# Patient Record
Sex: Male | Born: 1950
Health system: Southern US, Community
[De-identification: ages and names within clinical notes are randomized; demographics above are authoritative.]

## PROBLEM LIST (undated history)

## (undated) DIAGNOSIS — F1910 Other psychoactive substance abuse, uncomplicated: Secondary | ICD-10-CM

## (undated) DIAGNOSIS — I10 Essential (primary) hypertension: Secondary | ICD-10-CM

## (undated) DIAGNOSIS — G4733 Obstructive sleep apnea (adult) (pediatric): Secondary | ICD-10-CM

## (undated) DIAGNOSIS — I4891 Unspecified atrial fibrillation: Secondary | ICD-10-CM

## (undated) DIAGNOSIS — E0500 Thyrotoxicosis with diffuse goiter without thyrotoxic crisis or storm: Secondary | ICD-10-CM

## (undated) DIAGNOSIS — I509 Heart failure, unspecified: Secondary | ICD-10-CM

## (undated) DIAGNOSIS — E785 Hyperlipidemia, unspecified: Secondary | ICD-10-CM

## (undated) HISTORY — DX: Thyrotoxicosis with diffuse goiter without thyrotoxic crisis or storm: E05.00

## (undated) HISTORY — PX: APPENDECTOMY: SHX54

## (undated) HISTORY — DX: Heart failure, unspecified: I50.9

## (undated) HISTORY — PX: MENISCUS REPAIR: SHX5179

## (undated) HISTORY — PX: CARDIAC ELECTROPHYSIOLOGY STUDY AND ABLATION: SHX1294

## (undated) HISTORY — DX: Obstructive sleep apnea (adult) (pediatric): G47.33

## (undated) HISTORY — PX: VASECTOMY: SHX75

## (undated) HISTORY — PX: HERNIA REPAIR: SHX51

## (undated) HISTORY — DX: Essential (primary) hypertension: I10

## (undated) HISTORY — DX: Unspecified atrial fibrillation: I48.91

## (undated) HISTORY — DX: Hyperlipidemia, unspecified: E78.5

## (undated) HISTORY — DX: Other psychoactive substance abuse, uncomplicated: F19.10

---

## 2010-12-13 HISTORY — PX: BIV ICD GENERTAOR CHANGE OUT: SHX5745

## 2014-07-15 ENCOUNTER — Encounter: Payer: Self-pay | Admitting: Cardiovascular Disease

## 2014-07-15 ENCOUNTER — Ambulatory Visit (INDEPENDENT_AMBULATORY_CARE_PROVIDER_SITE_OTHER): Admitting: Cardiovascular Disease

## 2014-07-15 ENCOUNTER — Ambulatory Visit (INDEPENDENT_AMBULATORY_CARE_PROVIDER_SITE_OTHER): Admitting: *Deleted

## 2014-07-15 VITALS — BP 102/72 | HR 86 | Ht 73.0 in | Wt 303.2 lb

## 2014-07-15 DIAGNOSIS — Z720 Tobacco use: Secondary | ICD-10-CM

## 2014-07-15 DIAGNOSIS — I4891 Unspecified atrial fibrillation: Secondary | ICD-10-CM

## 2014-07-15 DIAGNOSIS — I4821 Permanent atrial fibrillation: Secondary | ICD-10-CM

## 2014-07-15 DIAGNOSIS — Z95810 Presence of automatic (implantable) cardiac defibrillator: Secondary | ICD-10-CM

## 2014-07-15 DIAGNOSIS — F1021 Alcohol dependence, in remission: Secondary | ICD-10-CM

## 2014-07-15 DIAGNOSIS — I5042 Chronic combined systolic (congestive) and diastolic (congestive) heart failure: Secondary | ICD-10-CM | POA: Insufficient documentation

## 2014-07-15 DIAGNOSIS — I420 Dilated cardiomyopathy: Secondary | ICD-10-CM

## 2014-07-15 DIAGNOSIS — I442 Atrioventricular block, complete: Secondary | ICD-10-CM

## 2014-07-15 DIAGNOSIS — Z79899 Other long term (current) drug therapy: Secondary | ICD-10-CM

## 2014-07-15 DIAGNOSIS — I428 Other cardiomyopathies: Secondary | ICD-10-CM

## 2014-07-15 DIAGNOSIS — I509 Heart failure, unspecified: Secondary | ICD-10-CM

## 2014-07-15 DIAGNOSIS — E890 Postprocedural hypothyroidism: Secondary | ICD-10-CM | POA: Insufficient documentation

## 2014-07-15 DIAGNOSIS — E782 Mixed hyperlipidemia: Secondary | ICD-10-CM

## 2014-07-15 DIAGNOSIS — E785 Hyperlipidemia, unspecified: Secondary | ICD-10-CM

## 2014-07-15 DIAGNOSIS — F17200 Nicotine dependence, unspecified, uncomplicated: Secondary | ICD-10-CM

## 2014-07-15 DIAGNOSIS — I480 Paroxysmal atrial fibrillation: Secondary | ICD-10-CM

## 2014-07-15 DIAGNOSIS — F1020 Alcohol dependence, uncomplicated: Secondary | ICD-10-CM

## 2014-07-15 DIAGNOSIS — Z9989 Dependence on other enabling machines and devices: Secondary | ICD-10-CM

## 2014-07-15 DIAGNOSIS — G4733 Obstructive sleep apnea (adult) (pediatric): Secondary | ICD-10-CM

## 2014-07-15 NOTE — Assessment & Plan Note (Signed)
He reports compliance with therapy.

## 2014-07-15 NOTE — Assessment & Plan Note (Signed)
He tells me that he has a scheduled appointment with an endocrinologist here in town and would like him to check his thyroid function tests. We will check his other baseline laboratory tests including an updated lipid profile, liver function tests and renal function

## 2014-07-15 NOTE — Assessment & Plan Note (Signed)
He is pacemaker dependent status post AV node ablation.

## 2014-07-15 NOTE — Assessment & Plan Note (Signed)
Appears to be very well compensated. No clinical evidence of hypervolemia, NYHA functional class IB. If his left ventricular ejection fraction has really improved so dramatically, I see little purpose for the combination of digoxin, especially since it has no value for ventricular rate control following AV node ablation. We'll try to obtain more complete records from [place] and will probably recommend discontinuation of this drug at his next appointment. He requires a low dose of loop diuretic for compensation and has not had acute heart failure exacerbation requiring dose adjustment her hospitalization.

## 2014-07-15 NOTE — Assessment & Plan Note (Signed)
Fairly noncommittal, he stated that he does not have a plan to quit smoking. When asked why the answer was my lungs are great". We'll have to slowly try to convince him of the importance of smoking cessation over his next visits.

## 2014-07-15 NOTE — Assessment & Plan Note (Signed)
Excellent LDL on current statin therapy. We will not see improvement in HDL without substantial weight loss.

## 2014-07-15 NOTE — Patient Instructions (Signed)
Your physician has requested that you have an echocardiogram. Echocardiography is a painless test that uses sound waves to create images of your heart. It provides your doctor with information about the size and shape of your heart and how well your heart's chambers and valves are working. This procedure takes approximately one hour. There are no restrictions for this procedure.  Your physician recommends that you return for lab work in: [place] at your convenience.  Dr. Sallyanne Kuster recommends that you schedule a follow-up appointment in: 6 MONTHS.

## 2014-07-15 NOTE — Assessment & Plan Note (Signed)
>>  ASSESSMENT AND PLAN FOR HYPOTHYROIDISM, POSTRADIOIODINE THERAPY WRITTEN ON 07/15/2014 10:57 PM BY Thurmon FairROITORU, MIHAI, MD  He tells me that he has a scheduled appointment with an endocrinologist here in town and would like him to check his thyroid function tests. We will check his other baseline laboratory tests including an updated lipid profile, liver function tests and renal function

## 2014-07-15 NOTE — Assessment & Plan Note (Signed)
>>  ASSESSMENT AND PLAN FOR DYSLIPIDEMIA (HIGH LDL; LOW HDL) WRITTEN ON 07/15/2014 10:56 PM BY CROITORU, MIHAI, MD  Excellent LDL on current statin therapy. We will not see improvement in HDL without substantial weight loss.

## 2014-07-15 NOTE — Progress Notes (Signed)
Patient ID: Brian Craig, male   DOB: Aug 08, 1951, 63 y.o.   MRN: [id number]      Reason for office visit Dilated cardiomyopathy, permanent atrial fibrillation, status post AV node ablation, CRT-D  [name] has recently relocated to [place] from [place]. He previously received cardiology care at [facility] where he was seen by Dr. Randol Kern (EP) and Dr. Lanelle Bal. 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  He describes his initial problem being Graves' disease from which he developed atrial fibrillation. His atrial fibrillation was difficult to control. He developed cardiomyopathy and he states that at one point his left ventricular ejection fraction was only 17% he had an unsuccessful cardioversion. Then, he underwent radioactive iodine thyroid ablation and then had another attempt at cardioversion which was also unsuccessful.   He received a CRT-D device in December 4315 (Medtronic Nixon, atrial lead Medtronic 5076, ICD lead Medtronic [phone] Sprint Quattro secure, LV lead Medtronic 4194 Attain bipolar). He underwent a generator change out in March 2012 for a Medtronic protecta [id number] device. The patient's notes describe him as having "excellent RV pacing threshold and progressive exit block in the left ventricular lead"  In 2009 he underwent AV node ablation. The notes say that his ejection fraction subsequently improved to 50-55%. [name] was that his most recent EF was 49% and that his last echocardiogram was performed roughly one year ago. His last notes from 2014 describing as having NYHA functional class IB-IIa. His cardiac medications including maximum dose of Coreg CR, moderate dose of ACE inhibitor (lisinopril 20), digoxin and he is on anticoagulation with Pradaxa.  He has obstructive sleep apnea compliant with treatment with VPAP. He takes medication for  depression, hyperlipidemia and erectile dysfunction as well as a thyroid supplement following his thyroid ablation. He does not have a history of stroke/TIA or other embolic events and does not have a history of bleeding complications.  He is a former alcoholic but still goes to [employer] and has been completely sober since 1984. He smokes roughly half a pack of cigarettes a day and has no intention of quitting  Interrogation of his CRT-D device today shows normal function. Battery voltage is 2.84 V (RRT 2.63 V) the last charge time of 11.2 seconds on March 21. The original leads are still in place. Atrial lead impedance 437 ohm, with the patient in permanent atrial fibrillation, sensed to the store he weighs 1.9 mV. Ventricular lead impedance 456 (high voltage 42/55) right ventricular threshold 0.75 V at 0.4 ms. Left ventricular lead impedance 646 ohms, threshold 1.25 V at 0.8 ms.  Patient activity appears to be constant over last year at approximately 2.5-3 hours a day. There is 98.5% biventricular pacing. There is 1.5 ventricular sensing. He is in permanent atrial fibrillation and there are no episodes of nonsustained or sustained ventricular tachycardia. I suspect the remainder of the 1.5% ventricular sensed events has to represent PVCs. There is no ventricular escape rhythm and pacing was taken down to 30 beats per minute.   No Known Allergies  Current Outpatient Prescriptions  Medication Sig Dispense Refill  . ALPRAZolam (XANAX) 0.5 MG tablet Take 0.5 mg by mouth 3 (three) times daily as needed for anxiety.      . carvedilol (COREG CR) 80 MG 24 hr capsule Take 80 mg by mouth daily.      . dabigatran (PRADAXA) 150 MG CAPS  capsule Take 150 mg by mouth 2 (two) times daily.      . digoxin (LANOXIN) 0.25 MG tablet Take 0.25 mg by mouth daily.      . furosemide (LASIX) 40 MG tablet Take 40 mg by mouth daily.      Marland Kitchen levothyroxine (SYNTHROID, LEVOTHROID) 200 MCG tablet Take 200 mcg by mouth daily  before breakfast.      . lisinopril (PRINIVIL,ZESTRIL) 10 MG tablet Take 10 mg by mouth 2 (two) times daily.      . Multiple Vitamin (MULTIVITAMIN) tablet Take 1 tablet by mouth daily.      . potassium chloride (K-DUR,KLOR-CON) 10 MEQ tablet Take 10 mEq by mouth daily.      . sertraline (ZOLOFT) 50 MG tablet Take 50 mg by mouth daily.      . simvastatin (ZOCOR) 40 MG tablet Take 40 mg by mouth daily.      . vardenafil (LEVITRA) 10 MG tablet Take 10 mg by mouth daily as needed for erectile dysfunction.       No current facility-administered medications for this visit.    Past Medical History  Diagnosis Date  . CHF (congestive heart failure)   . OSA treated with BiPAP   . Graves disease     Past Surgical History  Procedure Laterality Date  . Biv icd genertaor change out Left 2012    Medtronic Protecta    Family History  Problem Relation Age of Onset  . Heart disease Mother   . Heart disease Father   . Heart disease Paternal Aunt   . Heart disease Paternal Uncle     History   Social History  . Marital Status: Married    Spouse Name: N/A    Number of Children: N/A  . Years of Education: N/A   Occupational History  . Not on file.   Social History Main Topics  . Smoking status: Current Every Day Smoker -- 0.50 packs/day    Types: Cigarettes  . Smokeless tobacco: Not on file  . Alcohol Use: No  . Drug Use: No  . Sexual Activity: Not on file   Other Topics Concern  . Not on file   Social History Narrative  . No narrative on file    Review of systems: The patient specifically denies any chest pain at rest or with exertion, dyspnea at rest or with exertion, orthopnea, paroxysmal nocturnal dyspnea, syncope, palpitations, focal neurological deficits, intermittent claudication, lower extremity edema, unexplained weight gain, cough, hemoptysis or wheezing.  The patient also denies abdominal pain, nausea, vomiting, dysphagia, diarrhea, constipation, polyuria, polydipsia,  dysuria, hematuria, frequency, urgency, abnormal bleeding or bruising, fever, chills, unexpected weight changes, mood swings, change in skin or hair texture, change in voice quality, auditory or visual problems, allergic reactions or rashes, new musculoskeletal complaints other than usual "aches and pains".   PHYSICAL EXAM BP 102/72  Pulse 86  Ht 6\' 1"  (1.854 m)  Wt 303 lb 3.2 oz (137.531 kg)  BMI 40.01 kg/m2  General: Alert, oriented x3, no distress Head: no evidence of trauma, PERRL, EOMI, no exophtalmos or lid lag, no myxedema, no xanthelasma; normal ears, nose and oropharynx Neck: normal jugular venous pulsations and no hepatojugular reflux; brisk carotid pulses without delay and no carotid bruits Chest: clear to auscultation, no signs of consolidation by percussion or palpation, normal fremitus, symmetrical and full respiratory excursions, healthy left subclavian defibrillator site Cardiovascular: normal position and quality of the apical impulse, regular rhythm, normal first and fairly narrow  split second heart sounds, no murmurs, rubs or gallops Abdomen: no tenderness or distention, no masses by palpation, no abnormal pulsatility or arterial bruits, normal bowel sounds, no hepatosplenomegaly Extremities: no clubbing, cyanosis or edema; 2+ radial, ulnar and brachial pulses bilaterally; 2+ right femoral, posterior tibial and dorsalis pedis pulses; 2+ left femoral, posterior tibial and dorsalis pedis pulses; no subclavian or femoral bruits Neurological: grossly nonfocal   EKG: Atrial fibrillation, complete heart block, 100% ventricular pacing  Lipid Panel April 2015 Cholesterol 103, triglycerides 138, HDL 31, LDL 44  CMET April 2015 Sodium 140, potassium 4.4, glucose 86, creatinine 1.1, normal liver function tests Hemoglobin 14.8, platelets 204, white blood cell count 8.5   ASSESSMENT AND PLAN Chronic combined systolic and diastolic CHF, NYHA class 1 Appears to be very well  compensated. No clinical evidence of hypervolemia, NYHA functional class IB. If his left ventricular ejection fraction has really improved so dramatically, I see little purpose for the combination of digoxin, especially since it has no value for ventricular rate control following AV node ablation. We'll try to obtain more complete records from [place] and will probably recommend discontinuation of this drug at his next appointment. He requires a low dose of loop diuretic for compensation and has not had acute heart failure exacerbation requiring dose adjustment her hospitalization.  CHB (complete heart block) He is pacemaker dependent status post AV node ablation.  Congestive dilated cardiomyopathy I think it is important to reassess left ventricular ejection fraction and have a baseline echocardiogram here in [place]  Biventricular ICD  implanted 2007, generator change in 2012 Normal device function. No history of delivered appropriate or inappropriate defibrillator therapy. Fairly good percentage of biventricular pacing. No pertinent reprogramming her to be necessary today. Enrolled in [facility].  OSA on CPAP He reports compliance with therapy.  Tobacco abuse Fairly noncommittal, he stated that he does not have a plan to quit smoking. When asked why the answer was my lungs are great". We'll have to slowly try to convince him of the importance of smoking cessation over his next visits.  Dyslipidemia (high LDL; low HDL) Excellent LDL on current statin therapy. We will not see improvement in HDL without substantial weight loss.  Morbid obesity He is a tall and very large framed gentleman with strong bones and muscles but is also clearly obese. Weight loss would be beneficial.  Hypothyroidism, postradioiodine therapy He tells me that he has a scheduled appointment with an endocrinologist here in town and would like him to check his thyroid function tests. We will check his other baseline  laboratory tests including an updated lipid profile, liver function tests and renal function  Alcoholism in recovery He is congratulated for more than 30 years of abstinence.   Orders Placed This Encounter  Procedures  . Comprehensive metabolic panel  . Lipid panel  . EKG 12-Lead  . 2D Echocardiogram without contrast   Meds ordered this encounter  Medications  . potassium chloride (K-DUR,KLOR-CON) 10 MEQ tablet    Sig: Take 10 mEq by mouth daily.  . furosemide (LASIX) 40 MG tablet    Sig: Take 40 mg by mouth daily.  . sertraline (ZOLOFT) 50 MG tablet    Sig: Take 50 mg by mouth daily.  . simvastatin (ZOCOR) 40 MG tablet    Sig: Take 40 mg by mouth daily.  . carvedilol (COREG CR) 80 MG 24 hr capsule    Sig: Take 80 mg by mouth daily.  Marland Kitchen lisinopril (PRINIVIL,ZESTRIL) 10 MG tablet  Sig: Take 10 mg by mouth 2 (two) times daily.  . digoxin (LANOXIN) 0.25 MG tablet    Sig: Take 0.25 mg by mouth daily.  . dabigatran (PRADAXA) 150 MG CAPS capsule    Sig: Take 150 mg by mouth 2 (two) times daily.  Marland Kitchen ALPRAZolam (XANAX) 0.5 MG tablet    Sig: Take 0.5 mg by mouth 3 (three) times daily as needed for anxiety.  . vardenafil (LEVITRA) 10 MG tablet    Sig: Take 10 mg by mouth daily as needed for erectile dysfunction.  Marland Kitchen levothyroxine (SYNTHROID, LEVOTHROID) 200 MCG tablet    Sig: Take 200 mcg by mouth daily before breakfast.  . Multiple Vitamin (MULTIVITAMIN) tablet    Sig: Take 1 tablet by mouth daily.    Holli Humbles, MD, [place] [phone] office [phone] pager

## 2014-07-15 NOTE — Assessment & Plan Note (Signed)
He is congratulated for more than 30 years of abstinence.

## 2014-07-15 NOTE — Assessment & Plan Note (Signed)
He is a tall and very large framed gentleman with strong bones and muscles but is also clearly obese. Weight loss would be beneficial.

## 2014-07-15 NOTE — Assessment & Plan Note (Signed)
Normal device function. No history of delivered appropriate or inappropriate defibrillator therapy. Fairly good percentage of biventricular pacing. No pertinent reprogramming her to be necessary today. Enrolled in [facility].

## 2014-07-15 NOTE — Assessment & Plan Note (Signed)
I think it is important to reassess left ventricular ejection fraction and have a baseline echocardiogram here in [place]

## 2014-07-19 LAB — MDC_IDC_ENUM_SESS_TYPE_INCLINIC
Battery Voltage: 2.84 V
Brady Statistic AP VP Percent: 0 %
Brady Statistic AP VS Percent: 0 %
Brady Statistic AS VP Percent: 98.49 %
Brady Statistic AS VS Percent: 1.51 %
Brady Statistic RA Percent Paced: 0 %
Brady Statistic RV Percent Paced: 98.49 %
HIGH POWER IMPEDANCE MEASURED VALUE: 437 Ohm
HighPow Impedance: 171 Ohm
HighPow Impedance: 42 Ohm
HighPow Impedance: 55 Ohm
Lead Channel Impedance Value: 342 Ohm
Lead Channel Impedance Value: 437 Ohm
Lead Channel Impedance Value: 456 Ohm
Lead Channel Impedance Value: 817 Ohm
Lead Channel Pacing Threshold Amplitude: 0.75 V
Lead Channel Pacing Threshold Pulse Width: 0.8 ms
Lead Channel Sensing Intrinsic Amplitude: 1.625 mV
Lead Channel Sensing Intrinsic Amplitude: 5.875 mV
Lead Channel Setting Pacing Amplitude: 2.25 V
Lead Channel Setting Pacing Amplitude: 2.5 V
MDC IDC MSMT LEADCHNL LV IMPEDANCE VALUE: 646 Ohm
MDC IDC MSMT LEADCHNL LV PACING THRESHOLD AMPLITUDE: 1.25 V
MDC IDC MSMT LEADCHNL RA SENSING INTR AMPL: 1.875 mV
MDC IDC MSMT LEADCHNL RV PACING THRESHOLD PULSEWIDTH: 0.4 ms
MDC IDC SESS DTM: 20150803190121
MDC IDC SET LEADCHNL LV PACING PULSEWIDTH: 0.8 ms
MDC IDC SET LEADCHNL RV PACING PULSEWIDTH: 0.4 ms
MDC IDC SET LEADCHNL RV SENSING SENSITIVITY: 0.3 mV
MDC IDC SET ZONE DETECTION INTERVAL: 450 ms
Zone Setting Detection Interval: 300 ms
Zone Setting Detection Interval: 350 ms
Zone Setting Detection Interval: 400 ms

## 2014-07-19 NOTE — Progress Notes (Addendum)
CRT-D device check in office. Thresholds consistent with previous device measurements. Lead impedance trends stable over time. Permanent AF + Pradaxa. No ventricular arrhythmia episodes recorded. Patient bi-ventricularly pacing 98.5% of the time. Device programmed with appropriate safety margins. Audible alerts demonstrated for patient. No changes made this session. Batt voltage 2.84V (ERI 2.63V).  Patient will follow up via [facility] on 11-4 and with [facility] in 12 months.

## 2014-07-22 ENCOUNTER — Ambulatory Visit (HOSPITAL_COMMUNITY)
Admission: RE | Admit: 2014-07-22 | Discharge: 2014-07-22 | Disposition: A | Discharge status: Home / Self Care | Source: Ambulatory Visit | Attending: Cardiovascular Disease | Admitting: Cardiovascular Disease

## 2014-07-22 DIAGNOSIS — I369 Nonrheumatic tricuspid valve disorder, unspecified: Secondary | ICD-10-CM

## 2014-07-22 DIAGNOSIS — I4891 Unspecified atrial fibrillation: Secondary | ICD-10-CM | POA: Insufficient documentation

## 2014-07-22 DIAGNOSIS — I480 Paroxysmal atrial fibrillation: Secondary | ICD-10-CM

## 2014-07-22 LAB — COMPREHENSIVE METABOLIC PANEL
ALT: 15 U/L (ref 0–53)
AST: 15 U/L (ref 0–37)
Albumin: 4.3 g/dL (ref 3.5–5.2)
Alkaline Phosphatase: 72 U/L (ref 39–117)
BILIRUBIN TOTAL: 0.7 mg/dL (ref 0.2–1.2)
BUN: 8 mg/dL (ref 6–23)
CALCIUM: 9.4 mg/dL (ref 8.4–10.5)
CHLORIDE: 102 meq/L (ref 96–112)
CO2: 30 mEq/L (ref 19–32)
Creat: 0.93 mg/dL (ref 0.50–1.35)
GLUCOSE: 87 mg/dL (ref 70–99)
Potassium: 4 mEq/L (ref 3.5–5.3)
Sodium: 139 mEq/L (ref 135–145)
Total Protein: 6.7 g/dL (ref 6.0–8.3)

## 2014-07-22 LAB — LIPID PANEL
CHOL/HDL RATIO: 3.7 ratio
CHOLESTEROL: 110 mg/dL (ref 0–200)
HDL: 30 mg/dL — AB (ref >39)
LDL Cholesterol: 53 mg/dL (ref 0–99)
TRIGLYCERIDES: 134 mg/dL (ref <150)
VLDL: 27 mg/dL (ref 0–40)

## 2014-07-22 NOTE — Progress Notes (Signed)
2D Echocardiogram Complete.  07/22/2014   Cassondra Stachowski Buckhead, RDCS

## 2014-08-26 ENCOUNTER — Encounter: Payer: Self-pay | Admitting: Cardiovascular Disease

## 2014-10-21 ENCOUNTER — Ambulatory Visit (INDEPENDENT_AMBULATORY_CARE_PROVIDER_SITE_OTHER): Admitting: *Deleted

## 2014-10-21 DIAGNOSIS — I420 Dilated cardiomyopathy: Secondary | ICD-10-CM

## 2014-10-21 DIAGNOSIS — I5042 Chronic combined systolic (congestive) and diastolic (congestive) heart failure: Secondary | ICD-10-CM

## 2014-10-21 NOTE — Progress Notes (Signed)
Remote ICD transmission.   

## 2014-10-23 LAB — MDC_IDC_ENUM_SESS_TYPE_REMOTE
Battery Voltage: 2.71 V
Brady Statistic AP VP Percent: 0 %
Brady Statistic AP VS Percent: 0 %
Brady Statistic AS VP Percent: 99.23 %
Brady Statistic AS VS Percent: 0.77 %
HIGH POWER IMPEDANCE MEASURED VALUE: 342 Ohm
HIGH POWER IMPEDANCE MEASURED VALUE: 38 Ohm
HighPow Impedance: 47 Ohm
Lead Channel Impedance Value: 323 Ohm
Lead Channel Impedance Value: 646 Ohm
Lead Channel Setting Pacing Amplitude: 2.25 V
Lead Channel Setting Pacing Amplitude: 2.5 V
Lead Channel Setting Pacing Pulse Width: 0.4 ms
Lead Channel Setting Pacing Pulse Width: 0.8 ms
Lead Channel Setting Sensing Sensitivity: 0.3 mV
MDC IDC MSMT LEADCHNL LV IMPEDANCE VALUE: 456 Ohm
MDC IDC MSMT LEADCHNL RA IMPEDANCE VALUE: 399 Ohm
MDC IDC MSMT LEADCHNL RA SENSING INTR AMPL: 0.875 mV
MDC IDC MSMT LEADCHNL RV IMPEDANCE VALUE: 342 Ohm
MDC IDC SESS DTM: 20151109152207
MDC IDC SET ZONE DETECTION INTERVAL: 300 ms
MDC IDC STAT BRADY RA PERCENT PACED: 0 %
MDC IDC STAT BRADY RV PERCENT PACED: 99.23 %
Zone Setting Detection Interval: 350 ms
Zone Setting Detection Interval: 400 ms
Zone Setting Detection Interval: 450 ms

## 2014-11-01 ENCOUNTER — Encounter: Payer: Self-pay | Admitting: Cardiology

## 2014-11-06 ENCOUNTER — Encounter: Payer: Self-pay | Admitting: Cardiovascular Disease

## 2015-01-30 ENCOUNTER — Telehealth: Payer: Self-pay | Admitting: Cardiovascular Disease

## 2015-01-30 MED ORDER — LISINOPRIL 10 MG PO TABS: 10.0000 mg | ORAL_TABLET | Freq: Two times a day (BID) | ORAL | Status: DC

## 2015-01-30 NOTE — Telephone Encounter (Signed)
Rx refilled.

## 2015-01-30 NOTE — Telephone Encounter (Signed)
°  1. Which medications need to be refilled?Lisinopril 10mg   2. Which pharmacy is medication to be sent to?[facility]   3. Do they need a 30 day or 90 day supply? 90  4. Would they like a call back once the medication has been sent to the pharmacy? no

## 2015-03-10 ENCOUNTER — Encounter: Payer: Self-pay | Admitting: Cardiovascular Disease

## 2015-03-10 ENCOUNTER — Ambulatory Visit (INDEPENDENT_AMBULATORY_CARE_PROVIDER_SITE_OTHER): Admitting: Cardiovascular Disease

## 2015-03-10 VITALS — BP 118/77 | HR 83 | Resp 16 | Ht 73.0 in | Wt 319.4 lb

## 2015-03-10 DIAGNOSIS — Z95810 Presence of automatic (implantable) cardiac defibrillator: Secondary | ICD-10-CM | POA: Diagnosis not present

## 2015-03-10 DIAGNOSIS — I442 Atrioventricular block, complete: Secondary | ICD-10-CM | POA: Diagnosis not present

## 2015-03-10 DIAGNOSIS — I482 Chronic atrial fibrillation: Secondary | ICD-10-CM

## 2015-03-10 DIAGNOSIS — I4821 Permanent atrial fibrillation: Secondary | ICD-10-CM

## 2015-03-10 DIAGNOSIS — I5042 Chronic combined systolic (congestive) and diastolic (congestive) heart failure: Secondary | ICD-10-CM | POA: Diagnosis not present

## 2015-03-10 NOTE — Progress Notes (Signed)
Patient ID: Brian Craig, male   DOB: 12-03-1951, 64 y.o.   MRN: [id number]     Cardiology Office Note   Date:  03/10/2015   ID:  Brian Craig, DOB December 08, 1951, MRN [id number]  PCP:  Gerilyn Pilgrim, FNP  Cardiologist:   Sanda Klein, MD   Chief Complaint  Patient presents with  . Follow-up    6 months:  No complaints of chest pain, SOB or edema.  Occas.  positional dizziness.      History of Present Illness: Brian Craig is a 64 y.o. male who presents for Dilated cardiomyopathy, permanent atrial fibrillation, status post AV node ablation, CRT-D  Brian Craig has a history of severe dilated cardiomyopathy related to uncontrolled atrial fibrillation with rapid ventricular response that started in the setting of thyrotoxicosis. His left ventricular ejection fraction was as low as 17%. Cardioversion failed. Eventually underwent AV node ablation and received a biventricular pacemaker-defibrillator in 2007. He underwent a generator change out in 2012 (atrial lead Medtronic 5076, ICD lead Medtronic [phone] Sprint Quattro secure, LV lead Medtronic 4194 Attain bipolar, Medtronic protecta D334TRG device). His left ventricular ejection fraction has improved substantially since AV node ablation. His most recent echocardiogram performed in August 2015 shows normal left ventricular systolic function. He remains in permanent atrial fibrillation.  He has no complaints whatsoever today. He exercises with some regularity. He rarely develops worsening edema and takes an extra furosemide no more than once or twice every 6 months.  Interrogation of his device shows that it has almost reached elective replacement indicator. Battery voltage is 2.63 V, right at the RRT threshold. He has 99% biventricular pacing. He performs remote  He has obstructive sleep apnea compliant with treatment with CPAP. He takes medication for depression, hyperlipidemia and erectile dysfunction as well as a thyroid supplement following his  thyroid ablation. He does not have a history of stroke/TIA or other embolic events and does not have a history of bleeding complications.  Brian Craig has recently relocated to [place] from [place]. He previously received cardiology care at [facility] cardiology where he was seen by Dr. Randol Kern (EP) and Dr. Lanelle Bal. 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 He is a former alcoholic but still goes to [employer] and has been completely sober since 1984. He smokes roughly half a pack of cigarettes a day and has no intention of quitting    Past Medical History  Diagnosis Date  . CHF (congestive heart failure)   . OSA treated with BiPAP   . Graves disease     Past Surgical History  Procedure Laterality Date  . Biv icd genertaor change out Left 2012    Medtronic Protecta     Current Outpatient Prescriptions  Medication Sig Dispense Refill  . ALPRAZolam (XANAX) 0.5 MG tablet Take 0.5 mg by mouth 3 (three) times daily as needed for anxiety.    . carvedilol (COREG CR) 80 MG 24 hr capsule Take 80 mg by mouth daily.    . dabigatran (PRADAXA) 150 MG CAPS capsule Take 150 mg by mouth 2 (two) times daily.    . digoxin (LANOXIN) 0.25 MG tablet Take 0.25 mg by mouth daily.    . furosemide (LASIX) 40 MG tablet Take 40 mg by mouth daily.    Marland Kitchen levothyroxine (SYNTHROID, LEVOTHROID) 200 MCG tablet Take 200 mcg by mouth daily before breakfast.    . lisinopril (PRINIVIL,ZESTRIL) 10  MG tablet Take 1 tablet (10 mg total) by mouth 2 (two) times daily. 180 tablet 1  . Multiple Vitamin (MULTIVITAMIN) tablet Take 1 tablet by mouth daily.    . potassium chloride (K-DUR,KLOR-CON) 10 MEQ tablet Take 10 mEq by mouth daily.    . sertraline (ZOLOFT) 50 MG tablet Take 50 mg by mouth daily.    . simvastatin (ZOCOR) 40 MG tablet Take 40 mg by mouth daily.    . vardenafil (LEVITRA) 10 MG tablet Take 10  mg by mouth daily as needed for erectile dysfunction.     No current facility-administered medications for this visit.    Allergies:   Review of patient's allergies indicates no known allergies.    Social History:  The patient  reports that he has been smoking Cigarettes.  He has been smoking about 0.50 packs per day. He does not have any smokeless tobacco history on file. He reports that he does not drink alcohol or use illicit drugs.   Family History:  The patient's family history includes Heart disease in his father, mother, paternal aunt, and paternal uncle.    ROS:  Please see the history of present illness.    The patient specifically denies any chest pain at rest or with exertion, dyspnea at rest or with exertion, orthopnea, paroxysmal nocturnal dyspnea, syncope, palpitations, focal neurological deficits, intermittent claudication, lower extremity edema, unexplained weight gain, cough, hemoptysis or wheezing.  The patient also denies abdominal pain, nausea, vomiting, dysphagia, diarrhea, constipation, polyuria, polydipsia, dysuria, hematuria, frequency, urgency, abnormal bleeding or bruising, fever, chills, unexpected weight changes, mood swings, change in skin or hair texture, change in voice quality, auditory or visual problems, allergic reactions or rashes, new musculoskeletal complaints other than usual "aches and pains".  Otherwise, review of systems positive for none.   All other systems are reviewed and negative.    PHYSICAL EXAM: VS:  BP 118/77 mmHg  Pulse 83  Resp 16  Ht 6\' 1"  (1.854 m)  Wt 319 lb 6.4 oz (144.879 kg)  BMI 42.15 kg/m2 , BMI Body mass index is 42.15 kg/(m^2).  General: Alert, oriented x3, no distress Head: no evidence of trauma, PERRL, EOMI, no exophtalmos or lid lag, no myxedema, no xanthelasma; normal ears, nose and oropharynx Neck: normal jugular venous pulsations and no hepatojugular reflux; brisk carotid pulses without delay and no carotid  bruits Chest: clear to auscultation, no signs of consolidation by percussion or palpation, normal fremitus, symmetrical and full respiratory excursions, healthy left subclavian defibrillator site Cardiovascular: normal position and quality of the apical impulse, regular rhythm, normal first and fairly narrow split second heart sounds, no murmurs, rubs or gallops Abdomen: no tenderness or distention, no masses by palpation, no abnormal pulsatility or arterial bruits, normal bowel sounds, no hepatosplenomegaly Extremities: no clubbing, cyanosis or edema; 2+ radial, ulnar and brachial pulses bilaterally; 2+ right femoral, posterior tibial and dorsalis pedis pulses; 2+ left femoral, posterior tibial and dorsalis pedis pulses; no subclavian or femoral bruits Neurological: grossly nonfocal Psych: euthymic mood, full affect   EKG:  EKG is ordered today. The ekg ordered today demonstrates atrial fibrillation, biventricular pacing with dominant R wave in lead V1   Recent Labs: 07/22/2014: ALT 15; BUN 8; Creatinine 0.93; Potassium 4.0; Sodium 139    Lipid Panel    Component Value Date/Time   CHOL 110 07/22/2014 0939   TRIG 134 07/22/2014 0939   HDL 30* 07/22/2014 0939   CHOLHDL 3.7 07/22/2014 0939   VLDL 27 07/22/2014 0939  LDLCALC 53 07/22/2014 0939      Wt Readings from Last 3 Encounters:  03/10/15 319 lb 6.4 oz (144.879 kg)  07/15/14 303 lb 3.2 oz (137.531 kg)     ASSESSMENT AND PLAN:  Chronic combined systolic and diastolic CHF, NYHA class 1 Appears to be very well compensated. No clinical evidence of hypervolemia, NYHA functional class IB. He requires a low dose of loop diuretic for compensation and has not had acute heart failure exacerbation requiring dose adjustment her hospitalization. I would recommend permanent discontinuation of his digoxin. He seems to be reluctant to stop the medication that he has taken for a while. We'll try to convince him that it is not really necessary  and may actually be deleterious.  CHB (complete heart block) He is pacemaker dependent status post AV node ablation.  Congestive dilated cardiomyopathy Complete recovery of left ventricular systolic function confirms that he primarily had tachycardia related cardiomyopathy.  Biventricular ICD implanted 2007, generator change in 2012 Normal device function, but very close to elective replacement indicator. He is device dependent. Will perform monthly checks. Anticipate he will reach ERI in the next couple of months. We'll check labs at the time of his device change in. No history of delivered appropriate or inappropriate defibrillator therapy. Excellent percentage of biventricular pacing. Offered option for "downgraded" to CRT-P, but he prefers the security of a defibrillator and cases cardiomyopathy recurs.  OSA on CPAP He reports compliance with therapy.  Tobacco abuse He stated that he does not have a plan to quit smoking. We'll have to slowly try to convince him of the importance of smoking cessation over his next visits.  Dyslipidemia (high LDL; low HDL) Excellent LDL on current statin therapy. We will not see improvement in HDL without substantial weight loss.  Morbid obesity He is a tall and very large framed gentleman with strong bones and muscles but is also clearly obese. Weight loss would be beneficial.  Hypothyroidism, postradioiodine therapy On hormone replacement therapy  Alcoholism in recovery He is congratulated for more than 30 years of abstinence.   Current medicines are reviewed at length with the patient today.  The patient does not have concerns regarding medicines.  The following changes have been made:  no change  Labs/ tests ordered today include:  Orders Placed This Encounter  Procedures  . EKG 12-Lead   Patient Instructions  Remote monitoring is used to monitor your ICD from home. This monitoring reduces the number of office visits required to check  your device to one time per year. It allows [place] to keep an eye on the functioning of your device to ensure it is working properly. You are scheduled for a device check from home on 04/10/2015. You may send your transmission at any time that day. If you have a wireless device, the transmission will be sent automatically. After your physician reviews your transmission, you will receive a postcard with your next transmission date.  Your physician recommends that you schedule a follow-up appointment in: 6 months with Dr.Garyn Waguespack    Signed, Sanda Klein, MD  03/10/2015 2:37 PM    Sanda Klein, MD, [facility] [phone] office [phone] pager

## 2015-03-10 NOTE — Patient Instructions (Signed)
Remote monitoring is used to monitor your ICD from home. This monitoring reduces the number of office visits required to check your device to one time per year. It allows [place] to keep an eye on the functioning of your device to ensure it is working properly. You are scheduled for a device check from home on 04/10/2015. You may send your transmission at any time that day. If you have a wireless device, the transmission will be sent automatically. After your physician reviews your transmission, you will receive a postcard with your next transmission date.  Your physician recommends that you schedule a follow-up appointment in: 6 months with Dr.Croitoru

## 2015-03-13 LAB — MDC_IDC_ENUM_SESS_TYPE_INCLINIC
Battery Voltage: 2.63 V
Brady Statistic RA Percent Paced: 0 %
Brady Statistic RV Percent Paced: 98.86 %
Date Time Interrogation Session: 20160328125832
HIGH POWER IMPEDANCE MEASURED VALUE: 304 Ohm
HIGH POWER IMPEDANCE MEASURED VALUE: 38 Ohm
HighPow Impedance: 45 Ohm
Lead Channel Impedance Value: 342 Ohm
Lead Channel Impedance Value: 342 Ohm
Lead Channel Impedance Value: 380 Ohm
Lead Channel Setting Pacing Amplitude: 2.25 V
Lead Channel Setting Pacing Amplitude: 2.5 V
Lead Channel Setting Pacing Pulse Width: 0.4 ms
Lead Channel Setting Pacing Pulse Width: 0.8 ms
Lead Channel Setting Sensing Sensitivity: 0.3 mV
MDC IDC MSMT LEADCHNL LV IMPEDANCE VALUE: 513 Ohm
MDC IDC MSMT LEADCHNL LV IMPEDANCE VALUE: 722 Ohm
MDC IDC MSMT LEADCHNL RA SENSING INTR AMPL: 0.875 mV
MDC IDC MSMT LEADCHNL RA SENSING INTR AMPL: 0.875 mV
MDC IDC MSMT LEADCHNL RV SENSING INTR AMPL: 5.875 mV
MDC IDC SET ZONE DETECTION INTERVAL: 300 ms
MDC IDC SET ZONE DETECTION INTERVAL: 400 ms
MDC IDC STAT BRADY AP VP PERCENT: 0 %
MDC IDC STAT BRADY AP VS PERCENT: 0 %
MDC IDC STAT BRADY AS VP PERCENT: 98.86 %
MDC IDC STAT BRADY AS VS PERCENT: 1.14 %
Zone Setting Detection Interval: 350 ms
Zone Setting Detection Interval: 450 ms

## 2015-03-14 ENCOUNTER — Encounter: Payer: Self-pay | Admitting: Cardiovascular Disease

## 2015-04-10 ENCOUNTER — Ambulatory Visit (INDEPENDENT_AMBULATORY_CARE_PROVIDER_SITE_OTHER): Admitting: *Deleted

## 2015-04-10 DIAGNOSIS — Z95810 Presence of automatic (implantable) cardiac defibrillator: Secondary | ICD-10-CM

## 2015-04-10 NOTE — Progress Notes (Signed)
Remote ICD transmission.   

## 2015-04-11 LAB — MDC_IDC_ENUM_SESS_TYPE_REMOTE
Brady Statistic AP VP Percent: 0 %
Brady Statistic AS VS Percent: 1.25 %
Brady Statistic RV Percent Paced: 98.75 %
HighPow Impedance: 342 Ohm
HighPow Impedance: 43 Ohm
HighPow Impedance: 52 Ohm
Lead Channel Impedance Value: 380 Ohm
Lead Channel Impedance Value: 380 Ohm
Lead Channel Impedance Value: 380 Ohm
Lead Channel Impedance Value: 589 Ohm
Lead Channel Impedance Value: 836 Ohm
Lead Channel Sensing Intrinsic Amplitude: 1.25 mV
Lead Channel Sensing Intrinsic Amplitude: 1.25 mV
Lead Channel Setting Pacing Amplitude: 2.25 V
Lead Channel Setting Pacing Amplitude: 2.5 V
Lead Channel Setting Pacing Pulse Width: 0.4 ms
MDC IDC MSMT BATTERY VOLTAGE: 2.62 V
MDC IDC MSMT LEADCHNL RV SENSING INTR AMPL: 5.875 mV
MDC IDC SESS DTM: 20160428160338
MDC IDC SET LEADCHNL LV PACING PULSEWIDTH: 0.8 ms
MDC IDC SET LEADCHNL RV SENSING SENSITIVITY: 0.3 mV
MDC IDC SET ZONE DETECTION INTERVAL: 350 ms
MDC IDC STAT BRADY AP VS PERCENT: 0 %
MDC IDC STAT BRADY AS VP PERCENT: 98.75 %
MDC IDC STAT BRADY RA PERCENT PACED: 0 %
Zone Setting Detection Interval: 300 ms
Zone Setting Detection Interval: 400 ms
Zone Setting Detection Interval: 450 ms

## 2015-04-14 ENCOUNTER — Telehealth: Payer: Self-pay | Admitting: Cardiovascular Disease

## 2015-04-14 NOTE — Telephone Encounter (Signed)
Informed pt that device has reached RRT. Informed pt that once his device alarm's this alert he has 3 months until EOL. Informed pt that it will alert every day at the same time for 16 days. If he wants he can come to office and have alert turned off. Pt wants appt ASAP b/c he his dependent. Pt aware that a scheduler will call him to schedule him w/ MD.

## 2015-04-14 NOTE — Telephone Encounter (Signed)
New Message     Patient needs a phone call in regards to his pace maker going off every am and him having to download things all over again. Please give patient a call.

## 2015-04-16 ENCOUNTER — Ambulatory Visit (INDEPENDENT_AMBULATORY_CARE_PROVIDER_SITE_OTHER): Admitting: Cardiovascular Disease

## 2015-04-16 ENCOUNTER — Encounter: Payer: Self-pay | Admitting: Cardiovascular Disease

## 2015-04-16 VITALS — BP 100/60 | HR 72 | Ht 73.0 in | Wt 299.3 lb

## 2015-04-16 DIAGNOSIS — Z01812 Encounter for preprocedural laboratory examination: Secondary | ICD-10-CM | POA: Diagnosis not present

## 2015-04-16 DIAGNOSIS — Z4502 Encounter for adjustment and management of automatic implantable cardiac defibrillator: Secondary | ICD-10-CM | POA: Diagnosis not present

## 2015-04-16 DIAGNOSIS — I482 Chronic atrial fibrillation: Secondary | ICD-10-CM | POA: Diagnosis not present

## 2015-04-16 DIAGNOSIS — I4821 Permanent atrial fibrillation: Secondary | ICD-10-CM

## 2015-04-16 DIAGNOSIS — Z79899 Other long term (current) drug therapy: Secondary | ICD-10-CM | POA: Diagnosis not present

## 2015-04-16 DIAGNOSIS — G4733 Obstructive sleep apnea (adult) (pediatric): Secondary | ICD-10-CM

## 2015-04-16 DIAGNOSIS — R5383 Other fatigue: Secondary | ICD-10-CM

## 2015-04-16 DIAGNOSIS — E785 Hyperlipidemia, unspecified: Secondary | ICD-10-CM

## 2015-04-16 DIAGNOSIS — I442 Atrioventricular block, complete: Secondary | ICD-10-CM

## 2015-04-16 DIAGNOSIS — Z9989 Dependence on other enabling machines and devices: Secondary | ICD-10-CM

## 2015-04-16 NOTE — Patient Instructions (Signed)
Your physician has recommended that you have your defibrillator battery changed out.   Your physician recommends that you return for lab work in: 05/01/15 at [facility] lab.

## 2015-04-17 DIAGNOSIS — Z4502 Encounter for adjustment and management of automatic implantable cardiac defibrillator: Secondary | ICD-10-CM | POA: Insufficient documentation

## 2015-04-17 NOTE — Progress Notes (Signed)
Patient ID: Legion Discher, male   DOB: 1951-08-19, 64 y.o.   MRN: [id number]      Cardiology Office Note   Date:  04/17/2015   ID:  [name], DOB 1951-08-21, MRN [id number]  PCP:  Gerilyn Pilgrim, FNP  Cardiologist:   Sanda Klein, MD   Chief Complaint  Patient presents with  . CRT-D device at RRT    patient reports shortness of breath on exertion, occasional ankle edema, and lightheadedness when he stands up.      History of Present Illness: Helmut Hennon is a 64 y.o. male who presents after his generator alarmed and remote download showed that the device has reached ERI.   He feels well, has mild exertional dyspnea and edema if he does not take furosemide. No embolic or bleeding complications  He would like to schedule device changeout later in May and we agreed on May 24. As before, he prefers the "added security" of an ICD to a CRT-P downgrade.  Mr. Zellars has a history of severe dilated cardiomyopathy related to uncontrolled atrial fibrillation with rapid ventricular response that started in the setting of thyrotoxicosis. His left ventricular ejection fraction was as low as 17%. Cardioversion failed. Eventually underwent AV node ablation and received a biventricular pacemaker-defibrillator in 2007. He underwent a generator change out in 2012 (atrial lead Medtronic 5076, ICD lead Medtronic [phone] Sprint Quattro secure, LV lead Medtronic 4194 Attain bipolar, Medtronic protecta [id number] device). His left ventricular ejection fraction has improved substantially since AV node ablation. His most recent echocardiogram performed in August 2015 shows normal left ventricular systolic function. He remains in permanent atrial fibrillation.  He has obstructive sleep apnea compliant with treatment with CPAP. He takes medication for depression, hyperlipidemia and erectile dysfunction as well as a thyroid supplement following his thyroid ablation. He does not have a history of stroke/TIA or other  embolic events and does not have a history of bleeding complications.  Mr. Bendickson has recently relocated to [place] from [place]. He previously received cardiology care at [facility] cardiology where he was seen by Dr. Randol Kern (EP) and Dr. Lanelle Bal. 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 He is a former alcoholic but still goes to [employer] and has been completely sober since 1984. He smokes roughly half a pack of cigarettes a day and has no intention of quitting  Past Medical History  Diagnosis Date  . CHF (congestive heart failure)   . OSA treated with BiPAP   . Graves disease     Past Surgical History  Procedure Laterality Date  . Biv icd genertaor change out Left 2012    Medtronic Protecta     Current Outpatient Prescriptions  Medication Sig Dispense Refill  . ALPRAZolam (XANAX) 0.5 MG tablet Take 0.5 mg by mouth 3 (three) times daily as needed for anxiety.    . carvedilol (COREG CR) 80 MG 24 hr capsule Take 80 mg by mouth daily.    . dabigatran (PRADAXA) 150 MG CAPS capsule Take 150 mg by mouth 2 (two) times daily.    . digoxin (LANOXIN) 0.25 MG tablet Take 0.25 mg by mouth daily.    . furosemide (LASIX) 40 MG tablet Take 40 mg by mouth daily.    Marland Kitchen levothyroxine (SYNTHROID, LEVOTHROID) 200 MCG tablet Take 200 mcg by mouth daily before breakfast.    . lisinopril (PRINIVIL,ZESTRIL) 10 MG tablet Take 1 tablet (10  mg total) by mouth 2 (two) times daily. 180 tablet 1  . Multiple Vitamin (MULTIVITAMIN) tablet Take 1 tablet by mouth daily.    . potassium chloride (K-DUR,KLOR-CON) 10 MEQ tablet Take 10 mEq by mouth daily.    . sertraline (ZOLOFT) 50 MG tablet Take 50 mg by mouth daily.    . simvastatin (ZOCOR) 40 MG tablet Take 40 mg by mouth daily.    . vardenafil (LEVITRA) 10 MG tablet Take 10 mg by mouth daily as needed for erectile dysfunction.     No current  facility-administered medications for this visit.    Allergies:   Review of patient's allergies indicates no known allergies.    Social History:  The patient  reports that he has been smoking Cigarettes.  He has been smoking about 0.50 packs per day. He does not have any smokeless tobacco history on file. He reports that he does not drink alcohol or use illicit drugs.   Family History:  The patient's family history includes Heart disease in his father, mother, paternal aunt, and paternal uncle.    ROS:  Please see the history of present illness.    Otherwise, review of systems positive for none.   All other systems are reviewed and negative.    PHYSICAL EXAM: VS:  BP 100/60 mmHg  Pulse 72  Ht 6\' 1"  (1.854 m)  Wt 299 lb 4.8 oz (135.762 kg)  BMI 39.50 kg/m2 , BMI Body mass index is 39.5 kg/(m^2).  General: Alert, oriented x3, no distress Head: no evidence of trauma, PERRL, EOMI, no exophtalmos or lid lag, no myxedema, no xanthelasma; normal ears, nose and oropharynx Neck: normal jugular venous pulsations and no hepatojugular reflux; brisk carotid pulses without delay and no carotid bruits Chest: clear to auscultation, no signs of consolidation by percussion or palpation, normal fremitus, symmetrical and full respiratory excursions Cardiovascular: normal position and quality of the apical impulse, regular rhythm, normal first and second heart sounds, no murmurs, rubs or gallops Abdomen: no tenderness or distention, no masses by palpation, no abnormal pulsatility or arterial bruits, normal bowel sounds, no hepatosplenomegaly Extremities: no clubbing, cyanosis or edema; 2+ radial, ulnar and brachial pulses bilaterally; 2+ right femoral, posterior tibial and dorsalis pedis pulses; 2+ left femoral, posterior tibial and dorsalis pedis pulses; no subclavian or femoral bruits Neurological: grossly nonfocal Psych: euthymic mood, full affect   EKG:  EKG is ordered today. The ekg ordered today  demonstrates atrial fibrillation, V paced   Recent Labs: 07/22/2014: ALT 15; BUN 8; Creatinine 0.93; Potassium 4.0; Sodium 139    Lipid Panel    Component Value Date/Time   CHOL 110 07/22/2014 0939   TRIG 134 07/22/2014 0939   HDL 30* 07/22/2014 0939   CHOLHDL 3.7 07/22/2014 0939   VLDL 27 07/22/2014 0939   LDLCALC 53 07/22/2014 0939      Wt Readings from Last 3 Encounters:  04/16/15 299 lb 4.8 oz (135.762 kg)  03/10/15 319 lb 6.4 oz (144.879 kg)  07/15/14 303 lb 3.2 oz (137.531 kg)      Other studies Reviewed:   ASSESSMENT AND PLAN:  Previous device check showed good lead parameters. He is pacemaker dependent after AV node ablation. Discussed generator change at length. This procedure has been fully reviewed with the patient and informed consent has been obtained.  Chronic combined systolic and diastolic CHF, NYHA class 1 Appears to be very well compensated. No clinical evidence of hypervolemia, NYHA functional class IB. He requires a low dose of  loop diuretic for compensation and has not had acute heart failure exacerbation requiring dose adjustment her hospitalization. Still need to convince him that it is not really necessary to take digoxin and that it may actually be deleterious.  CHB (complete heart block) He is pacemaker dependent status post AV node ablation.  Congestive dilated cardiomyopathy Complete recovery of left ventricular systolic function confirms that he primarily had tachycardia related cardiomyopathy.  Biventricular ICD implanted 2007, generator change in 2012 Normal device function, at last check. No history of delivered appropriate or inappropriate defibrillator therapy. Excellent percentage of biventricular pacing. Offered option for "downgraded" to CRT-P, but he prefers the security of a defibrillator in case cardiomyopathy recurs.  OSA on CPAP He reports compliance with therapy.  Tobacco abuse He stated that he does not have a plan to  quit smoking. We'll have to slowly try to convince him of the importance of smoking cessation over his next visits.  Dyslipidemia (high LDL; low HDL) Excellent LDL on current statin therapy. We will not see improvement in HDL without substantial weight loss.  Morbid obesity He is a tall and very large framed gentleman with strong bones and muscles but is also clearly obese. Weight loss would be beneficial.  Hypothyroidism, postradioiodine therapy On hormone replacement therapy  Alcoholism in recovery He is congratulated for more than 30 years of abstinence. Current medicines are reviewed at length with the patient today.  The patient does not have concerns regarding medicines.  The following changes have been made:  no change  Labs/ tests ordered today include:  Orders Placed This Encounter  Procedures  . APTT  . Protime-INR  . CBC  . Comprehensive metabolic panel  . Lipid panel  . EKG 12-Lead  . IMPLANTABLE CARDIOVERTER DEFIBRILLATOR (ICD) GENERATOR CHANGE   Patient Instructions  Your physician has recommended that you have your defibrillator battery changed out.   Your physician recommends that you return for lab work in: 05/01/15 at [facility] lab.       Mikael Spray, MD  04/17/2015 10:10 PM    Sanda Klein, MD, [facility] [phone] office [phone] pager

## 2015-04-22 ENCOUNTER — Encounter: Payer: Self-pay | Admitting: Cardiology

## 2015-04-23 ENCOUNTER — Other Ambulatory Visit: Payer: Self-pay | Admitting: *Deleted

## 2015-04-23 DIAGNOSIS — Z4502 Encounter for adjustment and management of automatic implantable cardiac defibrillator: Secondary | ICD-10-CM

## 2015-04-24 ENCOUNTER — Encounter: Payer: Self-pay | Admitting: Internal Medicine

## 2015-04-30 LAB — CBC
HCT: 43.3 % (ref 39.0–52.0)
HEMOGLOBIN: 15.1 g/dL (ref 13.0–17.0)
MCH: 32.5 pg (ref 26.0–34.0)
MCHC: 34.9 g/dL (ref 30.0–36.0)
MCV: 93.1 fL (ref 78.0–100.0)
MPV: 10.5 fL (ref 8.6–12.4)
PLATELETS: 230 10*3/uL (ref 150–400)
RBC: 4.65 MIL/uL (ref 4.22–5.81)
RDW: 13 % (ref 11.5–15.5)
WBC: 8.2 10*3/uL (ref 4.0–10.5)

## 2015-05-01 LAB — LIPID PANEL
CHOL/HDL RATIO: 3.4 ratio
Cholesterol: 98 mg/dL (ref 0–200)
HDL: 29 mg/dL — AB (ref >=40)
LDL CALC: 39 mg/dL (ref 0–99)
Triglycerides: 148 mg/dL (ref <150)
VLDL: 30 mg/dL (ref 0–40)

## 2015-05-01 LAB — COMPREHENSIVE METABOLIC PANEL
ALBUMIN: 4 g/dL (ref 3.5–5.2)
ALT: 15 U/L (ref 0–53)
AST: 15 U/L (ref 0–37)
Alkaline Phosphatase: 63 U/L (ref 39–117)
BILIRUBIN TOTAL: 0.7 mg/dL (ref 0.2–1.2)
BUN: 8 mg/dL (ref 6–23)
CO2: 27 mEq/L (ref 19–32)
CREATININE: 0.69 mg/dL (ref 0.50–1.35)
Calcium: 9.3 mg/dL (ref 8.4–10.5)
Chloride: 103 mEq/L (ref 96–112)
GLUCOSE: 102 mg/dL — AB (ref 70–99)
POTASSIUM: 4.3 meq/L (ref 3.5–5.3)
Sodium: 139 mEq/L (ref 135–145)
Total Protein: 6.7 g/dL (ref 6.0–8.3)

## 2015-05-01 LAB — PROTIME-INR
INR: 1.15 (ref <1.50)
PROTHROMBIN TIME: 14.7 s (ref 11.6–15.2)

## 2015-05-01 LAB — APTT: APTT: 44 s — AB (ref 24–37)

## 2015-05-05 ENCOUNTER — Telehealth: Payer: Self-pay | Admitting: Cardiology

## 2015-05-05 ENCOUNTER — Encounter: Admitting: *Deleted

## 2015-05-05 MED ORDER — DEXTROSE 5 % IV SOLN
3.0000 g | INTRAVENOUS | Status: AC
  Administered 2015-05-06: 3 g via INTRAVENOUS
  Filled 2015-05-05: qty 3000

## 2015-05-05 MED ORDER — SODIUM CHLORIDE 0.9 % IR SOLN
80.0000 mg | Status: DC
  Filled 2015-05-05: qty 2

## 2015-05-05 NOTE — Telephone Encounter (Signed)
LMOVM reminding pt to send remote transmission.   

## 2015-05-06 ENCOUNTER — Ambulatory Visit (HOSPITAL_COMMUNITY)
Admission: RE | Admit: 2015-05-06 | Discharge: 2015-05-06 | Disposition: A | Discharge status: Home / Self Care | Source: Ambulatory Visit | Attending: Cardiovascular Disease | Admitting: Cardiovascular Disease

## 2015-05-06 ENCOUNTER — Encounter (HOSPITAL_COMMUNITY)
Admission: RE | Disposition: A | Discharge status: Home / Self Care | Source: Ambulatory Visit | Attending: Cardiovascular Disease

## 2015-05-06 DIAGNOSIS — I4891 Unspecified atrial fibrillation: Secondary | ICD-10-CM | POA: Diagnosis not present

## 2015-05-06 DIAGNOSIS — I5042 Chronic combined systolic (congestive) and diastolic (congestive) heart failure: Secondary | ICD-10-CM | POA: Insufficient documentation

## 2015-05-06 DIAGNOSIS — G4733 Obstructive sleep apnea (adult) (pediatric): Secondary | ICD-10-CM | POA: Diagnosis not present

## 2015-05-06 DIAGNOSIS — E785 Hyperlipidemia, unspecified: Secondary | ICD-10-CM | POA: Diagnosis not present

## 2015-05-06 DIAGNOSIS — I442 Atrioventricular block, complete: Secondary | ICD-10-CM | POA: Insufficient documentation

## 2015-05-06 DIAGNOSIS — F419 Anxiety disorder, unspecified: Secondary | ICD-10-CM | POA: Insufficient documentation

## 2015-05-06 DIAGNOSIS — E890 Postprocedural hypothyroidism: Secondary | ICD-10-CM | POA: Diagnosis not present

## 2015-05-06 DIAGNOSIS — F17210 Nicotine dependence, cigarettes, uncomplicated: Secondary | ICD-10-CM | POA: Insufficient documentation

## 2015-05-06 DIAGNOSIS — Z6839 Body mass index (BMI) 39.0-39.9, adult: Secondary | ICD-10-CM | POA: Diagnosis not present

## 2015-05-06 DIAGNOSIS — Z4502 Encounter for adjustment and management of automatic implantable cardiac defibrillator: Secondary | ICD-10-CM | POA: Diagnosis present

## 2015-05-06 DIAGNOSIS — T82111A Breakdown (mechanical) of cardiac pulse generator (battery), initial encounter: Secondary | ICD-10-CM | POA: Insufficient documentation

## 2015-05-06 DIAGNOSIS — F329 Major depressive disorder, single episode, unspecified: Secondary | ICD-10-CM | POA: Diagnosis not present

## 2015-05-06 DIAGNOSIS — I420 Dilated cardiomyopathy: Secondary | ICD-10-CM | POA: Diagnosis not present

## 2015-05-06 DIAGNOSIS — Z95810 Presence of automatic (implantable) cardiac defibrillator: Secondary | ICD-10-CM | POA: Diagnosis present

## 2015-05-06 HISTORY — PX: EP IMPLANTABLE DEVICE: SHX172B

## 2015-05-06 LAB — SURGICAL PCR SCREEN
MRSA, PCR: NEGATIVE
Staphylococcus aureus: NEGATIVE

## 2015-05-06 SURGERY — ICD/BIV ICD GENERATOR CHANGEOUT
Anesthesia: LOCAL

## 2015-05-06 MED ORDER — LIDOCAINE HCL (PF) 1 % IJ SOLN
INTRAMUSCULAR | Status: AC
  Filled 2015-05-06: qty 30

## 2015-05-06 MED ORDER — MIDAZOLAM HCL 5 MG/5ML IJ SOLN
INTRAMUSCULAR | Status: AC
  Filled 2015-05-06: qty 5

## 2015-05-06 MED ORDER — HEPARIN (PORCINE) IN NACL 2-0.9 UNIT/ML-% IJ SOLN
INTRAMUSCULAR | Status: AC
  Filled 2015-05-06: qty 1000

## 2015-05-06 MED ORDER — ONDANSETRON HCL 4 MG/2ML IJ SOLN: 4.0000 mg | Freq: Four times a day (QID) | INTRAMUSCULAR | Status: DC | PRN

## 2015-05-06 MED ORDER — SODIUM CHLORIDE 0.9 % IV SOLN: INTRAVENOUS | Status: DC

## 2015-05-06 MED ORDER — MIDAZOLAM HCL 5 MG/5ML IJ SOLN
INTRAMUSCULAR | Status: DC | PRN
  Administered 2015-05-06: 2 mg via INTRAVENOUS
  Administered 2015-05-06: 1 mg via INTRAVENOUS

## 2015-05-06 MED ORDER — ACETAMINOPHEN 325 MG PO TABS
325.0000 mg | ORAL_TABLET | ORAL | Status: DC | PRN
  Filled 2015-05-06: qty 2

## 2015-05-06 MED ORDER — MUPIROCIN 2 % EX OINT: TOPICAL_OINTMENT | Freq: Two times a day (BID) | CUTANEOUS | Status: DC

## 2015-05-06 MED ORDER — FENTANYL CITRATE (PF) 100 MCG/2ML IJ SOLN
INTRAMUSCULAR | Status: AC
  Filled 2015-05-06: qty 2

## 2015-05-06 MED ORDER — FENTANYL CITRATE (PF) 100 MCG/2ML IJ SOLN
INTRAMUSCULAR | Status: DC | PRN
  Administered 2015-05-06: 25 ug via INTRAVENOUS
  Administered 2015-05-06: 50 ug via INTRAVENOUS

## 2015-05-06 MED ORDER — MUPIROCIN 2 % EX OINT
TOPICAL_OINTMENT | CUTANEOUS | Status: AC
  Filled 2015-05-06: qty 22

## 2015-05-06 MED ORDER — SODIUM CHLORIDE 0.9 % IJ SOLN: 3.0000 mL | INTRAMUSCULAR | Status: DC | PRN

## 2015-05-06 SURGICAL SUPPLY — 4 items
CABLE SURGICAL S-101-97-12 (CABLE) ×2 IMPLANT
ELECT DEFIB PAD ADLT CADENCE (PAD) ×2 IMPLANT
ICD VIVA XT CRT-D DTBA1D1 (ICD Generator) ×2 IMPLANT
TRAY PACEMAKER INSERTION (CUSTOM PROCEDURE TRAY) ×2 IMPLANT

## 2015-05-06 NOTE — H&P (View-Only) (Signed)
Patient ID: Brian Craig, male   DOB: 1951/01/17, 64 y.o.   MRN: [id number]      Cardiology Office Note   Date:  04/17/2015   ID:  Brian Craig, DOB [birth year], MRN [id number]  PCP:  Brian Pilgrim, FNP  Cardiologist:   Brian Klein, MD   Chief Complaint  Patient presents with  . CRT-D device at RRT    patient reports shortness of breath on exertion, occasional ankle edema, and lightheadedness when he stands up.      History of Present Illness: Brian Craig is a 64 y.o. male who presents after his generator alarmed and remote download showed that the device has reached ERI.   He feels well, has mild exertional dyspnea and edema if he does not take furosemide. No embolic or bleeding complications  He would like to schedule device changeout later in May and we agreed on May 24. As before, he prefers the "added security" of an ICD to a CRT-P downgrade.  Brian Craig has a history of severe dilated cardiomyopathy related to uncontrolled atrial fibrillation with rapid ventricular response that started in the setting of thyrotoxicosis. His left ventricular ejection fraction was as low as 17%. Cardioversion failed. Eventually underwent AV node ablation and received a biventricular pacemaker-defibrillator in 2007. He underwent a generator change out in 2012 (atrial lead Medtronic 5076, ICD lead Medtronic [phone] Sprint Quattro secure, LV lead Medtronic 4194 Attain bipolar, Medtronic protecta [id number] device). His left ventricular ejection fraction has improved substantially since AV node ablation. His most recent echocardiogram performed in August 2015 shows normal left ventricular systolic function. He remains in permanent atrial fibrillation.  He has obstructive sleep apnea compliant with treatment with CPAP. He takes medication for depression, hyperlipidemia and erectile dysfunction as well as a thyroid supplement following his thyroid ablation. He does not have a history of stroke/TIA or other  embolic events and does not have a history of bleeding complications.  Brian Craig has recently relocated to [place] from [place]. He previously received cardiology care at [facility] cardiology where he was seen by Dr. Randol Craig (EP) and Dr. Lanelle Craig. 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 He is a former alcoholic but still goes to [employer] and has been completely sober since 1984. He smokes roughly half a pack of cigarettes a day and has no intention of quitting  Past Medical History  Diagnosis Date  . CHF (congestive heart failure)   . OSA treated with BiPAP   . Graves disease     Past Surgical History  Procedure Laterality Date  . Biv icd genertaor change out Left 2012    Medtronic Protecta     Current Outpatient Prescriptions  Medication Sig Dispense Refill  . ALPRAZolam (XANAX) 0.5 MG tablet Take 0.5 mg by mouth 3 (three) times daily as needed for anxiety.    . carvedilol (COREG CR) 80 MG 24 hr capsule Take 80 mg by mouth daily.    . dabigatran (PRADAXA) 150 MG CAPS capsule Take 150 mg by mouth 2 (two) times daily.    . digoxin (LANOXIN) 0.25 MG tablet Take 0.25 mg by mouth daily.    . furosemide (LASIX) 40 MG tablet Take 40 mg by mouth daily.    Marland Kitchen levothyroxine (SYNTHROID, LEVOTHROID) 200 MCG tablet Take 200 mcg by mouth daily before breakfast.    . lisinopril (PRINIVIL,ZESTRIL) 10 MG tablet Take 1 tablet (10  mg total) by mouth 2 (two) times daily. 180 tablet 1  . Multiple Vitamin (MULTIVITAMIN) tablet Take 1 tablet by mouth daily.    . potassium chloride (K-DUR,KLOR-CON) 10 MEQ tablet Take 10 mEq by mouth daily.    . sertraline (ZOLOFT) 50 MG tablet Take 50 mg by mouth daily.    . simvastatin (ZOCOR) 40 MG tablet Take 40 mg by mouth daily.    . vardenafil (LEVITRA) 10 MG tablet Take 10 mg by mouth daily as needed for erectile dysfunction.     No current  facility-administered medications for this visit.    Allergies:   Review of patient's allergies indicates no known allergies.    Social History:  The patient  reports that he has been smoking Cigarettes.  He has been smoking about 0.50 packs per day. He does not have any smokeless tobacco history on file. He reports that he does not drink alcohol or use illicit drugs.   Family History:  The patient's family history includes Heart disease in his father, mother, paternal aunt, and paternal uncle.    ROS:  Please see the history of present illness.    Otherwise, review of systems positive for none.   All other systems are reviewed and negative.    PHYSICAL EXAM: VS:  BP 100/60 mmHg  Pulse 72  Ht 6\' 1"  (1.854 m)  Wt 299 lb 4.8 oz (135.762 kg)  BMI 39.50 kg/m2 , BMI Body mass index is 39.5 kg/(m^2).  General: Alert, oriented x3, no distress Head: no evidence of trauma, PERRL, EOMI, no exophtalmos or lid lag, no myxedema, no xanthelasma; normal ears, nose and oropharynx Neck: normal jugular venous pulsations and no hepatojugular reflux; brisk carotid pulses without delay and no carotid bruits Chest: clear to auscultation, no signs of consolidation by percussion or palpation, normal fremitus, symmetrical and full respiratory excursions Cardiovascular: normal position and quality of the apical impulse, regular rhythm, normal first and second heart sounds, no murmurs, rubs or gallops Abdomen: no tenderness or distention, no masses by palpation, no abnormal pulsatility or arterial bruits, normal bowel sounds, no hepatosplenomegaly Extremities: no clubbing, cyanosis or edema; 2+ radial, ulnar and brachial pulses bilaterally; 2+ right femoral, posterior tibial and dorsalis pedis pulses; 2+ left femoral, posterior tibial and dorsalis pedis pulses; no subclavian or femoral bruits Neurological: grossly nonfocal Psych: euthymic mood, full affect   EKG:  EKG is ordered today. The ekg ordered today  demonstrates atrial fibrillation, V paced   Recent Labs: 07/22/2014: ALT 15; BUN 8; Creatinine 0.93; Potassium 4.0; Sodium 139    Lipid Panel    Component Value Date/Time   CHOL 110 07/22/2014 0939   TRIG 134 07/22/2014 0939   HDL 30* 07/22/2014 0939   CHOLHDL 3.7 07/22/2014 0939   VLDL 27 07/22/2014 0939   LDLCALC 53 07/22/2014 0939      Wt Readings from Last 3 Encounters:  04/16/15 299 lb 4.8 oz (135.762 kg)  03/10/15 319 lb 6.4 oz (144.879 kg)  07/15/14 303 lb 3.2 oz (137.531 kg)      Other studies Reviewed:   ASSESSMENT AND PLAN:  Previous device check showed good lead parameters. He is pacemaker dependent after AV node ablation. Discussed generator change at length. This procedure has been fully reviewed with the patient and informed consent has been obtained.  Chronic combined systolic and diastolic CHF, NYHA class 1 Appears to be very well compensated. No clinical evidence of hypervolemia, NYHA functional class IB. He requires a low dose of  loop diuretic for compensation and has not had acute heart failure exacerbation requiring dose adjustment her hospitalization. Still need to convince him that it is not really necessary to take digoxin and that it may actually be deleterious.  CHB (complete heart block) He is pacemaker dependent status post AV node ablation.  Congestive dilated cardiomyopathy Complete recovery of left ventricular systolic function confirms that he primarily had tachycardia related cardiomyopathy.  Biventricular ICD implanted 2007, generator change in 2012 Normal device function, at last check. No history of delivered appropriate or inappropriate defibrillator therapy. Excellent percentage of biventricular pacing. Offered option for "downgraded" to CRT-P, but he prefers the security of a defibrillator in case cardiomyopathy recurs.  OSA on CPAP He reports compliance with therapy.  Tobacco abuse He stated that he does not have a plan to  quit smoking. We'll have to slowly try to convince him of the importance of smoking cessation over his next visits.  Dyslipidemia (high LDL; low HDL) Excellent LDL on current statin therapy. We will not see improvement in HDL without substantial weight loss.  Morbid obesity He is a tall and very large framed gentleman with strong bones and muscles but is also clearly obese. Weight loss would be beneficial.  Hypothyroidism, postradioiodine therapy On hormone replacement therapy  Alcoholism in recovery He is congratulated for more than 30 years of abstinence. Current medicines are reviewed at length with the patient today.  The patient does not have concerns regarding medicines.  The following changes have been made:  no change  Labs/ tests ordered today include:  Orders Placed This Encounter  Procedures  . APTT  . Protime-INR  . CBC  . Comprehensive metabolic panel  . Lipid panel  . EKG 12-Lead  . IMPLANTABLE CARDIOVERTER DEFIBRILLATOR (ICD) GENERATOR CHANGE   Patient Instructions  Your physician has recommended that you have your defibrillator battery changed out.   Your physician recommends that you return for lab work in: 05/01/15 at [facility] lab.       Mikael Spray, MD  04/17/2015 10:10 PM    Brian Klein, MD, [facility] [phone] office [phone] pager

## 2015-05-06 NOTE — Interval H&P Note (Signed)
History and Physical Interval Note:  05/06/2015 1:31 PM  Brian Craig  has presented today for surgery, with the diagnosis of ERI  The various methods of treatment have been discussed with the patient and family. After consideration of risks, benefits and other options for treatment, the patient has consented to  Procedure(s): ICD [employer] (N/A) as a surgical intervention .  The patient's history has been reviewed, patient examined, no change in status, stable for surgery.  I have reviewed the patient's chart and labs.  Questions were answered to the patient's satisfaction.     [name]

## 2015-05-06 NOTE — Op Note (Signed)
  Error. See results review

## 2015-05-06 NOTE — Discharge Instructions (Signed)
Pacemaker Battery Change, Care After °Refer to this sheet in the next few weeks. These instructions provide you with information on caring for yourself after your procedure. Your health care provider may also give you more specific instructions. Your treatment has been planned according to current medical practices, but problems sometimes occur. Call your health care provider if you have any problems or questions after your procedure. °WHAT TO EXPECT AFTER THE PROCEDURE °After your procedure, it is typical to have the following sensations: °· Soreness at the pacemaker site. °HOME CARE INSTRUCTIONS  °· Keep the incision clean and dry. °· Unless advised otherwise, you may shower beginning 48 hours after your procedure. °· For the first week after the replacement, avoid stretching motions that pull at the incision site, and avoid heavy exercise with the arm that is on the same side as the incision. °· Take medicines only as directed by your health care provider. °· Keep all follow-up visits as directed by your health care provider. °SEEK MEDICAL CARE IF:  °· You have pain at the incision site that is not relieved by over-the-counter or prescription medicine. °· There is drainage or pus from the incision site. °· There is swelling larger than a lime at the incision site. °· You develop red streaking that extends above or below the incision site. °· You feel brief, intermittent palpitations, light-headedness, or any symptoms that you feel might be related to your heart. °SEEK IMMEDIATE MEDICAL CARE IF:  °· You experience chest pain that is different than the pain at the pacemaker site. °· You experience shortness of breath. °· You have palpitations or irregular heartbeat. °· You have light-headedness that does not go away quickly. °· You faint. °· You have pain that gets worse and is not relieved by medicine. °Document Released: 09/19/2013 Document Revised: 04/15/2014 Document Reviewed: 09/19/2013 °ExitCare® Patient  Information ©2015 [employer]. This information is not intended to replace advice given to you by your health care provider. Make sure you discuss any questions you have with your health care provider. ° °

## 2015-05-07 ENCOUNTER — Encounter (HOSPITAL_COMMUNITY): Payer: Self-pay | Admitting: Cardiovascular Disease

## 2015-05-07 ENCOUNTER — Telehealth: Payer: Self-pay | Admitting: Cardiovascular Disease

## 2015-05-07 MED ORDER — TRAMADOL HCL 50 MG PO TABS: 50.0000 mg | ORAL_TABLET | Freq: Four times a day (QID) | ORAL | Status: DC | PRN

## 2015-05-07 MED FILL — Heparin Sodium (Porcine) 2 Unit/ML in Sodium Chloride 0.9%: INTRAMUSCULAR | Qty: 1000 | Status: AC

## 2015-05-07 MED FILL — Lidocaine HCl Local Preservative Free (PF) Inj 1%: INTRAMUSCULAR | Qty: 60 | Status: AC

## 2015-05-07 NOTE — Telephone Encounter (Signed)
PATIENT STATES HE HAD CHANGEOUT GENERATOR  THE SITE IS A HURTING A LITTLE. NO DRAINAGE NOTED PER PATIENT OR SWELLING  HE HAS BEEN USING TYLENOL 500 MG 2 TABLETS ABOUT EVERY 6 HOURS   WOULD LIKE SOMETHING FOR THE DISCOMFORT.

## 2015-05-07 NOTE — Telephone Encounter (Signed)
Pt called in stating that he had a Catheterazation done on 5/24 and now he is in a little bit of pain. He would like to know if Dr. Loletha Grayer could prescribed him some pain medication. Please call  Thanks

## 2015-05-07 NOTE — Progress Notes (Signed)
ICD Criteria  Current LVEF:49% ;Obtained > 6 months ago.   NYHA Functional Classification: Class II  Heart Failure History:  Yes, Duration of heart failure since onset is > 9 months  Non-Ischemic Dilated Cardiomyopathy History:  Yes, timeframe is > 9 months  Atrial Fibrillation/Atrial Flutter:  Yes, A-Fib/A-Flutter type: Permanent (>1 year).  Ventricular Tachycardia History:  No.  Cardiac Arrest History:  No  History of Syndromes with Risk of Sudden Death:  No.  Previous ICD:  Yes, ICD Type:  CRT-D, Reason for ICD:  Primary prevention.  17%  Electrophysiology Study: No.  Prior MI: No.  PPM: No.  OSA:  Yes  Patient Life Expectancy of >=1 year: Yes.  Anticoagulation Therapy:  Patient is on anticoagulation therapy, anticoagulation was held prior to procedure.   Beta Blocker Therapy:  Yes.   Ace Inhibitor/ARB Therapy:  Yes.

## 2015-05-07 NOTE — Telephone Encounter (Signed)
CALLED PRESCRIPTION INTO [facility] SPOKE TO PHARMACIST - JACK PATIENT AWARE TO PICK UP MEDICATIONS PATIENT AWARE TO CUT BACK ON NSAIDS

## 2015-05-07 NOTE — Telephone Encounter (Signed)
Unfortunately, cannot call in narcotics. Needs paper script. Would advise against NSAIDs due to anticoagulation therapy. If we can call in ultram 50 mg Q6h, #20, no RF, that would be OK (not sure if that needs a paper script)

## 2015-05-08 ENCOUNTER — Telehealth: Payer: Self-pay | Admitting: Cardiovascular Disease

## 2015-05-08 NOTE — Telephone Encounter (Signed)
Closed encounter °

## 2015-05-15 ENCOUNTER — Ambulatory Visit (INDEPENDENT_AMBULATORY_CARE_PROVIDER_SITE_OTHER): Admitting: *Deleted

## 2015-05-15 DIAGNOSIS — I482 Chronic atrial fibrillation: Secondary | ICD-10-CM | POA: Diagnosis not present

## 2015-05-15 DIAGNOSIS — I442 Atrioventricular block, complete: Secondary | ICD-10-CM

## 2015-05-15 DIAGNOSIS — I5042 Chronic combined systolic (congestive) and diastolic (congestive) heart failure: Secondary | ICD-10-CM

## 2015-05-15 DIAGNOSIS — I4821 Permanent atrial fibrillation: Secondary | ICD-10-CM

## 2015-05-16 ENCOUNTER — Encounter: Payer: Self-pay | Admitting: Cardiovascular Disease

## 2015-05-16 LAB — CUP PACEART INCLINIC DEVICE CHECK
Battery Remaining Longevity: 91 mo
Brady Statistic AP VP Percent: 0 %
Brady Statistic AP VS Percent: 0 %
Brady Statistic AS VS Percent: 0.7 %
Brady Statistic RV Percent Paced: 98.62 %
HighPow Impedance: 114 Ohm
HighPow Impedance: 41 Ohm
HighPow Impedance: 53 Ohm
Lead Channel Impedance Value: 361 Ohm
Lead Channel Impedance Value: 399 Ohm
Lead Channel Impedance Value: 456 Ohm
Lead Channel Impedance Value: 836 Ohm
Lead Channel Pacing Threshold Amplitude: 0.625 V
Lead Channel Pacing Threshold Pulse Width: 0.4 ms
Lead Channel Pacing Threshold Pulse Width: 0.8 ms
Lead Channel Sensing Intrinsic Amplitude: 4.25 mV
Lead Channel Setting Pacing Amplitude: 2.5 V
Lead Channel Setting Pacing Pulse Width: 0.8 ms
Lead Channel Setting Sensing Sensitivity: 0.3 mV
MDC IDC MSMT BATTERY VOLTAGE: 3.11 V
MDC IDC MSMT LEADCHNL LV IMPEDANCE VALUE: 608 Ohm
MDC IDC MSMT LEADCHNL LV PACING THRESHOLD AMPLITUDE: 1.375 V
MDC IDC MSMT LEADCHNL RA SENSING INTR AMPL: 4.25 mV
MDC IDC SESS DTM: 20160602142954
MDC IDC SET LEADCHNL LV PACING AMPLITUDE: 2.25 V
MDC IDC SET LEADCHNL RV PACING PULSEWIDTH: 0.4 ms
MDC IDC SET ZONE DETECTION INTERVAL: 300 ms
MDC IDC SET ZONE DETECTION INTERVAL: 360 ms
MDC IDC STAT BRADY AS VP PERCENT: 99.3 %
MDC IDC STAT BRADY RA PERCENT PACED: 0 %
Zone Setting Detection Interval: 350 ms
Zone Setting Detection Interval: 400 ms

## 2015-05-16 NOTE — Progress Notes (Signed)
Wound check appointment. Staples removed. Wound without redness or edema. Incision edges approximated, wound well healed. Normal device function. Thresholds and impedances consistent with implant measurements. Device programmed at appropriate safety margins. Histogram distribution appropriate for patient and level of activity. Permanent AF + Pradaxa. No ventricular arrhythmias noted. Patient educated about wound care, arm mobility, lifting restrictions, shock plan. ROV in 3 months with [facility].

## 2015-05-27 ENCOUNTER — Encounter: Payer: Self-pay | Admitting: Cardiovascular Disease

## 2015-05-30 ENCOUNTER — Encounter: Admitting: Cardiovascular Disease

## 2015-07-24 ENCOUNTER — Telehealth: Payer: Self-pay | Admitting: Cardiovascular Disease

## 2015-07-24 MED ORDER — SIMVASTATIN 40 MG PO TABS: 40.0000 mg | ORAL_TABLET | Freq: Every day | ORAL | Status: DC

## 2015-07-24 MED ORDER — FUROSEMIDE 40 MG PO TABS: 40.0000 mg | ORAL_TABLET | Freq: Every day | ORAL | Status: DC

## 2015-07-24 MED ORDER — DABIGATRAN ETEXILATE MESYLATE 150 MG PO CAPS: 150.0000 mg | ORAL_CAPSULE | Freq: Two times a day (BID) | ORAL | Status: DC

## 2015-07-24 MED ORDER — CARVEDILOL PHOSPHATE ER 80 MG PO CP24: 80.0000 mg | ORAL_CAPSULE | Freq: Every day | ORAL | Status: DC

## 2015-07-24 MED ORDER — DIGOXIN 250 MCG PO TABS: 0.2500 mg | ORAL_TABLET | Freq: Every day | ORAL | Status: DC

## 2015-07-24 MED ORDER — LISINOPRIL 10 MG PO TABS: 10.0000 mg | ORAL_TABLET | Freq: Two times a day (BID) | ORAL | Status: DC

## 2015-07-24 MED ORDER — POTASSIUM CHLORIDE CRYS ER 10 MEQ PO TBCR: 10.0000 meq | EXTENDED_RELEASE_TABLET | Freq: Every day | ORAL | Status: DC

## 2015-07-24 NOTE — Telephone Encounter (Signed)
°  1. Which medications need to be refilled? Pradaxa, Digoxin, Coreg, Furosemide, Simvastatin, Lisinopril , Klor Con  2. Which pharmacy is medication to be sent to?[facility]   3. Do they need a 30 day or 90 day supply? 90  4. Would they like a call back once the medication has been sent to the pharmacy? Yes

## 2015-07-24 NOTE — Telephone Encounter (Signed)
Pt aware refill sent to the pharmacy electronically   

## 2015-08-12 ENCOUNTER — Ambulatory Visit (INDEPENDENT_AMBULATORY_CARE_PROVIDER_SITE_OTHER): Admitting: Cardiovascular Disease

## 2015-08-12 ENCOUNTER — Encounter: Payer: Self-pay | Admitting: Cardiovascular Disease

## 2015-08-12 VITALS — BP 107/75 | HR 84 | Resp 16 | Ht 73.0 in | Wt 309.0 lb

## 2015-08-12 DIAGNOSIS — I482 Chronic atrial fibrillation: Secondary | ICD-10-CM | POA: Diagnosis not present

## 2015-08-12 DIAGNOSIS — I4821 Permanent atrial fibrillation: Secondary | ICD-10-CM

## 2015-08-12 DIAGNOSIS — I5042 Chronic combined systolic (congestive) and diastolic (congestive) heart failure: Secondary | ICD-10-CM

## 2015-08-12 DIAGNOSIS — I442 Atrioventricular block, complete: Secondary | ICD-10-CM

## 2015-08-12 DIAGNOSIS — I420 Dilated cardiomyopathy: Secondary | ICD-10-CM | POA: Diagnosis not present

## 2015-08-12 DIAGNOSIS — Z95810 Presence of automatic (implantable) cardiac defibrillator: Secondary | ICD-10-CM

## 2015-08-12 LAB — CUP PACEART INCLINIC DEVICE CHECK
Battery Remaining Longevity: 84 mo
Brady Statistic AP VP Percent: 0 %
Brady Statistic AP VS Percent: 0 %
Brady Statistic AS VP Percent: 99.2 %
Brady Statistic RA Percent Paced: 0 %
Brady Statistic RV Percent Paced: 98.48 %
HighPow Impedance: 42 Ohm
HighPow Impedance: 55 Ohm
Lead Channel Impedance Value: 361 Ohm
Lead Channel Impedance Value: 399 Ohm
Lead Channel Impedance Value: 418 Ohm
Lead Channel Impedance Value: 608 Ohm
Lead Channel Pacing Threshold Pulse Width: 0.8 ms
Lead Channel Sensing Intrinsic Amplitude: 4.25 mV
Lead Channel Setting Pacing Amplitude: 2.5 V
Lead Channel Setting Pacing Amplitude: 2.5 V
MDC IDC MSMT BATTERY VOLTAGE: 3.06 V
MDC IDC MSMT LEADCHNL LV IMPEDANCE VALUE: 874 Ohm
MDC IDC MSMT LEADCHNL LV PACING THRESHOLD AMPLITUDE: 1.5 V
MDC IDC MSMT LEADCHNL RA SENSING INTR AMPL: 4.25 mV
MDC IDC MSMT LEADCHNL RV IMPEDANCE VALUE: 361 Ohm
MDC IDC MSMT LEADCHNL RV PACING THRESHOLD AMPLITUDE: 0.75 V
MDC IDC MSMT LEADCHNL RV PACING THRESHOLD PULSEWIDTH: 0.4 ms
MDC IDC SESS DTM: 20160830132019
MDC IDC SET LEADCHNL LV PACING PULSEWIDTH: 0.8 ms
MDC IDC SET LEADCHNL RV PACING PULSEWIDTH: 0.4 ms
MDC IDC SET LEADCHNL RV SENSING SENSITIVITY: 0.3 mV
MDC IDC SET ZONE DETECTION INTERVAL: 360 ms
MDC IDC SET ZONE DETECTION INTERVAL: 400 ms
MDC IDC STAT BRADY AS VS PERCENT: 0.8 %
Zone Setting Detection Interval: 300 ms
Zone Setting Detection Interval: 350 ms

## 2015-08-12 MED ORDER — DIGOXIN 125 MCG PO TABS: 0.1250 mg | ORAL_TABLET | Freq: Every day | ORAL | Status: DC

## 2015-08-12 NOTE — Progress Notes (Signed)
Patient ID: Brian Craig, male   DOB: [birth year], 64 y.o.   MRN: [id number]     Cardiology Office Note   Date:  08/12/2015   ID:  Brian Craig, DOB December 14, 1950, MRN [id number]  PCP:  Gerilyn Pilgrim, FNP  Cardiologist:   Sanda Klein, MD   Chief Complaint  Patient presents with  . PHYS DEFIB CHECK    Patient has swelling at times.      History of Present Illness: Brian Craig is a 64 y.o. male who presents for permanent atrial fibrillation, nonischemic cardiomyopathy, complete heart block status post AV node ablation, biventricular defibrillator check.  He's had a great summer, without any new health problems. His current CRT-D device (Medtronic Viva XT) was placed as a generator change in May 2016. Estimated generator longevity is 7 years.  Device interrogation shows 98.5% biventricular pacing with another 1.5% triggered pacing. His thoracic impedance is within normal range. There has been no evidence of ventricular tachycardia.  Brian Craig has a history of severe dilated cardiomyopathy related to uncontrolled atrial fibrillation with rapid ventricular response that started in the setting of thyrotoxicosis. His left ventricular ejection fraction was as low as 17%. Cardioversion failed. Eventually underwent AV node ablation and received a biventricular pacemaker-defibrillator in 2007. He underwent a generator change out in 2012 (atrial lead Medtronic 5076, ICD lead Medtronic [phone] Sprint Quattro secure, LV lead Medtronic 4194 Attain bipolar, Medtronic protecta D334TRG device). His left ventricular ejection fraction has improved substantially since AV node ablation. His most recent echocardiogram performed in August 2015 shows normal left ventricular systolic function. He remains in permanent atrial fibrillation.  Past Medical History  Diagnosis Date  . CHF (congestive heart failure)   . OSA treated with BiPAP   . Graves disease     Past Surgical History  Procedure Laterality Date  .  Biv icd genertaor change out Left 2012    Medtronic Protecta  . Ep implantable device N/A 05/06/2015    Procedure: ICD Generator Changeout;  Surgeon: Sanda Klein, MD;  Location: [facility];  Service: Cardiovascular;  Laterality: N/A;     Current Outpatient Prescriptions  Medication Sig Dispense Refill  . acetaminophen (TYLENOL) 325 MG tablet Take 650 mg by mouth every 6 (six) hours as needed for headache.    . ALPRAZolam (XANAX) 0.5 MG tablet Take 0.5 mg by mouth 3 (three) times daily as needed for anxiety.    . carvedilol (COREG CR) 80 MG 24 hr capsule Take 1 capsule (80 mg total) by mouth daily. 90 capsule 3  . dabigatran (PRADAXA) 150 MG CAPS capsule Take 1 capsule (150 mg total) by mouth 2 (two) times daily. 180 capsule 3  . furosemide (LASIX) 40 MG tablet Take 1 tablet (40 mg total) by mouth daily. 90 tablet 3  . levothyroxine (SYNTHROID, LEVOTHROID) 200 MCG tablet Take 200 mcg by mouth daily before breakfast.    . lisinopril (PRINIVIL,ZESTRIL) 10 MG tablet Take 1 tablet (10 mg total) by mouth 2 (two) times daily. 180 tablet 1  . Multiple Vitamin (MULTIVITAMIN) tablet Take 1 tablet by mouth daily.    . potassium chloride (K-DUR,KLOR-CON) 10 MEQ tablet Take 1 tablet (10 mEq total) by mouth daily. 90 tablet 3  . sertraline (ZOLOFT) 50 MG tablet Take 50 mg by mouth daily.    . simvastatin (ZOCOR) 40 MG tablet Take 1 tablet (40 mg total) by mouth daily. 90 tablet 3  . traMADol (ULTRAM) 50 MG tablet Take 1 tablet (50 mg total) by  mouth every 6 (six) hours as needed for moderate pain. 20 tablet 0  . vardenafil (LEVITRA) 10 MG tablet Take 10 mg by mouth daily as needed for erectile dysfunction.    . digoxin (LANOXIN) 0.125 MG tablet Take 1 tablet (0.125 mg total) by mouth daily. 90 tablet 3   No current facility-administered medications for this visit.    Allergies:   Review of patient's allergies indicates no known allergies.    Social History:  The patient  reports that he has  been smoking Cigarettes.  He has been smoking about 0.50 packs per day. He does not have any smokeless tobacco history on file. He reports that he does not drink alcohol or use illicit drugs.   Family History:  The patient's family history includes Heart disease in his father, mother, paternal aunt, and paternal uncle.    ROS:  Please see the history of present illness.    Otherwise, review of systems positive for none.   All other systems are reviewed and negative.    PHYSICAL EXAM: VS:  BP 107/75 mmHg  Pulse 84  Resp 16  Ht 6\' 1"  (1.854 m)  Wt 309 lb (140.161 kg)  BMI 40.78 kg/m2 , BMI Body mass index is 40.78 kg/(m^2).  General: Alert, oriented x3, no distress, morbid obesity limits exam Head: no evidence of trauma, PERRL, EOMI, no exophtalmos or lid lag, no myxedema, no xanthelasma; normal ears, nose and oropharynx Neck: normal jugular venous pulsations and no hepatojugular reflux; brisk carotid pulses without delay and no carotid bruits Chest: clear to auscultation, no signs of consolidation by percussion or palpation, normal fremitus, symmetrical and full respiratory excursions Cardiovascular: Unable to identify the apical impulse, regular rhythm, normal first and paradoxically split second heart sounds, no murmurs, rubs or gallops Abdomen: no tenderness or distention, no masses by palpation, no abnormal pulsatility or arterial bruits, normal bowel sounds, no hepatosplenomegaly Extremities: no clubbing, cyanosis or edema; 2+ radial, ulnar and brachial pulses bilaterally; 2+ right femoral, posterior tibial and dorsalis pedis pulses; 2+ left femoral, posterior tibial and dorsalis pedis pulses; no subclavian or femoral bruits Neurological: grossly nonfocal Psych: euthymic mood, full affect   EKG:  EKG is not ordered today.  Recent Labs: 04/30/2015: ALT 15; BUN 8; Creat 0.69; Hemoglobin 15.1; Platelets 230; Potassium 4.3; Sodium 139    Lipid Panel    Component Value Date/Time    CHOL 98 04/30/2015 1444   TRIG 148 04/30/2015 1444   HDL 29* 04/30/2015 1444   CHOLHDL 3.4 04/30/2015 1444   VLDL 30 04/30/2015 1444   LDLCALC 39 04/30/2015 1444      Wt Readings from Last 3 Encounters:  08/12/15 309 lb (140.161 kg)  05/06/15 300 lb (136.079 kg)  04/16/15 299 lb 4.8 oz (135.762 kg)       ASSESSMENT AND PLAN:  Chronic combined systolic and diastolic CHF, NYHA class 1 Appears to be very well compensated. No clinical evidence of hypervolemia, NYHA functional class IB. He requires a low dose of loop diuretic for compensation and has not had acute heart failure exacerbation requiring dose adjustment her hospitalization. Recommend hereduce the dose of digoxin in half and plan to discontinue this medication altogether. He seems more open to this suggestion today  CHB (complete heart block) He is pacemaker dependent status post AV node ablation.  Congestive dilated cardiomyopathy Complete recovery of left ventricular systolic function confirms that he primarily had tachycardia related cardiomyopathy.  Biventricular ICD  implanted 2007, generator change in 2012  Normal device function, at last check. No history of delivered appropriate or inappropriate defibrillator therapy. Excellent percentage of biventricular pacing.   OSA on CPAP He reports compliance with therapy.  Tobacco abuse He stated that he does not have a plan to quit smoking. We'll have to slowly try to convince him of the importance of smoking cessation over his next visits.  Dyslipidemia (high LDL; low HDL) Excellent LDL on current statin therapy. We will not see improvement in HDL without substantial weight loss.  Morbid obesity He is a tall and very large framed gentleman with strong bones and muscles but is also clearly obese. Weight loss would be beneficial.  Hypothyroidism, postradioiodine therapy On hormone replacement therapy  Alcoholism in recovery He is congratulated for more than 30  years of abstinence.  Current medicines are reviewed at length with the patient today.  The patient does not have concerns regarding medicines.   Labs/ tests ordered today include:  No orders of the defined types were placed in this encounter.    Patient Instructions  Remote monitoring is used to monitor your ICD from home. This monitoring reduces the number of office visits required to check your device to one time per year. It allows [place] to keep an eye on the functioning of your device to ensure it is working properly. You are scheduled for a device check from home on 11/11/2015. You may send your transmission at any time that day. If you have a wireless device, the transmission will be sent automatically. After your physician reviews your transmission, you will receive a postcard with your next transmission date.  Your physician recommends that you schedule a follow-up appointment in: 6 months with Dr.Katricia Prehn  Your physician has recommended you make the following change in your medication: Decrease Digoxin to 0.125mg  daily     Signed, Sanda Klein, MD  08/12/2015 1:40 PM    Sanda Klein, MD, [facility] [phone] office [phone] pager

## 2015-08-12 NOTE — Patient Instructions (Addendum)
Remote monitoring is used to monitor your ICD from home. This monitoring reduces the number of office visits required to check your device to one time per year. It allows [place] to keep an eye on the functioning of your device to ensure it is working properly. You are scheduled for a device check from home on 11/11/2015. You may send your transmission at any time that day. If you have a wireless device, the transmission will be sent automatically. After your physician reviews your transmission, you will receive a postcard with your next transmission date.  Your physician recommends that you schedule a follow-up appointment in: 6 months with Dr.Croitoru  Your physician has recommended you make the following change in your medication: Decrease Digoxin to 0.125mg  daily

## 2015-08-19 ENCOUNTER — Encounter

## 2015-08-26 ENCOUNTER — Encounter: Payer: Self-pay | Admitting: Cardiovascular Disease

## 2015-11-11 ENCOUNTER — Ambulatory Visit (INDEPENDENT_AMBULATORY_CARE_PROVIDER_SITE_OTHER): Admitting: *Deleted

## 2015-11-11 DIAGNOSIS — I420 Dilated cardiomyopathy: Secondary | ICD-10-CM | POA: Diagnosis not present

## 2015-11-11 DIAGNOSIS — I5042 Chronic combined systolic (congestive) and diastolic (congestive) heart failure: Secondary | ICD-10-CM | POA: Diagnosis not present

## 2015-11-13 NOTE — Progress Notes (Signed)
Remote ICD transmission.   

## 2015-11-20 LAB — CUP PACEART REMOTE DEVICE CHECK
Battery Remaining Longevity: 82 mo
Brady Statistic AP VP Percent: 0 %
Brady Statistic AP VS Percent: 0 %
Brady Statistic AS VP Percent: 99.37 %
Brady Statistic AS VS Percent: 0.63 %
Brady Statistic RA Percent Paced: 0 %
Brady Statistic RV Percent Paced: 98.9 %
Date Time Interrogation Session: 20161129072604
HIGH POWER IMPEDANCE MEASURED VALUE: 41 Ohm
HighPow Impedance: 50 Ohm
Implantable Lead Implant Date: 20071217
Implantable Lead Implant Date: 20071217
Implantable Lead Implant Date: 20071217
Implantable Lead Location: 753859
Implantable Lead Location: 753860
Implantable Lead Model: 5076
Lead Channel Impedance Value: 399 Ohm
Lead Channel Impedance Value: 399 Ohm
Lead Channel Impedance Value: 646 Ohm
Lead Channel Impedance Value: 931 Ohm
Lead Channel Pacing Threshold Amplitude: 0.75 V
Lead Channel Pacing Threshold Amplitude: 1.25 V
Lead Channel Sensing Intrinsic Amplitude: 4.5 mV
Lead Channel Sensing Intrinsic Amplitude: 4.5 mV
Lead Channel Setting Pacing Amplitude: 2.25 V
Lead Channel Setting Pacing Pulse Width: 0.4 ms
MDC IDC LEAD LOCATION: 753858
MDC IDC MSMT BATTERY VOLTAGE: 3.03 V
MDC IDC MSMT LEADCHNL LV PACING THRESHOLD PULSEWIDTH: 0.8 ms
MDC IDC MSMT LEADCHNL RV IMPEDANCE VALUE: 304 Ohm
MDC IDC MSMT LEADCHNL RV IMPEDANCE VALUE: 361 Ohm
MDC IDC MSMT LEADCHNL RV PACING THRESHOLD PULSEWIDTH: 0.4 ms
MDC IDC SET LEADCHNL LV PACING PULSEWIDTH: 0.8 ms
MDC IDC SET LEADCHNL RV PACING AMPLITUDE: 2.5 V
MDC IDC SET LEADCHNL RV SENSING SENSITIVITY: 0.3 mV

## 2015-11-21 ENCOUNTER — Encounter: Payer: Self-pay | Admitting: Cardiology

## 2016-02-02 DIAGNOSIS — E039 Hypothyroidism, unspecified: Secondary | ICD-10-CM | POA: Insufficient documentation

## 2016-02-02 DIAGNOSIS — F1021 Alcohol dependence, in remission: Secondary | ICD-10-CM | POA: Insufficient documentation

## 2016-02-02 DIAGNOSIS — Z87891 Personal history of nicotine dependence: Secondary | ICD-10-CM | POA: Insufficient documentation

## 2016-02-02 DIAGNOSIS — I5032 Chronic diastolic (congestive) heart failure: Secondary | ICD-10-CM | POA: Insufficient documentation

## 2016-02-02 NOTE — Progress Notes (Signed)
Patient ID: Brian Craig, male   DOB: 12/20/1950, 65 y.o.   MRN: [id number]    Cardiology Office Note    Date:  02/03/2016   ID:  Brian Craig, DOB [birth year], MRN [id number]  PCP:  Gerilyn Pilgrim, FNP  Cardiologist:   Sanda Klein, MD   Chief Complaint  Patient presents with  . Follow-up    no chest pain, no shortness of breath, no edema, no pain or cramping in legs,no lightheadedness or dizziness    History of Present Illness:  Brian Craig is a 65 y.o. male who presents for permanent atrial fibrillation, nonischemic cardiomyopathy, complete heart block status post AV node ablation, biventricular defibrillator check.  Very well and denies any problems with shortness of breath, syncope, dizziness, palpitations, bleeding problems, focal neurological events. He has intermittent ankle edema and takes furosemide an average of 3 or 4 days a week only.  Device interrogation shows 99% biventricular pacing with another 1% triggered pacing. His thoracic impedance is within normal range. There has been no evidence of ventricular tachycardia. Estimated generator longevity is 6.3 years.  Brian Craig has a history of severe dilated cardiomyopathy related to uncontrolled atrial fibrillation with rapid ventricular response that started in the setting of thyrotoxicosis. His left ventricular ejection fraction was as low as 17%. Cardioversion failed. Eventually underwent AV node ablation and received a biventricular pacemaker-defibrillator in 2007. He underwent a generator change out in 2012 (atrial lead Medtronic 5076, ICD lead Medtronic [phone] Sprint Quattro secure, LV lead Medtronic 4194 Attain bipolar, Medtronic Viva XT gen change May 2016). His left ventricular ejection fraction has improved substantially since AV node ablation. His most recent echocardiogram performed in August 2015 shows normal left ventricular systolic function. He remains in permanent atrial fibrillation.     Past Medical History    Diagnosis Date  . CHF (congestive heart failure) ([place])   . OSA treated with BiPAP   . Graves disease     Past Surgical History  Procedure Laterality Date  . Biv icd genertaor change out Left 2012    Medtronic Protecta  . Ep implantable device N/A 05/06/2015    Procedure: ICD Generator Changeout;  Surgeon: Sanda Klein, MD;  Location: [facility];  Service: Cardiovascular;  Laterality: N/A;    Outpatient Prescriptions Prior to Visit  Medication Sig Dispense Refill  . acetaminophen (TYLENOL) 325 MG tablet Take 650 mg by mouth every 6 (six) hours as needed for headache.    . ALPRAZolam (XANAX) 0.5 MG tablet Take 0.5 mg by mouth 3 (three) times daily as needed for anxiety.    . carvedilol (COREG CR) 80 MG 24 hr capsule Take 1 capsule (80 mg total) by mouth daily. 90 capsule 3  . dabigatran (PRADAXA) 150 MG CAPS capsule Take 1 capsule (150 mg total) by mouth 2 (two) times daily. 180 capsule 3  . digoxin (LANOXIN) 0.125 MG tablet Take 1 tablet (0.125 mg total) by mouth daily. 90 tablet 3  . furosemide (LASIX) 40 MG tablet Take 1 tablet (40 mg total) by mouth daily. 90 tablet 3  . levothyroxine (SYNTHROID, LEVOTHROID) 200 MCG tablet Take 200 mcg by mouth daily before breakfast.    . lisinopril (PRINIVIL,ZESTRIL) 10 MG tablet Take 1 tablet (10 mg total) by mouth 2 (two) times daily. 180 tablet 1  . Multiple Vitamin (MULTIVITAMIN) tablet Take 1 tablet by mouth daily.    . potassium chloride (K-DUR,KLOR-CON) 10 MEQ tablet Take 1 tablet (10 mEq total) by mouth daily. 90 tablet 3  .  sertraline (ZOLOFT) 50 MG tablet Take 50 mg by mouth daily.    . simvastatin (ZOCOR) 40 MG tablet Take 1 tablet (40 mg total) by mouth daily. 90 tablet 3  . traMADol (ULTRAM) 50 MG tablet Take 1 tablet (50 mg total) by mouth every 6 (six) hours as needed for moderate pain. 20 tablet 0  . vardenafil (LEVITRA) 10 MG tablet Take 10 mg by mouth daily as needed for erectile dysfunction.     No  facility-administered medications prior to visit.     Allergies:   Review of patient's allergies indicates no known allergies.   Social History   Social History  . Marital Status: Married    Spouse Name: N/A  . Number of Children: N/A  . Years of Education: N/A   Social History Main Topics  . Smoking status: Current Every Day Smoker -- 0.50 packs/day    Types: Cigarettes  . Smokeless tobacco: None  . Alcohol Use: No  . Drug Use: No  . Sexual Activity: Not Asked   Other Topics Concern  . None   Social History Narrative     Family History:  The patient's family history includes Heart disease in his father, mother, paternal aunt, and paternal uncle.   ROS:   Please see the history of present illness.    ROS All other systems reviewed and are negative.   PHYSICAL EXAM:   VS:  BP 124/78 mmHg  Pulse 88  Ht 6\' 1"  (1.854 m)  Wt 145.151 kg (320 lb)  BMI 42.23 kg/m2   GEN: Well nourished, well developed, in no acute distress HEENT: normal Neck: no JVD, carotid bruits, or masses Cardiac: RRR, paradoxically split S2; no murmurs, rubs, or gallops,no edema ; healthy left subclavian ICD site Respiratory:  clear to auscultation bilaterally, normal work of breathing GI: soft, nontender, nondistended, + BS MS: no deformity or atrophy Skin: warm and dry, no rash Neuro:  Alert and Oriented x 3, Strength and sensation are intact Psych: euthymic mood, full affect  Wt Readings from Last 3 Encounters:  02/03/16 145.151 kg (320 lb)  08/12/15 140.161 kg (309 lb)  05/06/15 136.079 kg (300 lb)      Studies/Labs Reviewed:   EKG:  EKG is ordered today.  The ekg ordered today demonstrates atrial fibrillation, 100% V paced  Recent Labs: 04/30/2015: ALT 15; BUN 8; Creat 0.69; Hemoglobin 15.1; Platelets 230; Potassium 4.3; Sodium 139   Lipid Panel    Component Value Date/Time   CHOL 98 04/30/2015 1444   TRIG 148 04/30/2015 1444   HDL 29* 04/30/2015 1444   CHOLHDL 3.4 04/30/2015  1444   VLDL 30 04/30/2015 1444   LDLCALC 39 04/30/2015 1444      ASSESSMENT:    1. CHB (complete heart block) (H[facility])   2. Permanent atrial fibrillation ([place])   3. Biventricular ICD  implanted 2007, generator change in 2012   4. Chronic diastolic heart failure ([place])   5. Morbid obesity, unspecified obesity type ([place])   6. Tobacco abuse   7. Dyslipidemia (high LDL; low HDL)   8. Hypothyroidism following radioiodine therapy   9. History of alcohol dependence ([place])   10. Medication management      PLAN:  In order of problems listed above:   CHB (complete heart block) He is pacemaker dependent status post AV node ablation.  Permanent AFib On DOAC. CHADSvasc 1 (almost 2 - soon will be 65 yo).  Biventricular ICD implanted 2007, generator change in 2012 Normal  device function, at last check. No history of delivered appropriate or inappropriate defibrillator therapy. Excellent percentage of biventricular pacing.   Chronic diastolic CHF, NYHA class 1 Appears to be very well compensated. No clinical evidence of hypervolemia, NYHA functional class IB. He requires a low dose of loop diuretic for compensation and has not had acute heart failure exacerbation requiring dose adjustment her hospitalization. We have reduced the dose of digoxin in half and will discontinue this medication altogether. Complete recovery of left ventricular systolic function confirms that he primarily had tachycardia related cardiomyopathy.  Obesity/OSA on CPAP He reports compliance with therapy.Weight loss would be beneficial.  Tobacco abuse He stated that he does not have a plan to quit smoking. Again discussed the importance of smoking cessation.  Dyslipidemia (high LDL; low HDL) Excellent LDL on current statin therapy. We will not see improvement in HDL without substantial weight loss. Rechecl labs in May.  Hypothyroidism, postradioiodine therapy On hormone replacement therapy  Alcoholism in  recovery He is congratulated for more than 30 years of abstinence.    Medication Adjustments/Labs and Tests Ordered: Current medicines are reviewed at length with the patient today.  Concerns regarding medicines are outlined above.  Medication changes, Labs and Tests ordered today are listed in the Patient Instructions below. Patient Instructions  Your physician has recommended you make the following change in your medication: [place] physician recommends that you return for lab work in: at your convenience FASTING at [facility] [facility].  Remote monitoring is used to monitor your ICD from home. This monitoring reduces the number of office visits required to check your device to one time per year. It allows [place] to monitor the functioning of your device to ensure it is working properly. You are scheduled for a device check from home on May 03, 2016. You may send your transmission at any time that day. If you have a wireless device, the transmission will be sent automatically. After your physician reviews your transmission, you will receive a postcard with your next transmission date.  Dr. Sallyanne Kuster recommends that you schedule a follow-up appointment in: [place] ([facility]).           Mikael Spray, MD  02/03/2016 9:09 AM    [facility] [address], [place], [place]  [address] Phone: [phone]; Fax: [phone]

## 2016-02-03 ENCOUNTER — Encounter: Payer: Self-pay | Admitting: Cardiovascular Disease

## 2016-02-03 ENCOUNTER — Ambulatory Visit (INDEPENDENT_AMBULATORY_CARE_PROVIDER_SITE_OTHER): Admitting: Cardiovascular Disease

## 2016-02-03 VITALS — BP 124/78 | HR 88 | Ht 73.0 in | Wt 320.0 lb

## 2016-02-03 DIAGNOSIS — E785 Hyperlipidemia, unspecified: Secondary | ICD-10-CM

## 2016-02-03 DIAGNOSIS — Z79899 Other long term (current) drug therapy: Secondary | ICD-10-CM

## 2016-02-03 DIAGNOSIS — I4821 Permanent atrial fibrillation: Secondary | ICD-10-CM

## 2016-02-03 DIAGNOSIS — I442 Atrioventricular block, complete: Secondary | ICD-10-CM

## 2016-02-03 DIAGNOSIS — Z720 Tobacco use: Secondary | ICD-10-CM

## 2016-02-03 DIAGNOSIS — E890 Postprocedural hypothyroidism: Secondary | ICD-10-CM

## 2016-02-03 DIAGNOSIS — I482 Chronic atrial fibrillation: Secondary | ICD-10-CM

## 2016-02-03 DIAGNOSIS — Z95810 Presence of automatic (implantable) cardiac defibrillator: Secondary | ICD-10-CM

## 2016-02-03 DIAGNOSIS — E784 Other hyperlipidemia: Secondary | ICD-10-CM

## 2016-02-03 DIAGNOSIS — I5032 Chronic diastolic (congestive) heart failure: Secondary | ICD-10-CM | POA: Diagnosis not present

## 2016-02-03 DIAGNOSIS — F1021 Alcohol dependence, in remission: Secondary | ICD-10-CM

## 2016-02-03 NOTE — Patient Instructions (Signed)
Your physician has recommended you make the following change in your medication: [place] physician recommends that you return for lab work in: at your convenience FASTING at [facility].  Remote monitoring is used to monitor your ICD from home. This monitoring reduces the number of office visits required to check your device to one time per year. It allows [place] to monitor the functioning of your device to ensure it is working properly. You are scheduled for a device check from home on May 03, 2016. You may send your transmission at any time that day. If you have a wireless device, the transmission will be sent automatically. After your physician reviews your transmission, you will receive a postcard with your next transmission date.  Dr. Sallyanne Kuster recommends that you schedule a follow-up appointment in: [place] ([facility]).

## 2016-02-10 ENCOUNTER — Encounter: Admitting: Cardiovascular Disease

## 2016-02-21 ENCOUNTER — Other Ambulatory Visit: Payer: Self-pay | Admitting: Cardiovascular Disease

## 2016-05-10 LAB — CUP PACEART INCLINIC DEVICE CHECK
Battery Voltage: 3.01 V
Brady Statistic AP VP Percent: 0 %
Brady Statistic AS VP Percent: 99.52 %
Brady Statistic RA Percent Paced: 0 %
Date Time Interrogation Session: 20170221143049
HighPow Impedance: 42 Ohm
HighPow Impedance: 56 Ohm
Implantable Lead Implant Date: 20071217
Implantable Lead Implant Date: 20071217
Implantable Lead Implant Date: 20071217
Implantable Lead Location: 753858
Implantable Lead Location: 753860
Implantable Lead Model: 4194
Implantable Lead Model: 6947
Lead Channel Impedance Value: 342 Ohm
Lead Channel Impedance Value: 361 Ohm
Lead Channel Impedance Value: 399 Ohm
Lead Channel Impedance Value: 836 Ohm
Lead Channel Pacing Threshold Amplitude: 0.75 V
Lead Channel Pacing Threshold Pulse Width: 0.4 ms
Lead Channel Pacing Threshold Pulse Width: 0.8 ms
Lead Channel Sensing Intrinsic Amplitude: 3.75 mV
Lead Channel Sensing Intrinsic Amplitude: 3.75 mV
Lead Channel Setting Pacing Amplitude: 2.5 V
Lead Channel Setting Pacing Amplitude: 2.5 V
Lead Channel Setting Pacing Pulse Width: 0.8 ms
Lead Channel Setting Sensing Sensitivity: 0.3 mV
MDC IDC LEAD LOCATION: 753859
MDC IDC MSMT BATTERY REMAINING LONGEVITY: 76 mo
MDC IDC MSMT LEADCHNL LV IMPEDANCE VALUE: 608 Ohm
MDC IDC MSMT LEADCHNL LV PACING THRESHOLD AMPLITUDE: 1.125 V
MDC IDC MSMT LEADCHNL RV IMPEDANCE VALUE: 304 Ohm
MDC IDC SET LEADCHNL RV PACING PULSEWIDTH: 0.4 ms
MDC IDC STAT BRADY AP VS PERCENT: 0 %
MDC IDC STAT BRADY AS VS PERCENT: 0.48 %
MDC IDC STAT BRADY RV PERCENT PACED: 99.12 %

## 2016-05-18 ENCOUNTER — Ambulatory Visit (INDEPENDENT_AMBULATORY_CARE_PROVIDER_SITE_OTHER): Admitting: *Deleted

## 2016-05-18 DIAGNOSIS — I442 Atrioventricular block, complete: Secondary | ICD-10-CM

## 2016-05-18 DIAGNOSIS — I5042 Chronic combined systolic (congestive) and diastolic (congestive) heart failure: Secondary | ICD-10-CM | POA: Diagnosis not present

## 2016-05-18 DIAGNOSIS — I420 Dilated cardiomyopathy: Secondary | ICD-10-CM | POA: Diagnosis not present

## 2016-05-18 NOTE — Progress Notes (Signed)
Remote ICD transmission.   

## 2016-05-27 LAB — CUP PACEART REMOTE DEVICE CHECK
Battery Remaining Longevity: 62 mo
Brady Statistic AP VS Percent: 0 %
Brady Statistic RA Percent Paced: 0 %
HIGH POWER IMPEDANCE MEASURED VALUE: 49 Ohm
HighPow Impedance: 38 Ohm
Implantable Lead Implant Date: 20071217
Implantable Lead Implant Date: 20071217
Implantable Lead Location: 753858
Implantable Lead Location: 753859
Implantable Lead Location: 753860
Implantable Lead Model: 5076
Implantable Lead Model: 6947
Lead Channel Impedance Value: 285 Ohm
Lead Channel Impedance Value: 342 Ohm
Lead Channel Impedance Value: 342 Ohm
Lead Channel Impedance Value: 418 Ohm
Lead Channel Pacing Threshold Amplitude: 0.625 V
Lead Channel Pacing Threshold Pulse Width: 0.4 ms
Lead Channel Pacing Threshold Pulse Width: 0.8 ms
Lead Channel Sensing Intrinsic Amplitude: 4 mV
Lead Channel Setting Pacing Amplitude: 2.5 V
Lead Channel Setting Pacing Amplitude: 2.75 V
Lead Channel Setting Pacing Pulse Width: 0.8 ms
Lead Channel Setting Sensing Sensitivity: 0.3 mV
MDC IDC LEAD IMPLANT DT: 20071217
MDC IDC MSMT BATTERY VOLTAGE: 3.01 V
MDC IDC MSMT LEADCHNL LV IMPEDANCE VALUE: 589 Ohm
MDC IDC MSMT LEADCHNL LV IMPEDANCE VALUE: 817 Ohm
MDC IDC MSMT LEADCHNL LV PACING THRESHOLD AMPLITUDE: 1.625 V
MDC IDC MSMT LEADCHNL RA SENSING INTR AMPL: 4 mV
MDC IDC SESS DTM: 20170606052503
MDC IDC SET LEADCHNL RV PACING PULSEWIDTH: 0.4 ms
MDC IDC STAT BRADY AP VP PERCENT: 0 %
MDC IDC STAT BRADY AS VP PERCENT: 99.56 %
MDC IDC STAT BRADY AS VS PERCENT: 0.44 %
MDC IDC STAT BRADY RV PERCENT PACED: 99.39 %

## 2016-06-02 ENCOUNTER — Encounter: Payer: Self-pay | Admitting: Cardiology

## 2016-07-20 ENCOUNTER — Other Ambulatory Visit: Payer: Self-pay | Admitting: Cardiovascular Disease

## 2016-07-20 NOTE — Telephone Encounter (Signed)
Rx has been sent to the pharmacy electronically. ° °

## 2016-08-17 ENCOUNTER — Ambulatory Visit (INDEPENDENT_AMBULATORY_CARE_PROVIDER_SITE_OTHER): Admitting: *Deleted

## 2016-08-17 DIAGNOSIS — I5042 Chronic combined systolic (congestive) and diastolic (congestive) heart failure: Secondary | ICD-10-CM | POA: Diagnosis not present

## 2016-08-17 DIAGNOSIS — I442 Atrioventricular block, complete: Secondary | ICD-10-CM

## 2016-08-17 DIAGNOSIS — Z95810 Presence of automatic (implantable) cardiac defibrillator: Secondary | ICD-10-CM

## 2016-08-17 LAB — CUP PACEART REMOTE DEVICE CHECK
Battery Remaining Longevity: 57 mo
Brady Statistic AP VP Percent: 0 %
Brady Statistic AP VS Percent: 0 %
Brady Statistic AS VS Percent: 0.75 %
Brady Statistic RV Percent Paced: 98.95 %
Date Time Interrogation Session: 20170905084223
HIGH POWER IMPEDANCE MEASURED VALUE: 41 Ohm
HIGH POWER IMPEDANCE MEASURED VALUE: 51 Ohm
Implantable Lead Implant Date: 20071217
Implantable Lead Implant Date: 20071217
Implantable Lead Implant Date: 20071217
Implantable Lead Location: 753860
Implantable Lead Model: 4194
Implantable Lead Model: 5076
Lead Channel Impedance Value: 285 Ohm
Lead Channel Impedance Value: 361 Ohm
Lead Channel Impedance Value: 722 Ohm
Lead Channel Pacing Threshold Amplitude: 0.625 V
Lead Channel Pacing Threshold Amplitude: 1.5 V
Lead Channel Pacing Threshold Pulse Width: 0.4 ms
Lead Channel Pacing Threshold Pulse Width: 0.8 ms
Lead Channel Sensing Intrinsic Amplitude: 4.5 mV
Lead Channel Setting Pacing Amplitude: 2.5 V
Lead Channel Setting Pacing Pulse Width: 0.4 ms
Lead Channel Setting Pacing Pulse Width: 0.8 ms
Lead Channel Setting Sensing Sensitivity: 0.3 mV
MDC IDC LEAD LOCATION: 753858
MDC IDC LEAD LOCATION: 753859
MDC IDC MSMT BATTERY VOLTAGE: 3 V
MDC IDC MSMT LEADCHNL LV IMPEDANCE VALUE: 361 Ohm
MDC IDC MSMT LEADCHNL LV IMPEDANCE VALUE: 513 Ohm
MDC IDC MSMT LEADCHNL RA IMPEDANCE VALUE: 361 Ohm
MDC IDC MSMT LEADCHNL RA SENSING INTR AMPL: 4.5 mV
MDC IDC SET LEADCHNL LV PACING AMPLITUDE: 2.75 V
MDC IDC STAT BRADY AS VP PERCENT: 99.25 %
MDC IDC STAT BRADY RA PERCENT PACED: 0 %

## 2016-08-17 NOTE — Progress Notes (Signed)
Remote ICD transmission.   

## 2016-08-20 ENCOUNTER — Encounter: Payer: Self-pay | Admitting: Cardiology

## 2016-09-20 ENCOUNTER — Ambulatory Visit (INDEPENDENT_AMBULATORY_CARE_PROVIDER_SITE_OTHER): Admitting: Cardiovascular Disease

## 2016-09-20 ENCOUNTER — Encounter: Payer: Self-pay | Admitting: Cardiovascular Disease

## 2016-09-20 VITALS — BP 111/74 | HR 86 | Ht 73.0 in | Wt 323.6 lb

## 2016-09-20 DIAGNOSIS — Z79899 Other long term (current) drug therapy: Secondary | ICD-10-CM | POA: Diagnosis not present

## 2016-09-20 DIAGNOSIS — E785 Hyperlipidemia, unspecified: Secondary | ICD-10-CM

## 2016-09-20 DIAGNOSIS — G4733 Obstructive sleep apnea (adult) (pediatric): Secondary | ICD-10-CM

## 2016-09-20 DIAGNOSIS — Z9989 Dependence on other enabling machines and devices: Secondary | ICD-10-CM

## 2016-09-20 DIAGNOSIS — I5032 Chronic diastolic (congestive) heart failure: Secondary | ICD-10-CM

## 2016-09-20 DIAGNOSIS — F1021 Alcohol dependence, in remission: Secondary | ICD-10-CM

## 2016-09-20 DIAGNOSIS — Z720 Tobacco use: Secondary | ICD-10-CM

## 2016-09-20 DIAGNOSIS — I482 Chronic atrial fibrillation: Secondary | ICD-10-CM

## 2016-09-20 DIAGNOSIS — Z95810 Presence of automatic (implantable) cardiac defibrillator: Secondary | ICD-10-CM

## 2016-09-20 DIAGNOSIS — I442 Atrioventricular block, complete: Secondary | ICD-10-CM

## 2016-09-20 DIAGNOSIS — E890 Postprocedural hypothyroidism: Secondary | ICD-10-CM

## 2016-09-20 DIAGNOSIS — F1020 Alcohol dependence, uncomplicated: Secondary | ICD-10-CM

## 2016-09-20 DIAGNOSIS — I4821 Permanent atrial fibrillation: Secondary | ICD-10-CM

## 2016-09-20 DIAGNOSIS — Z7901 Long term (current) use of anticoagulants: Secondary | ICD-10-CM | POA: Diagnosis not present

## 2016-09-20 DIAGNOSIS — E784 Other hyperlipidemia: Secondary | ICD-10-CM

## 2016-09-20 LAB — LIPID PANEL
CHOL/HDL RATIO: 5 ratio (ref <=5.0)
Cholesterol: 164 mg/dL (ref 125–200)
HDL: 33 mg/dL — ABNORMAL LOW (ref >=40)
LDL Cholesterol: 104 mg/dL (ref <130)
Triglycerides: 137 mg/dL (ref <150)
VLDL: 27 mg/dL (ref <30)

## 2016-09-20 LAB — COMPREHENSIVE METABOLIC PANEL
ALT: 15 U/L (ref 9–46)
AST: 16 U/L (ref 10–35)
Albumin: 4 g/dL (ref 3.6–5.1)
Alkaline Phosphatase: 68 U/L (ref 40–115)
BUN: 11 mg/dL (ref 7–25)
CO2: 28 mmol/L (ref 20–31)
CREATININE: 1.06 mg/dL (ref 0.70–1.25)
Calcium: 9.3 mg/dL (ref 8.6–10.3)
Chloride: 102 mmol/L (ref 98–110)
Glucose, Bld: 99 mg/dL (ref 65–99)
Potassium: 4.6 mmol/L (ref 3.5–5.3)
SODIUM: 139 mmol/L (ref 135–146)
Total Bilirubin: 0.7 mg/dL (ref 0.2–1.2)
Total Protein: 6.7 g/dL (ref 6.1–8.1)

## 2016-09-20 LAB — CBC
HCT: 45.6 % (ref 38.5–50.0)
Hemoglobin: 15.2 g/dL (ref 13.2–17.1)
MCH: 32 pg (ref 27.0–33.0)
MCHC: 33.3 g/dL (ref 32.0–36.0)
MCV: 96 fL (ref 80.0–100.0)
MPV: 10 fL (ref 7.5–12.5)
PLATELETS: 255 10*3/uL (ref 140–400)
RBC: 4.75 MIL/uL (ref 4.20–5.80)
RDW: 12.9 % (ref 11.0–15.0)
WBC: 9.8 10*3/uL (ref 3.8–10.8)

## 2016-09-20 NOTE — Patient Instructions (Addendum)
Dr Sallyanne Kuster recommends that you continue on your current medications as directed. Please refer to the Current Medication list given to you today.  Your physician recommends that you return for lab work at your earliest [place].  Remote monitoring is used to monitor your Pacemaker of ICD from home. This monitoring reduces the number of office visits required to check your device to one time per year. It allows [place] to keep an eye on the functioning of your device to ensure it is working properly. You are scheduled for a device check from home on Monday, January 8th, 2018. You may send your transmission at any time that day. If you have a wireless device, the transmission will be sent automatically. After your physician reviews your transmission, you will receive a postcard with your next transmission date.  Dr Sallyanne Kuster recommends that you schedule a follow-up appointment in 6 months with a device check. You will receive a reminder letter in the mail two months in advance. If you don't receive a letter, please call our office to schedule the follow-up appointment.  If you need a refill on your cardiac medications before your next appointment, please call your pharmacy.

## 2016-09-20 NOTE — Progress Notes (Signed)
Patient ID: Brian Craig, male   DOB: [birth year], 65 y.o.   MRN: [id number]    Cardiology Office Note    Date:  09/20/2016   ID:  Brian Craig, DOB [birth year], MRN [id number]  PCP:  Gerilyn Pilgrim, FNP  Cardiologist:   Sanda Klein, MD   Chief Complaint  Patient presents with  . Follow-up    pt c/o DOE,   . Shortness of Breath    History of Present Illness:  Brian Craig is a 65 y.o. male who presents for permanent atrial fibrillation, nonischemic cardiomyopathy, complete heart block status post AV node ablation, biventricular defibrillator check.  Brian Craig has noticed some slight increase in dyspnea with activities such as taking out the trash or walking to the mailbox. He does not experienced dyspnea with personal hygiene. He becomes short of breath and dizzy if he bends over to feed or pet his cat.  Device interrogation shows 99.5% biventricular pacing and permanent atrial fibrillation with complete heart block lead parameters are excellent. His thoracic impedance (Optivol) has been steady, without clear evidence of hypervolemia. There has been no evidence of ventricular tachycardia. Estimated generator longevity is 5.8years.  Brian Craig has a history of severe dilated cardiomyopathy related to uncontrolled atrial fibrillation with rapid ventricular response that started in the setting of thyrotoxicosis. His left ventricular ejection fraction was as low as 17%. Cardioversion failed. Eventually underwent AV node ablation and received a biventricular pacemaker-defibrillator in 2007. He underwent a generator change out in 2012 (atrial lead Medtronic 5076, ICD lead Medtronic [phone] Sprint Quattro secure, LV lead Medtronic 4194 Attain bipolar, Medtronic Viva XT gen change May 2016). His left ventricular ejection fraction has improved substantially since AV node ablation. His most recent echocardiogram performed in August 2015 shows normal left ventricular systolic function. He remains in permanent  atrial fibrillation.   Past Medical History:  Diagnosis Date  . CHF (congestive heart failure) ([place])   . Graves disease   . OSA treated with BiPAP     Past Surgical History:  Procedure Laterality Date  . BIV ICD GENERTAOR CHANGE OUT Left 2012   Medtronic Protecta  . EP IMPLANTABLE DEVICE N/A 05/06/2015   Procedure: ICD Generator Changeout;  Surgeon: Sanda Klein, MD;  Location: [facility];  Service: Cardiovascular;  Laterality: N/A;    Outpatient Medications Prior to Visit  Medication Sig Dispense Refill  . acetaminophen (TYLENOL) 325 MG tablet Take 650 mg by mouth every 6 (six) hours as needed for headache.    . ALPRAZolam (XANAX) 0.5 MG tablet Take 0.5 mg by mouth 3 (three) times daily as needed for anxiety.    . COREG CR 80 MG 24 hr capsule TAKE 1 CAPSULE DAILY 90 capsule 2  . furosemide (LASIX) 40 MG tablet TAKE 1 TABLET DAILY 90 tablet 2  . KLOR-CON 10 10 MEQ tablet TAKE 1 TABLET DAILY 90 tablet 2  . levothyroxine (SYNTHROID, LEVOTHROID) 200 MCG tablet Take 200 mcg by mouth daily before breakfast.    . lisinopril (PRINIVIL,ZESTRIL) 10 MG tablet TAKE 1 TABLET TWICE A DAY 180 tablet 3  . Multiple Vitamin (MULTIVITAMIN) tablet Take 1 tablet by mouth daily.    Mar[place] Kitchen PRADAXA 150 MG CAPS capsule TAKE 1 CAPSULE TWICE DAILY 180 capsule 2  . sertraline (ZOLOFT) 50 MG tablet Take 50 mg by mouth daily.    . simvastatin (ZOCOR) 40 MG tablet TAKE 1 TABLET DAILY 90 tablet 2  . vardenafil (LEVITRA) 10 MG tablet Take 10 mg by mouth daily  as needed for erectile dysfunction.    . traMADol (ULTRAM) 50 MG tablet Take 1 tablet (50 mg total) by mouth every 6 (six) hours as needed for moderate pain. (Patient not taking: Reported on 09/20/2016) 20 tablet 0   No facility-administered medications prior to visit.      Allergies:   Review of patient's allergies indicates no known allergies.   Social History   Social History  . Marital status: Married    Spouse name: N/A  . Number of  children: N/A  . Years of education: N/A   Social History Main Topics  . Smoking status: Current Every Day Smoker    Packs/day: 0.50    Types: Cigarettes  . Smokeless tobacco: None  . Alcohol use No  . Drug use: No  . Sexual activity: Not Asked   Other Topics Concern  . None   Social History Narrative  . None     Family History:  The patient's family history includes Heart disease in his father, mother, paternal aunt, and paternal uncle.   ROS:   Please see the history of present illness.    ROS All other systems reviewed and are negative.   PHYSICAL EXAM:   VS:  BP 111/74 (BP Location: Right Arm, Patient Position: Sitting, Cuff Size: Large)   Pulse 86   Ht 6\' 1"  (1.854 m)   Wt (!) 323 lb 9.6 oz (146.8 kg)   SpO2 97%   BMI 42.69 kg/m    GEN: Well nourished, well developed, in no acute distress. Morbid obesity limits the exam  HEENT: normal  Neck: no JVD, carotid bruits, or masses Cardiac: RRR, paradoxically split S2; no murmurs, rubs, or gallops,no edema ; healthy left subclavian ICD site Respiratory:  clear to auscultation bilaterally, normal work of breathing GI: soft, nontender, nondistended, + BS MS: no deformity or atrophy  Skin: warm and dry, no rash Neuro:  Alert and Oriented x 3, Strength and sensation are intact Psych: euthymic mood, full affect  Wt Readings from Last 3 Encounters:  09/20/16 (!) 323 lb 9.6 oz (146.8 kg)  02/03/16 (!) 320 lb (145.2 kg)  08/12/15 (!) 309 lb (140.2 kg)      Studies/Labs Reviewed:   EKG:  EKG is ordered today.  The ekg ordered today demonstrates atrial fibrillation, 100% V paced  Recent Labs: No results found for requested labs within last 8760 hours.   Lipid Panel    Component Value Date/Time   CHOL 98 04/30/2015 1444   TRIG 148 04/30/2015 1444   HDL 29 (L) 04/30/2015 1444   CHOLHDL 3.4 04/30/2015 1444   VLDL 30 04/30/2015 1444   LDLCALC 39 04/30/2015 1444      ASSESSMENT:    1. CHB (complete heart  block) (HCC)   2. Permanent atrial fibrillation ([place])   3. Long term current use of anticoagulant   4. Biventricular ICD  implanted 2007, generator change in 2012   5. Chronic diastolic heart failure ([place])   6. OSA on CPAP   7. Morbid obesity ([place])   8. Tobacco abuse   9. Dyslipidemia (high LDL; low HDL)   10. Postablative hypothyroidism   11. Alcoholism in recovery ([place])   12. Medication management       PLAN:  In order of problems listed above:  1. CHB: Status post AV node ablation, pacemaker dependent 2. AFib: Permanent arrhythmia, rate control was not an issue since AV node ablation, on appropriate anticoagulation.. 3. Pradaxa anticoagulation is well tolerated,  without bleeding complications. CHADSvasc 1 (almost 2 - soon will be 65 yo). 4. CRT-D: normal device function with excellent biv pacing. Continue remote downloads every 3 months 5. CHF: Seems to describe slight worsening of functional status, now functional class II-III. However on physical exam and by thoracic impedance there is no overt hypervolemia. Also his activity level has remained very constant at 2.7 hours/day over the last 12 months. He is instructed to increase his diuretic intermittently 3 days a week to see if this will lead to improved symptoms. We did discontinue his digoxin at his last appointment, but I doubt that this is the reason. He appears to have gained some real weight and his obesity is clearly contributing to shortness of breath. Complete recovery of left ventricular systolic function confirms that he primarily had tachycardia related cardiomyopathy. 6. OSA on CPAP: Compliant with CPAP.  7. Obesity: Strongly recommend weight loss 8. Tobacco abuse: Recommended smoking cessation but he is not particularly interested. 9. Dyslipidemia (high LDL; low HDL): Time to reevaluate labs. 10. Hypothyroidism, postradioiodine therapy: Followed by his endocrinologist 11. Alcoholism in recovery: Remains  abstinent   Medication Adjustments/Labs and Tests Ordered: Current medicines are reviewed at length with the patient today.  Concerns regarding medicines are outlined above.  Medication changes, Labs and Tests ordered today are listed in the Patient Instructions below. Patient Instructions  Dr Sallyanne Kuster recommends that you continue on your current medications as directed. Please refer to the Current Medication list given to you today.  Your physician recommends that you return for lab work at your earliest [place].  Remote monitoring is used to monitor your Pacemaker of ICD from home. This monitoring reduces the number of office visits required to check your device to one time per year. It allows [place] to keep an eye on the functioning of your device to ensure it is working properly. You are scheduled for a device check from home on Monday, January 8th, 2018. You may send your transmission at any time that day. If you have a wireless device, the transmission will be sent automatically. After your physician reviews your transmission, you will receive a postcard with your next transmission date.  Dr Sallyanne Kuster recommends that you schedule a follow-up appointment in 6 months with a device check. You will receive a reminder letter in the mail two months in advance. If you don't receive a letter, please call our office to schedule the follow-up appointment.  If you need a refill on your cardiac medications before your next appointment, please call your pharmacy.      Signed, Sanda Klein, MD  09/20/2016 12:59 PM    [facility] [address], [place], [place]  [address] Phone: [phone]; Fax: [phone]

## 2016-09-22 ENCOUNTER — Telehealth: Payer: Self-pay

## 2016-09-22 DIAGNOSIS — Z79899 Other long term (current) drug therapy: Secondary | ICD-10-CM

## 2016-09-22 DIAGNOSIS — E785 Hyperlipidemia, unspecified: Secondary | ICD-10-CM

## 2016-09-22 MED ORDER — ROSUVASTATIN CALCIUM 10 MG PO TABS: 10.0000 mg | ORAL_TABLET | ORAL | 0 refills | Status: DC

## 2016-09-22 MED ORDER — ROSUVASTATIN CALCIUM 10 MG PO TABS: 10.0000 mg | ORAL_TABLET | ORAL | 3 refills | Status: DC

## 2016-09-22 NOTE — Telephone Encounter (Signed)
-----   Message from Sanda Klein, MD sent at 09/21/2016  8:41 AM EDT ----- Labs are generally okay. Cholesterol substantially higher than last year, although not bad. I suspect he is not taking the simvastatin? Truthfully, his LDL cholesterol is almost that the goal of less than 100. His HDL is still low, but her only improved with physical activity and weight loss.

## 2016-09-22 NOTE — Telephone Encounter (Signed)
Called patient with results. Patient has only been taking his simvastatin every few days due to muscle pain. Discussed with MCr. Per MCr - stop simvastatin, start Crestor 10 mg twice weekly, recheck CHOL in 3 months. Returned call to patient - medication sent to patient's requested pharmacies and labs ordered and will be mailed to patient.

## 2016-11-16 ENCOUNTER — Encounter

## 2016-12-20 ENCOUNTER — Ambulatory Visit (INDEPENDENT_AMBULATORY_CARE_PROVIDER_SITE_OTHER): Payer: Medicare Other | Admitting: *Deleted

## 2016-12-20 DIAGNOSIS — I442 Atrioventricular block, complete: Secondary | ICD-10-CM | POA: Diagnosis not present

## 2016-12-20 NOTE — Progress Notes (Signed)
Remote ICD transmission.   

## 2016-12-22 ENCOUNTER — Encounter: Payer: Self-pay | Admitting: Cardiology

## 2016-12-22 LAB — CUP PACEART REMOTE DEVICE CHECK
Battery Remaining Longevity: 66 mo
Battery Voltage: 3 V
Brady Statistic AP VS Percent: 0 %
Brady Statistic AS VS Percent: 0.84 %
Brady Statistic RA Percent Paced: 0 %
Brady Statistic RV Percent Paced: 98.78 %
HighPow Impedance: 41 Ohm
HighPow Impedance: 53 Ohm
Implantable Lead Implant Date: 20071217
Implantable Lead Implant Date: 20071217
Implantable Lead Implant Date: 20071217
Implantable Lead Location: 753858
Implantable Lead Location: 753860
Implantable Lead Model: 5076
Implantable Pulse Generator Implant Date: 20160524
Lead Channel Impedance Value: 285 Ohm
Lead Channel Impedance Value: 342 Ohm
Lead Channel Impedance Value: 342 Ohm
Lead Channel Pacing Threshold Amplitude: 0.75 V
Lead Channel Pacing Threshold Amplitude: 1.25 V
Lead Channel Pacing Threshold Pulse Width: 0.4 ms
Lead Channel Pacing Threshold Pulse Width: 0.8 ms
Lead Channel Sensing Intrinsic Amplitude: 4.375 mV
Lead Channel Sensing Intrinsic Amplitude: 4.375 mV
Lead Channel Setting Pacing Amplitude: 2.25 V
Lead Channel Setting Pacing Amplitude: 2.5 V
Lead Channel Setting Pacing Pulse Width: 0.4 ms
Lead Channel Setting Sensing Sensitivity: 0.3 mV
MDC IDC LEAD LOCATION: 753859
MDC IDC MSMT LEADCHNL LV IMPEDANCE VALUE: 551 Ohm
MDC IDC MSMT LEADCHNL LV IMPEDANCE VALUE: 817 Ohm
MDC IDC MSMT LEADCHNL RA IMPEDANCE VALUE: 399 Ohm
MDC IDC SESS DTM: 20180108072723
MDC IDC SET LEADCHNL LV PACING PULSEWIDTH: 0.8 ms
MDC IDC STAT BRADY AP VP PERCENT: 0 %
MDC IDC STAT BRADY AS VP PERCENT: 99.16 %

## 2017-01-03 ENCOUNTER — Telehealth: Payer: Self-pay | Admitting: Cardiovascular Disease

## 2017-01-03 NOTE — Telephone Encounter (Signed)
New message    Pt verbalized that he changed to simvastatin and he is no longer taking Crestor he wants Dr.Croitoru to write new prescription

## 2017-01-05 MED ORDER — SIMVASTATIN 40 MG PO TABS: 40.0000 mg | ORAL_TABLET | Freq: Every day | ORAL | 2 refills | Status: DC

## 2017-01-05 NOTE — Telephone Encounter (Signed)
Returned call to patient   He states he was given Rx for crestor after lab results from 09/2016 cause he thought he was having troubles with simvastatin and he was not taking med consistently. He was Rx'ed crestor but had issues with this med and went back on simvastatin 40mg  - office not notified. He needs Rx to [facility]. Rx(s) sent to pharmacy electronically.  Patient is aware he is due for fasting labs. He states he has been consistently taking simvastatin. Advised to have done at earliest convenience.

## 2017-01-05 NOTE — Telephone Encounter (Signed)
Ok, thanks.

## 2017-02-09 DIAGNOSIS — Z79899 Other long term (current) drug therapy: Secondary | ICD-10-CM | POA: Diagnosis not present

## 2017-02-09 DIAGNOSIS — E785 Hyperlipidemia, unspecified: Secondary | ICD-10-CM | POA: Diagnosis not present

## 2017-02-10 LAB — LIPID PANEL
CHOLESTEROL: 114 mg/dL (ref <200)
HDL: 32 mg/dL — ABNORMAL LOW (ref >40)
LDL Cholesterol: 57 mg/dL (ref <100)
TRIGLYCERIDES: 127 mg/dL (ref <150)
Total CHOL/HDL Ratio: 3.6 Ratio (ref <5.0)
VLDL: 25 mg/dL (ref <30)

## 2017-02-10 LAB — COMPREHENSIVE METABOLIC PANEL
ALK PHOS: 66 U/L (ref 40–115)
ALT: 13 U/L (ref 9–46)
AST: 15 U/L (ref 10–35)
Albumin: 4 g/dL (ref 3.6–5.1)
BUN: 9 mg/dL (ref 7–25)
CALCIUM: 9.3 mg/dL (ref 8.6–10.3)
CO2: 30 mmol/L (ref 20–31)
Chloride: 101 mmol/L (ref 98–110)
Creat: 1.04 mg/dL (ref 0.70–1.25)
Glucose, Bld: 106 mg/dL — ABNORMAL HIGH (ref 65–99)
POTASSIUM: 4.8 mmol/L (ref 3.5–5.3)
Sodium: 141 mmol/L (ref 135–146)
Total Bilirubin: 0.6 mg/dL (ref 0.2–1.2)
Total Protein: 6.6 g/dL (ref 6.1–8.1)

## 2017-02-17 ENCOUNTER — Other Ambulatory Visit: Payer: Self-pay | Admitting: Cardiovascular Disease

## 2017-02-17 NOTE — Telephone Encounter (Signed)
Rx(s) sent to pharmacy electronically.  

## 2017-02-21 ENCOUNTER — Encounter: Payer: Self-pay | Admitting: Adult Health

## 2017-02-21 ENCOUNTER — Ambulatory Visit (INDEPENDENT_AMBULATORY_CARE_PROVIDER_SITE_OTHER): Payer: Medicare Other | Admitting: Adult Health

## 2017-02-21 VITALS — BP 108/74 | HR 71 | Ht 73.0 in | Wt 331.7 lb

## 2017-02-21 DIAGNOSIS — R7309 Other abnormal glucose: Secondary | ICD-10-CM

## 2017-02-21 DIAGNOSIS — E890 Postprocedural hypothyroidism: Secondary | ICD-10-CM

## 2017-02-21 DIAGNOSIS — Z7901 Long term (current) use of anticoagulants: Secondary | ICD-10-CM

## 2017-02-21 DIAGNOSIS — F1021 Alcohol dependence, in remission: Secondary | ICD-10-CM

## 2017-02-21 MED ORDER — SERTRALINE HCL 50 MG PO TABS: 50.0000 mg | ORAL_TABLET | Freq: Every day | ORAL | 2 refills | Status: DC

## 2017-02-21 MED ORDER — ALPRAZOLAM 0.5 MG PO TABS: 0.5000 mg | ORAL_TABLET | Freq: Three times a day (TID) | ORAL | 0 refills | Status: DC | PRN

## 2017-02-21 NOTE — Progress Notes (Signed)
Subjective:    Patient ID: Brian Craig, male    DOB: 17-Nov-1951, 66 y.o.   MRN: [id number]  HPI:  Brian Craig presents to establish as a new pt.  He is a pleasant 52 year veteran of the [employer].  PMH:  HTN, Cardiomyopathy, OSA, morbid obesity, CHB, Hypothyroidism, Dyslipidemia, ICD implantation, A fib,OSA,and long-term anticoagulation.  [place] [facility] and annual endocrinology visits to manage various disorders.  He continue to use tobacco, 1/2 pack a day-smoking since age 6 .  Reports medication compliance, denies SE.  Last use of ETOH 1989.  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  He reports using nightly CPAP and declines smoking cessation assistance at today's encounter.    Patient Care Team    Relationship Specialty Notifications Start End  Odella Aquas, NP PCP - General Family Medicine  02/21/17   Brendolyn Patty, MD  Dermatology  02/21/17   Warden Fillers, MD Consulting Physician Ophthalmology  02/21/17   Amalia Greenhouse, MD Referring Physician Endocrinology  02/21/17   Sanda Klein, MD Consulting Physician Cardiology  02/21/17     Patient Active Problem List   Diagnosis Date Noted  . Long term current use of anticoagulant 09/20/2016  . Chronic diastolic heart failure ([place]) 02/02/2016  . Hypothyroidism 02/02/2016  . History of alcohol dependence ([place]) 02/02/2016  . ICD (implantable cardioverter-defibrillator) battery depletion 04/17/2015  . Congestive dilated cardiomyopathy ([place]) 07/15/2014  . Chronic combined systolic and diastolic CHF, NYHA class 1 ([place]) 07/15/2014  . OSA on CPAP 07/15/2014  . Morbid obesity ([place]) 07/15/2014  . Permanent atrial fibrillation ([place]) 07/15/2014  . CHB (complete heart block) ([place]) 07/15/2014  . Biventricular ICD  implanted 2007, generator change in 2012 07/15/2014  . Tobacco abuse 07/15/2014  . Alcoholism in recovery ([place]) 07/15/2014  . Hypothyroidism, postradioiodine therapy 07/15/2014  . Dyslipidemia (high LDL;  low HDL) 07/15/2014     Past Medical History:  Diagnosis Date  . Atrial fibrillation ([place])   . CHF (congestive heart failure) ([place])   . Graves disease    Graves  . Hyperlipidemia   . Hypertension   . OSA treated with BiPAP   . Substance abuse    alcohol     Past Surgical History:  Procedure Laterality Date  . APPENDECTOMY    . BIV ICD GENERTAOR CHANGE OUT Left 2012   Medtronic Protecta  . CARDIAC ELECTROPHYSIOLOGY STUDY AND ABLATION    . EP IMPLANTABLE DEVICE N/A 05/06/2015   Procedure: ICD Generator Changeout;  Surgeon: Sanda Klein, MD;  Location: [facility];  Service: Cardiovascular;  Laterality: N/A;  . HERNIA REPAIR    . MENISCUS REPAIR Right   . VASECTOMY     unilateral due to injury     Family History  Problem Relation Age of Onset  . Heart disease Mother   . Hypertension Mother   . Heart disease Father   . Hyperlipidemia Father   . Hypertension Father   . Heart attack Father   . Heart disease Paternal Aunt   . Heart disease Paternal Uncle   . Alcohol abuse Maternal Uncle   . Alcohol abuse Maternal Grandfather      History  Drug Use No     History  Alcohol Use No    Comment: recovering alcoholic     History  Smoking Status  . Current Every Day Smoker  . Packs/day: 0.50  . Years: 51.00  . Types: Cigarettes  Smokeless Tobacco  .  Never Used     Outpatient Encounter Prescriptions as of 02/21/2017  Medication Sig  . acetaminophen (TYLENOL) 325 MG tablet Take 650 mg by mouth every 6 (six) hours as needed for headache.  . ALPRAZolam (XANAX) 0.5 MG tablet Take 1 tablet (0.5 mg total) by mouth 3 (three) times daily as needed for anxiety.  . COREG CR 80 MG 24 hr capsule TAKE 1 CAPSULE DAILY  . furosemide (LASIX) 40 MG tablet TAKE 1 TABLET DAILY  . KLOR-CON 10 10 MEQ tablet TAKE 1 TABLET DAILY  . levothyroxine (SYNTHROID, LEVOTHROID) 200 MCG tablet Take 200 mcg by mouth daily before breakfast.  . lisinopril (PRINIVIL,ZESTRIL) 10 MG  tablet TAKE 1 TABLET TWICE A DAY  . Multiple Vitamin (MULTIVITAMIN) tablet Take 1 tablet by mouth daily.  Marland Kitchen PRADAXA 150 MG CAPS capsule TAKE 1 CAPSULE TWICE DAILY  . rosuvastatin (CRESTOR) 10 MG tablet Take 10 mg by mouth 2 (two) times a week.  . sertraline (ZOLOFT) 50 MG tablet Take 1 tablet (50 mg total) by mouth daily.  . [DISCONTINUED] ALPRAZolam (XANAX) 0.5 MG tablet Take 0.5 mg by mouth 3 (three) times daily as needed for anxiety.  . [DISCONTINUED] sertraline (ZOLOFT) 50 MG tablet Take 50 mg by mouth daily.  . [DISCONTINUED] simvastatin (ZOCOR) 40 MG tablet Take 1 tablet (40 mg total) by mouth at bedtime.  . [DISCONTINUED] vardenafil (LEVITRA) 10 MG tablet Take 10 mg by mouth daily as needed for erectile dysfunction.   No facility-administered encounter medications on file as of 02/21/2017.     Allergies: Patient has no known allergies.  Body mass index is 43.76 kg/m.  Blood pressure 108/74, pulse 71, height 6\' 1"  (1.854 m), weight (!) 331 lb 11.2 oz (150.5 kg).    Review of Systems  Constitutional: Positive for fatigue. Negative for activity change, appetite change, chills, diaphoresis, fever and unexpected weight change.  HENT: Negative for congestion.   Eyes: Negative for visual disturbance.  Respiratory: Negative for cough, chest tightness, shortness of breath, wheezing and stridor.   Cardiovascular: Positive for leg swelling. Negative for chest pain and palpitations.       Lower ext edema when he forgets daily furosemide.  Gastrointestinal: Negative for abdominal distention, abdominal pain, blood in stool, constipation, diarrhea, nausea and vomiting.  Endocrine: Negative for cold intolerance, heat intolerance, polydipsia, polyphagia and polyuria.  Genitourinary: Negative for difficulty urinating and flank pain.  Musculoskeletal: Positive for arthralgias and myalgias. Negative for back pain, gait problem, joint swelling, neck pain and neck stiffness.  Skin: Negative for  color change, pallor, rash and wound.  Neurological: Negative for dizziness, tremors, weakness, light-headedness and headaches.  Psychiatric/Behavioral: Negative for agitation, self-injury, sleep disturbance and suicidal ideas. The patient is not nervous/anxious.        Objective:   Physical Exam  Constitutional: He is oriented to person, place, and time. He appears well-developed. No distress.  HENT:  Head: Normocephalic and atraumatic.  Right Ear: External ear normal.  Left Ear: External ear normal.  Eyes: Conjunctivae are normal. Pupils are equal, round, and reactive to light.  Neck: Normal range of motion.  Cardiovascular: Normal rate, normal heart sounds and intact distal pulses.  An irregularly irregular rhythm present. Exam reveals no gallop.   Pulmonary/Chest: Effort normal and breath sounds normal. No respiratory distress. He has no wheezes. He has no rales. He exhibits no tenderness.  Abdominal: Soft. Bowel sounds are normal. He exhibits no distension. There is no tenderness. There is no CVA tenderness. A  hernia is present. Hernia confirmed positive in the ventral area.  Extremely protuberant abdomen. Large, reducable ventral hernia.  Musculoskeletal: Normal range of motion.       Right ankle: He exhibits no swelling.       Left ankle: He exhibits no swelling.  Lymphadenopathy:    He has no cervical adenopathy.  Neurological: He is alert and oriented to person, place, and time.  Skin: Skin is warm and dry. No rash noted. He is not diaphoretic. No erythema. No pallor.  Psychiatric: He has a normal mood and affect. His behavior is normal. Judgment and thought content normal.          Assessment & Plan:   1. Hypothyroidism, postradioiodine therapy   2. History of alcohol dependence ([facility])   3. Elevated glucose   4. Long term current use of anticoagulant   5. Morbid obesity ([place])     Hypothyroidism, postradioiodine therapy Checked TSH today. Continue with  Endocrinologist.   History of alcohol dependence ([place]) Last use of ETOH was 1989.  Long term current use of anticoagulant Continue pradaxa 150mg  daily and bi-annual cardiology f/u. Stressed the importance of smoking cessation-declined today.  Morbid obesity Increase regular movement and follow heart healthy diet.  BMI today 43.76.     FOLLOW-UP:  Return in about 6 months (around 08/24/2017) for Regular Follow Up.

## 2017-02-21 NOTE — Assessment & Plan Note (Signed)
Checked TSH today. Continue with Endocrinologist.

## 2017-02-21 NOTE — Assessment & Plan Note (Signed)
>>  ASSESSMENT AND PLAN FOR HYPOTHYROIDISM, POSTRADIOIODINE THERAPY WRITTEN ON 02/21/2017  1:25 PM BY BESS, KATY D, NP  Checked TSH today. Continue with Endocrinologist.

## 2017-02-21 NOTE — Patient Instructions (Signed)
Heart-Healthy Eating Plan Many factors influence your heart health, including eating and exercise habits. Heart (coronary) risk increases with abnormal blood fat (lipid) levels. Heart-healthy meal planning includes limiting unhealthy fats, increasing healthy fats, and making other small dietary changes. This includes maintaining a healthy body weight to help keep lipid levels within a normal range. What is my plan? Your health care provider recommends that you:  Get no more than _________% of the total calories in your daily diet from fat.  Limit your intake of saturated fat to less than _________% of your total calories each day.  Limit the amount of cholesterol in your diet to less than _________ mg per day. What types of fat should I choose?  Choose healthy fats more often. Choose monounsaturated and polyunsaturated fats, such as olive oil and canola oil, flaxseeds, walnuts, almonds, and seeds.  Eat more omega-3 fats. Good choices include salmon, mackerel, sardines, tuna, flaxseed oil, and ground flaxseeds. Aim to eat fish at least two times each week.  Limit saturated fats. Saturated fats are primarily found in animal products, such as meats, butter, and cream. Plant sources of saturated fats include palm oil, palm kernel oil, and coconut oil.  Avoid foods with partially hydrogenated oils in them. These contain trans fats. Examples of foods that contain trans fats are stick margarine, some tub margarines, cookies, crackers, and other baked goods. What general guidelines do I need to follow?  Check food labels carefully to identify foods with trans fats or high amounts of saturated fat.  Fill one half of your plate with vegetables and green salads. Eat 4-5 servings of vegetables per day. A serving of vegetables equals 1 cup of raw leafy vegetables,  cup of raw or cooked cut-up vegetables, or  cup of vegetable juice.  Fill one fourth of your plate with whole grains. Look for the word  "whole" as the first word in the ingredient list.  Fill one fourth of your plate with lean protein foods.  Eat 4-5 servings of fruit per day. A serving of fruit equals one medium whole fruit,  cup of dried fruit,  cup of fresh, frozen, or canned fruit, or  cup of 100% fruit juice.  Eat more foods that contain soluble fiber. Examples of foods that contain this type of fiber are apples, broccoli, carrots, beans, peas, and barley. Aim to get 20-30 g of fiber per day.  Eat more home-cooked food and less restaurant, buffet, and fast food.  Limit or avoid alcohol.  Limit foods that are high in starch and sugar.  Avoid fried foods.  Cook foods by using methods other than frying. Baking, boiling, grilling, and broiling are all great options. Other fat-reducing suggestions include:  Removing the skin from poultry.  Removing all visible fats from meats.  Skimming the fat off of stews, soups, and gravies before serving them.  Steaming vegetables in water or broth.  Lose weight if you are overweight. Losing just 5-10% of your initial body weight can help your overall health and prevent diseases such as diabetes and heart disease.  Increase your consumption of nuts, legumes, and seeds to 4-5 servings per week. One serving of dried beans or legumes equals  cup after being cooked, one serving of nuts equals 1 ounces, and one serving of seeds equals  ounce or 1 tablespoon.  You may need to monitor your salt (sodium) intake, especially if you have high blood pressure. Talk with your health care provider or dietitian to get more  information about reducing sodium. What foods can I eat? Grains   Breads, including [place], white, pita, wheat, raisin, rye, oatmeal, and [place]. Tortillas that are neither fried nor made with lard or trans fat. Low-fat rolls, including hotdog and hamburger buns and English muffins. Biscuits. Muffins. Waffles. Pancakes. Light popcorn. Whole-grain cereals. Flatbread.  Melba toast. Pretzels. Breadsticks. Rusks. Low-fat snacks and crackers, including oyster, saltine, matzo, graham, animal, and rye. Rice and pasta, including [place] rice and those that are made with whole wheat. Vegetables  All vegetables. Fruits  All fruits, but limit coconut. Meats and Other Protein Sources  Lean, well-trimmed beef, veal, pork, and lamb. Chicken and [place] without skin. All fish and shellfish. Wild duck, rabbit, pheasant, and venison. Egg whites or low-cholesterol egg substitutes. Dried beans, peas, lentils, and tofu.Seeds and most nuts. Dairy  Low-fat or nonfat cheeses, including ricotta, string, and mozzarella. Skim or 1% milk that is liquid, powdered, or evaporated. Buttermilk that is made with low-fat milk. Nonfat or low-fat yogurt. Beverages  Mineral water. Diet carbonated beverages. Sweets and Desserts  Sherbets and fruit ices. Honey, jam, marmalade, jelly, and syrups. Meringues and gelatins. Pure sugar candy, such as hard candy, jelly beans, gumdrops, mints, marshmallows, and small amounts of dark chocolate. [employer] Eat all sweets and desserts in moderation. Fats and Oils  Nonhydrogenated (trans-free) margarines. Vegetable oils, including soybean, sesame, sunflower, olive, peanut, safflower, corn, canola, and cottonseed. Salad dressings or mayonnaise that are made with a vegetable oil. Limit added fats and oils that you use for cooking, baking, salads, and as spreads. Other  Cocoa powder. Coffee and tea. All seasonings and condiments. The items listed above may not be a complete list of recommended foods or beverages. Contact your dietitian for more options.  What foods are not recommended? Grains  Breads that are made with saturated or trans fats, oils, or whole milk. Croissants. Butter rolls. Cheese breads. Sweet rolls. Donuts. Buttered popcorn. Chow mein noodles. High-fat crackers, such as cheese or butter crackers. Meats and Other Protein Sources  Fatty  meats, such as hotdogs, short ribs, sausage, spareribs, bacon, ribeye roast or steak, and mutton. High-fat deli meats, such as salami and bologna. Caviar. Domestic duck and goose. Organ meats, such as kidney, liver, sweetbreads, brains, gizzard, chitterlings, and heart. Dairy  Cream, sour cream, cream cheese, and creamed cottage cheese. Whole milk cheeses, including blue (bleu), Monte[place] Jack, [place], [place], American, [place], Swiss, [place], [place], and [place]. Whole or 2% milk that is liquid, evaporated, or condensed. Whole buttermilk. Cream sauce or high-fat cheese sauce. Yogurt that is made from whole milk. Beverages  Regular sodas and drinks with added sugar. Sweets and Desserts  Frosting. Pudding. Cookies. Cakes other than angel food cake. Candy that has milk chocolate or white chocolate, hydrogenated fat, butter, coconut, or unknown ingredients. Buttered syrups. Full-fat ice cream or ice cream drinks. Fats and Oils  Gravy that has suet, meat fat, or shortening. Cocoa butter, hydrogenated oils, palm oil, coconut oil, palm kernel oil. These can often be found in baked products, candy, fried foods, nondairy creamers, and whipped toppings. Solid fats and shortenings, including bacon fat, salt pork, lard, and butter. Nondairy cream substitutes, such as coffee creamers and sour cream substitutes. Salad dressings that are made of unknown oils, cheese, or sour cream. The items listed above may not be a complete list of foods and beverages to avoid. Contact your dietitian for more information.  This information is not intended to replace advice given to you by  your health care provider. Make sure you discuss any questions you have with your health care provider. Document Released: 09/07/2008 Document Revised: 06/18/2016 Document Reviewed: 05/23/2014 Elsevier Interactive Patient Education  2017 [employer].   Exercising to [employer] Exercising can help you to lose weight. In order to lose weight  through exercise, you need to do vigorous-intensity exercise. You can tell that you are exercising with vigorous intensity if you are breathing very hard and fast and cannot hold a conversation while exercising. Moderate-intensity exercise helps to maintain your current weight. You can tell that you are exercising at a moderate level if you have a higher heart rate and faster breathing, but you are still able to hold a conversation. How often should I exercise? Choose an activity that you enjoy and set realistic goals. Your health care provider can help you to make an activity plan that works for you. Exercise regularly as directed by your health care provider. This may include:  Doing resistance training twice each week, such as:  Push-ups.  Sit-ups.  Lifting weights.  Using resistance bands.  Doing a given intensity of exercise for a given amount of time. Choose from these options:  150 minutes of moderate-intensity exercise every week.  75 minutes of vigorous-intensity exercise every week.  A mix of moderate-intensity and vigorous-intensity exercise every week. Children, pregnant women, people who are out of shape, people who are overweight, and older adults may need to consult a health care provider for individual recommendations. If you have any sort of medical condition, be sure to consult your health care provider before starting a new exercise program. What are some activities that can help me to lose weight?  Walking at a rate of at least 4.5 miles an hour.  Jogging or running at a rate of 5 miles per hour.  Biking at a rate of at least 10 miles per hour.  Lap swimming.  Roller-skating or in-line skating.  Cross-country skiing.  Vigorous competitive sports, such as football, basketball, and soccer.  Jumping rope.  Aerobic dancing. How can I be more active in my day-to-day activities?  Use the stairs instead of the elevator.  Take a walk during your lunch  break.  If you drive, park your car farther away from work or school.  If you take public transportation, get off one stop early and walk the rest of the way.  Make all of your phone calls while standing up and walking around.  Get up, stretch, and walk around every 30 minutes throughout the day. What guidelines should I follow while exercising?  Do not exercise so much that you hurt yourself, feel dizzy, or get very short of breath.  Consult your health care provider prior to starting a new exercise program.  Wear comfortable clothes and shoes with good support.  Drink plenty of water while you exercise to prevent dehydration or heat stroke. Body water is lost during exercise and must be replaced.  Work out until you breathe faster and your heart beats faster. This information is not intended to replace advice given to you by your health care provider. Make sure you discuss any questions you have with your health care provider. Document Released: 01/01/2011 Document Revised: 05/06/2016 Document Reviewed: 05/02/2014 Elsevier Interactive Patient Education  2017 [employer].

## 2017-02-21 NOTE — Assessment & Plan Note (Signed)
Last use of ETOH was 1989.

## 2017-02-21 NOTE — Assessment & Plan Note (Signed)
Continue pradaxa 150mg  daily and bi-annual cardiology f/u. Stressed the importance of smoking cessation-declined today.

## 2017-02-21 NOTE — Assessment & Plan Note (Signed)
Increase regular movement and follow heart healthy diet.  BMI today 43.76.

## 2017-02-22 LAB — TSH: TSH: 2.73 u[IU]/mL (ref 0.450–4.500)

## 2017-02-22 LAB — CBC WITH DIFFERENTIAL/PLATELET
Basophils Absolute: 0 10*3/uL (ref 0.0–0.2)
Basos: 1 %
EOS (ABSOLUTE): 0.1 10*3/uL (ref 0.0–0.4)
EOS: 1 %
HEMATOCRIT: 43.4 % (ref 37.5–51.0)
Hemoglobin: 14.6 g/dL (ref 13.0–17.7)
IMMATURE GRANS (ABS): 0 10*3/uL (ref 0.0–0.1)
Immature Granulocytes: 0 %
Lymphocytes Absolute: 2.5 10*3/uL (ref 0.7–3.1)
Lymphs: 29 %
MCH: 32.2 pg (ref 26.6–33.0)
MCHC: 33.6 g/dL (ref 31.5–35.7)
MCV: 96 fL (ref 79–97)
Monocytes Absolute: 0.8 10*3/uL (ref 0.1–0.9)
Monocytes: 9 %
NEUTROS PCT: 60 %
Neutrophils Absolute: 5.1 10*3/uL (ref 1.4–7.0)
Platelets: 247 10*3/uL (ref 150–379)
RBC: 4.53 x10E6/uL (ref 4.14–5.80)
RDW: 12.7 % (ref 12.3–15.4)
WBC: 8.6 10*3/uL (ref 3.4–10.8)

## 2017-02-22 LAB — HEMOGLOBIN A1C
ESTIMATED AVERAGE GLUCOSE: 111 mg/dL
Hgb A1c MFr Bld: 5.5 % (ref 4.8–5.6)

## 2017-02-22 LAB — VITAMIN D 25 HYDROXY (VIT D DEFICIENCY, FRACTURES): Vit D, 25-Hydroxy: 29.8 ng/mL — ABNORMAL LOW (ref 30.0–100.0)

## 2017-02-22 LAB — VITAMIN B12: VITAMIN B 12: 350 pg/mL (ref 232–1245)

## 2017-03-04 DIAGNOSIS — E559 Vitamin D deficiency, unspecified: Secondary | ICD-10-CM | POA: Insufficient documentation

## 2017-03-15 ENCOUNTER — Ambulatory Visit (INDEPENDENT_AMBULATORY_CARE_PROVIDER_SITE_OTHER): Payer: Medicare Other | Admitting: Cardiovascular Disease

## 2017-03-15 ENCOUNTER — Encounter: Payer: Self-pay | Admitting: Cardiovascular Disease

## 2017-03-15 VITALS — BP 100/60 | HR 74 | Ht 73.0 in | Wt 330.0 lb

## 2017-03-15 DIAGNOSIS — I482 Chronic atrial fibrillation: Secondary | ICD-10-CM | POA: Diagnosis not present

## 2017-03-15 DIAGNOSIS — I420 Dilated cardiomyopathy: Secondary | ICD-10-CM | POA: Diagnosis not present

## 2017-03-15 DIAGNOSIS — G4733 Obstructive sleep apnea (adult) (pediatric): Secondary | ICD-10-CM | POA: Diagnosis not present

## 2017-03-15 DIAGNOSIS — I5032 Chronic diastolic (congestive) heart failure: Secondary | ICD-10-CM

## 2017-03-15 DIAGNOSIS — Z7901 Long term (current) use of anticoagulants: Secondary | ICD-10-CM | POA: Diagnosis not present

## 2017-03-15 DIAGNOSIS — Z95810 Presence of automatic (implantable) cardiac defibrillator: Secondary | ICD-10-CM | POA: Diagnosis not present

## 2017-03-15 DIAGNOSIS — E784 Other hyperlipidemia: Secondary | ICD-10-CM

## 2017-03-15 DIAGNOSIS — E890 Postprocedural hypothyroidism: Secondary | ICD-10-CM | POA: Diagnosis not present

## 2017-03-15 DIAGNOSIS — F1021 Alcohol dependence, in remission: Secondary | ICD-10-CM

## 2017-03-15 DIAGNOSIS — I442 Atrioventricular block, complete: Secondary | ICD-10-CM

## 2017-03-15 DIAGNOSIS — I4821 Permanent atrial fibrillation: Secondary | ICD-10-CM

## 2017-03-15 DIAGNOSIS — Z9989 Dependence on other enabling machines and devices: Secondary | ICD-10-CM

## 2017-03-15 DIAGNOSIS — E785 Hyperlipidemia, unspecified: Secondary | ICD-10-CM

## 2017-03-15 MED ORDER — LISINOPRIL 10 MG PO TABS: 10.0000 mg | ORAL_TABLET | Freq: Every day | ORAL | 3 refills | Status: DC

## 2017-03-15 NOTE — Patient Instructions (Signed)
Medication Instructions: Your Lisinopril has decreased to 10 mg once a day.   Follow-Up: Remote monitoring is used to monitor your Pacemaker of ICD from home. This monitoring reduces the number of office visits required to check your device to one time per year. It allows [place] to keep an eye on the functioning of your device to ensure it is working properly. You are scheduled for a device check from home on Tuesday July 3rd. You may send your transmission at any time that day. If you have a wireless device, the transmission will be sent automatically. After your physician reviews your transmission, you will receive a postcard with your next transmission date.    Your physician wants you to follow-up in 6 months with Dr. Sallyanne [facility]. You will receive a reminder letter in the mail two months in advance. If you don't receive a letter, please call our office to schedule the follow-up appointment.     If you need a refill on your cardiac medications before your next appointment, please call your pharmacy.

## 2017-03-15 NOTE — Progress Notes (Signed)
Patient ID: Brian Craig, male   DOB: 1951/06/23, 66 y.o.   MRN: [id number]    Cardiology Office Note    Date:  03/15/2017   ID:  Brian Craig, DOB [birth year], MRN [id number]  PCP:  Mellody Dance, DO  Cardiologist:   Sanda Klein, MD   Chief Complaint  Patient presents with  . Follow-up    History of Present Illness:  Brian Craig is a 66 y.o. male who presents for permanent atrial fibrillation, nonischemic cardiomyopathy, complete heart block status post AV node ablation, biventricular defibrillator check.  He has been doing well and has not had problems with exertional dyspnea, exertional angina, palpitations, syncope, leg edema, orthopnea or PND or focal neurological complaints. He has occasional dizziness. Systolic blood pressures commonly in the low 100 range. Denies any bleeding problems on chronic anticoagulation with Pradaxa. He did not tolerate Crestor which caused worsening myalgias and simvastatin and is back on the original drug.  Device interrogation shows 99% biventricular pacing and permanent atrial fibrillation with complete heart block. Lead parameters are excellent. His thoracic impedance (Optivol) has been steady, without evidence of hypervolemia. There has been no evidence of ventricular tachycardia. Estimated generator longevity is 5 years.  [name] has a history of severe dilated cardiomyopathy related to uncontrolled atrial fibrillation with rapid ventricular response that started in the setting of thyrotoxicosis. His left ventricular ejection fraction was as low as 17%. Cardioversion failed. Eventually underwent AV node ablation and received a biventricular pacemaker-defibrillator in 2007. He underwent a generator change out in 2012 (atrial lead Medtronic 5076, ICD lead Medtronic [phone] Sprint Quattro secure, LV lead Medtronic 4194 Attain bipolar, Medtronic Viva XT gen change May 2016). His left ventricular ejection fraction has improved substantially since AV node  ablation. His most recent echocardiogram performed in August 2015 shows normal left ventricular systolic function. He remains in permanent atrial fibrillation.   Past Medical History:  Diagnosis Date  . Atrial fibrillation ([place])   . CHF (congestive heart failure) ([place])   . Graves disease    Graves  . Hyperlipidemia   . Hypertension   . OSA treated with BiPAP   . Substance abuse    alcohol    Past Surgical History:  Procedure Laterality Date  . APPENDECTOMY    . BIV ICD GENERTAOR CHANGE OUT Left 2012   Medtronic Protecta  . CARDIAC ELECTROPHYSIOLOGY STUDY AND ABLATION    . EP IMPLANTABLE DEVICE N/A 05/06/2015   Procedure: ICD Generator Changeout;  Surgeon: Sanda Klein, MD;  Location: [facility];  Service: Cardiovascular;  Laterality: N/A;  . HERNIA REPAIR    . MENISCUS REPAIR Right   . VASECTOMY     unilateral due to injury    Outpatient Medications Prior to Visit  Medication Sig Dispense Refill  . acetaminophen (TYLENOL) 325 MG tablet Take 650 mg by mouth every 6 (six) hours as needed for headache.    . ALPRAZolam (XANAX) 0.5 MG tablet Take 1 tablet (0.5 mg total) by mouth 3 (three) times daily as needed for anxiety. 30 tablet 0  . COREG CR 80 MG 24 hr capsule TAKE 1 CAPSULE DAILY 90 capsule 2  . furosemide (LASIX) 40 MG tablet TAKE 1 TABLET DAILY 90 tablet 2  . KLOR-CON 10 10 MEQ tablet TAKE 1 TABLET DAILY 90 tablet 2  . levothyroxine (SYNTHROID, LEVOTHROID) 200 MCG tablet Take 200 mcg by mouth daily before breakfast.    . Multiple Vitamin (MULTIVITAMIN) tablet Take 1 tablet by mouth daily.    Marland Kitchen  PRADAXA 150 MG CAPS capsule TAKE 1 CAPSULE TWICE DAILY 180 capsule 2  . sertraline (ZOLOFT) 50 MG tablet Take 1 tablet (50 mg total) by mouth daily. 90 tablet 2  . lisinopril (PRINIVIL,ZESTRIL) 10 MG tablet TAKE 1 TABLET TWICE A DAY 180 tablet 3  . rosuvastatin (CRESTOR) 10 MG tablet Take 10 mg by mouth 2 (two) times a week.     No facility-administered medications  prior to visit.      Allergies:   Patient has no known allergies.   Social History   Social History  . Marital status: Married    Spouse name: N/A  . Number of children: N/A  . Years of education: N/A   Social History Main Topics  . Smoking status: Current Every Day Smoker    Packs/day: 0.50    Years: 51.00    Types: Cigarettes  . Smokeless tobacco: Never Used  . Alcohol use No     Comment: recovering alcoholic  . Drug use: No  . Sexual activity: Yes    Birth control/ protection: None   Other Topics Concern  . None   Social History Narrative  . None     Family History:  The patient's family history includes Alcohol abuse in his maternal grandfather and maternal uncle; Heart attack in his father; Heart disease in his father, mother, paternal aunt, and paternal uncle; Hyperlipidemia in his father; Hypertension in his father and mother.   ROS:   Please see the history of present illness.    ROS All other systems reviewed and are negative.   PHYSICAL EXAM:   VS:  BP 100/60 (BP Location: Right Arm, Patient Position: Sitting, Cuff Size: Large)   Pulse 74   Ht 6\' 1"  (3.875 m)   Wt (!) 149.7 kg (330 lb)   BMI 43.54 kg/m    GEN: Well nourished, well developed, in no acute distress. Morbid obesity limits the exam  HEENT: normal  Neck: no JVD, carotid bruits, or masses Cardiac: RRR, paradoxically split S2; no murmurs, rubs, or gallops,no edema ; healthy left subclavian ICD site Respiratory:  clear to auscultation bilaterally, normal work of breathing GI: soft, nontender, nondistended, + BS MS: no deformity or atrophy  Skin: warm and dry, no rash Neuro:  Alert and Oriented x 3, Strength and sensation are intact Psych: euthymic mood, full affect  Wt Readings from Last 3 Encounters:  03/15/17 (!) 149.7 kg (330 lb)  02/21/17 (!) 150.5 kg (331 lb 11.2 oz)  09/20/16 (!) 146.8 kg (323 lb 9.6 oz)      Studies/Labs Reviewed:   EKG:  EKG is ordered today.  The ekg  ordered today demonstrates atrial fibrillation, 100% V paced  Recent Labs: 09/20/2016: Hemoglobin 15.2 02/09/2017: ALT 13; BUN 9; Creat 1.04; Potassium 4.8; Sodium 141 02/21/2017: Platelets 247; TSH 2.730   Lipid Panel    Component Value Date/Time   CHOL 114 02/09/2017 1103   TRIG 127 02/09/2017 1103   HDL 32 (L) 02/09/2017 1103   CHOLHDL 3.6 02/09/2017 1103   VLDL 25 02/09/2017 1103   LDLCALC 57 02/09/2017 1103      ASSESSMENT:    1. CHB (complete heart block) (H[facility])   2. Permanent atrial fibrillation ([place])   3. Long term current use of anticoagulant   4. Biventricular ICD (implantable cardioverter-defibrillator) in place   5. Congestive dilated cardiomyopathy ([place])   6. Chronic diastolic heart failure ([place])   7. OSA on CPAP   8. Morbid obesity ([place])  9. Dyslipidemia (high LDL; low HDL)   10. Hypothyroidism, postradioiodine therapy   11. History of alcohol dependence ([place])       PLAN:  In order of problems listed above:  1. CHB: Status post AV node ablation, pacemaker dependent 2. AFib: Permanent arrhythmia, on appropriate anticoagulation.. 3. Pradaxa anticoagulation is well tolerated, without bleeding complications. CHADSvasc 2 (age, CHF). 4. CRT-D: normal device function with excellent biv pacing. Continue remote downloads every 3 months 5. CHF: Good functional status. Does not appear to be troubled by shortness of breath compared with his last appointment. Denies edema or other signs of hypervolemia. His device also shows very stable volume status and unchanged activity level. He has noticed that if he skips his diuretic he does developed ankle swelling. This could well be related to right heart failure from obesity and sleep apnea, rather than left ventricular failure. I think he is euvolemic, NYHA functional class I. Since his blood pressure is borderline low and he is having occasional dizziness will reduce the lisinopril to 10 mg once daily only. 6. OSA on CPAP:  Compliant with CPAP.  7. Obesity: Strongly recommend weight loss 8. Tobacco abuse: as before, he is not particularly interested in a discussion about smoking cessation. 9. Dyslipidemia (high LDL; low HDL): Time to reevaluate labs. 10. Hypothyroidism, postradioiodine therapy: Followed by his endocrinologist 11. Alcoholism in recovery: Remains abstinent   Medication Adjustments/Labs and Tests Ordered: Current medicines are reviewed at length with the patient today.  Concerns regarding medicines are outlined above.  Medication changes, Labs and Tests ordered today are listed in the Patient Instructions below. Patient Instructions  Medication Instructions: Your Lisinopril has decreased to 10 mg once a day.   Follow-Up: Remote monitoring is used to monitor your Pacemaker of ICD from home. This monitoring reduces the number of office visits required to check your device to one time per year. It allows [place] to keep an eye on the functioning of your device to ensure it is working properly. You are scheduled for a device check from home on Tuesday July 3rd. You may send your transmission at any time that day. If you have a wireless device, the transmission will be sent automatically. After your physician reviews your transmission, you will receive a postcard with your next transmission date.    Your physician wants you to follow-up in 6 months with Dr. Sallyanne Kuster. You will receive a reminder letter in the mail two months in advance. If you don't receive a letter, please call our office to schedule the follow-up appointment.     If you need a refill on your cardiac medications before your next appointment, please call your pharmacy.        Signed, Sanda Klein, MD  03/15/2017 12:55 PM    [address], [place], [place]  [address] Phone: [phone]; Fax: [phone]

## 2017-03-16 LAB — CUP PACEART INCLINIC DEVICE CHECK
Brady Statistic AP VP Percent: 0 %
Brady Statistic AP VS Percent: 0 %
Brady Statistic AS VP Percent: 99.28 %
Brady Statistic AS VS Percent: 0.72 %
Brady Statistic RV Percent Paced: 98.89 %
Date Time Interrogation Session: 20180403132659
HIGH POWER IMPEDANCE MEASURED VALUE: 44 Ohm
HighPow Impedance: 57 Ohm
Implantable Lead Implant Date: 20071217
Implantable Lead Implant Date: 20071217
Implantable Lead Location: 753858
Implantable Lead Location: 753859
Implantable Lead Location: 753860
Implantable Lead Model: 5076
Implantable Lead Model: 6947
Implantable Pulse Generator Implant Date: 20160524
Lead Channel Impedance Value: 304 Ohm
Lead Channel Impedance Value: 399 Ohm
Lead Channel Impedance Value: 779 Ohm
Lead Channel Pacing Threshold Amplitude: 0.75 V
Lead Channel Pacing Threshold Amplitude: 1 V
Lead Channel Pacing Threshold Pulse Width: 0.8 ms
Lead Channel Sensing Intrinsic Amplitude: 4.375 mV
Lead Channel Sensing Intrinsic Amplitude: 4.375 mV
Lead Channel Setting Pacing Pulse Width: 0.8 ms
Lead Channel Setting Sensing Sensitivity: 0.3 mV
MDC IDC LEAD IMPLANT DT: 20071217
MDC IDC MSMT BATTERY REMAINING LONGEVITY: 67 mo
MDC IDC MSMT BATTERY VOLTAGE: 3 V
MDC IDC MSMT LEADCHNL LV IMPEDANCE VALUE: 608 Ohm
MDC IDC MSMT LEADCHNL RV IMPEDANCE VALUE: 304 Ohm
MDC IDC MSMT LEADCHNL RV IMPEDANCE VALUE: 361 Ohm
MDC IDC MSMT LEADCHNL RV PACING THRESHOLD PULSEWIDTH: 0.4 ms
MDC IDC SET LEADCHNL LV PACING AMPLITUDE: 2 V
MDC IDC SET LEADCHNL RV PACING AMPLITUDE: 2.5 V
MDC IDC SET LEADCHNL RV PACING PULSEWIDTH: 0.4 ms
MDC IDC STAT BRADY RA PERCENT PACED: 0 %

## 2017-03-23 ENCOUNTER — Encounter: Payer: Medicare Other | Admitting: Cardiovascular Disease

## 2017-04-01 ENCOUNTER — Telehealth: Payer: Self-pay | Admitting: Family Medicine

## 2017-04-01 NOTE — Telephone Encounter (Signed)
Pt called to inquire about his C-pap machine, (2nd) submission of forms from the [employer] (they needed specific setting of the machine from original C-pap---Patient states he had them sent from the Dr's office that originally prescribed the machine & called to make sure we rcvd them / spk w/ Dorothea Ogle---- Advised Pt that we would review & contact him on Mon or Tues. --glh

## 2017-04-04 NOTE — Telephone Encounter (Signed)
LVM for pt informing him that [employer] form faxed on 03/22/17.  Advised pt to call [employer] for status update.  Pt was provided with [employer]'s phone number.  Charyl Bigger, CMA

## 2017-04-06 ENCOUNTER — Telehealth: Payer: Self-pay | Admitting: Family Medicine

## 2017-04-06 NOTE — Telephone Encounter (Signed)
Please call pt and tell him settings for CPAP need to come from the sleep doctor.  If he doesn't have one currently, he will need to be referred for a new sleep study so that we know what his "new" CPAP settings will be.

## 2017-04-06 NOTE — Telephone Encounter (Signed)
Pt called states [employer] his C-pap supply company states they now need [place] to call them regarding his New C-pap machine---- (settings) sent from [place] were from an old machine and were not usable for the new machine--- Please call [phone] to speak w/ Donna Bernard. Pt says they need an Rx written for the new machine & reason for replacement. --glh

## 2017-04-06 NOTE — Telephone Encounter (Signed)
Pt called states now C-pap replacement company.  [place], [place] [address] P# [phone] [id number] F# [phone]  Patient says they now state unsure what is needed that reading/ settings rcvd from [place] were from Old machine and are "Not " usable for the new machine.-- Patient request we contact [employer] directly to confirm what is needed so he can get the new C-pap machine.  --glh

## 2017-04-13 ENCOUNTER — Telehealth: Payer: Self-pay | Admitting: Family Medicine

## 2017-04-13 NOTE — Telephone Encounter (Signed)
LVM for pt to call to discuss.  T. Nelson, CMA  

## 2017-04-13 NOTE — Telephone Encounter (Signed)
Clld patient as requested regarding new C-pap machine advised him that Dr. Raliegh Scarlet said he would hv to have a new Sleep Study --- Pt states his old sleep study was done when they lived in [place], [place] -- pt ask that we refer him to clinic in [place] for the Sleep study--glh

## 2017-04-13 NOTE — Telephone Encounter (Signed)
Please call pt and see if he prefers pulm or cards and where- is [place] ok?  - Once he confirms, please place referral for OSA  -" DX: OSA "   Indications:  "needs new equipment and updated testing". - Dr Jenetta Downer

## 2017-04-16 ENCOUNTER — Other Ambulatory Visit: Payer: Self-pay | Admitting: Cardiology

## 2017-04-18 ENCOUNTER — Other Ambulatory Visit: Payer: Self-pay

## 2017-04-18 DIAGNOSIS — G4733 Obstructive sleep apnea (adult) (pediatric): Secondary | ICD-10-CM

## 2017-04-18 NOTE — Telephone Encounter (Signed)
Spoke with pt and he states that he prefers to go to his cardiologist.  Referral placed.  Charyl Bigger, CMA

## 2017-04-19 ENCOUNTER — Telehealth: Payer: Self-pay | Admitting: Cardiovascular Disease

## 2017-04-19 DIAGNOSIS — Z9989 Dependence on other enabling machines and devices: Principal | ICD-10-CM

## 2017-04-19 DIAGNOSIS — G4733 Obstructive sleep apnea (adult) (pediatric): Secondary | ICD-10-CM

## 2017-04-19 NOTE — Telephone Encounter (Signed)
Routed for confirmatory OK to order test.

## 2017-04-19 NOTE — Telephone Encounter (Signed)
Pt's primary doctor Dr Mellody Dance, was referring him back to Dr C for a sleep study. Pt just saw Dr C on 03-15-17, all he needs is for Dr C to order a sleep study or for him to refer him to Dr Claiborne Billings.Pt wants Dr C to know that he had to go back taking 2 Lisinopril a day,the one was not working,

## 2017-04-20 NOTE — Telephone Encounter (Signed)
Spoke to patient, have made him aware Dr. Sallyanne Kuster is happy to order this test for him & that someone will contact him to schedule this test after order is placed. He expressed understanding and thanks, and is aware to call if further inquiries.

## 2017-04-20 NOTE — Telephone Encounter (Signed)
OK to order sleep test. Please make reported change of lisinopril dose in med list

## 2017-05-10 ENCOUNTER — Ambulatory Visit (INDEPENDENT_AMBULATORY_CARE_PROVIDER_SITE_OTHER): Payer: Medicare Other | Admitting: Cardiovascular Disease

## 2017-05-10 ENCOUNTER — Encounter: Payer: Self-pay | Admitting: Cardiovascular Disease

## 2017-05-10 VITALS — BP 120/80 | HR 75 | Ht 73.0 in | Wt 330.0 lb

## 2017-05-10 DIAGNOSIS — I482 Chronic atrial fibrillation: Secondary | ICD-10-CM

## 2017-05-10 DIAGNOSIS — I420 Dilated cardiomyopathy: Secondary | ICD-10-CM

## 2017-05-10 DIAGNOSIS — G4733 Obstructive sleep apnea (adult) (pediatric): Secondary | ICD-10-CM | POA: Diagnosis not present

## 2017-05-10 DIAGNOSIS — Z7901 Long term (current) use of anticoagulants: Secondary | ICD-10-CM | POA: Diagnosis not present

## 2017-05-10 DIAGNOSIS — Z95810 Presence of automatic (implantable) cardiac defibrillator: Secondary | ICD-10-CM | POA: Diagnosis not present

## 2017-05-10 DIAGNOSIS — I4821 Permanent atrial fibrillation: Secondary | ICD-10-CM

## 2017-05-10 NOTE — Progress Notes (Signed)
Cardiology Office Note    Date:  05/12/2017   ID:  Brian Craig, DOB Oct 18, 1951, MRN [id number]  PCP:  Mellody Dance, DO  Cardiologist:  Shelva Majestic, MD (sleep); Dr. Sallyanne Kuster  Chief Complaint  Patient presents with  . Follow-up    New patient. Dr. Raliegh Scarlet referral for sleep apnea.  . Shortness of Breath  . Edema    Swelling    History of Present Illness:  Brian Craig is a 66 y.o. male is followed by Dr. Sallyanne Kuster for cardiology disease.  He has a history of severe sleep apnea.  He is now referred by Dr. Everette Rank and Croitoru for reassessment of his sleep apnea and for a new machine.  Brian Craig has a history of prior severe cardiomyopathy related to uncontrolled atrial fibrillation with rapid ventricular response that started in the setting of thyrotoxicosis.  His left ventricle ejection fraction had been as low as 17%.  He on underwent AV node ablation and received a biventricular pacemaker/defibrillator in 2007.  In 2007.  Nocturia was also evaluated in [place], [place] for sleep apnea, which was felt to be severe.  Although I do not have these records, I suspect he may of had significant central apneic events in the setting of his LV dysfunction such that since 2007.  He has been using a ResMed VPAP adapt SV unit.  He admits to 100% compliance and cannot sleep without it.  Recently, his 66 year old adapt servoventilation unit has begun to malfunction.  He has wire fragments and has been working intermittently.  He uses a fullface mask.  He typically goes to bed at midnight and wakes up at 8 AM.  Previously, he had awakened frequently with nocturia, but with treatment.  He only wakes up 1 time per night for urination.  Since 2007, he admits to a 30 pound weight gain.  His LV function has improved and reportedly had increased to a proximally 50%, but he has not had a recent echo.  With his recent machine malfunction, he now presents for sleep evaluation in order to obtain a new unit  for treatment of his obstructive sleep apnea.  He has been under the [facility] which is is in [place].  He is not established with the DME company locally despite living in [place]  since 2015.  Typically he is in bed by midnight and wakes up at 8 AM.  He admits to 100% compliance.  An Epworth scale was calculated in the office today as shown below:  Epworth Sleepiness Scale: Situation   Chance of Dozing/Sleeping (0 = never , 1 = slight chance , 2 = moderate chance , 3 = high chance )   sitting and reading 0   watching TV 1   sitting inactive in a public place 0   being a passenger in a motor vehicle for an hour or more 0   lying down in the afternoon 3   sitting and talking to someone 0   sitting quietly after lunch (no alcohol) 0   while stopped for a few minutes in traffic as the driver 0   Total Score  4     Past Medical History:  Diagnosis Date  . Atrial fibrillation ([place])   . CHF (congestive heart failure) ([place])   . Graves disease    Graves  . Hyperlipidemia   . Hypertension   . OSA treated with BiPAP   . Substance abuse    alcohol    Past  Surgical History:  Procedure Laterality Date  . APPENDECTOMY    . BIV ICD GENERTAOR CHANGE OUT Left 2012   Medtronic Protecta  . CARDIAC ELECTROPHYSIOLOGY STUDY AND ABLATION    . EP IMPLANTABLE DEVICE N/A 05/06/2015   Procedure: ICD Generator Changeout;  Surgeon: Sanda Klein, MD;  Location: [facility];  Service: Cardiovascular;  Laterality: N/A;  . HERNIA REPAIR    . MENISCUS REPAIR Right   . VASECTOMY     unilateral due to injury    Current Medications: Outpatient Medications Prior to Visit  Medication Sig Dispense Refill  . acetaminophen (TYLENOL) 325 MG tablet Take 650 mg by mouth every 6 (six) hours as needed for headache.    . ALPRAZolam (XANAX) 0.5 MG tablet Take 1 tablet (0.5 mg total) by mouth 3 (three) times daily as needed for anxiety. 30 tablet 0  . carvedilol (COREG CR) 80 MG 24 hr  capsule TAKE 1 CAPSULE DAILY 90 capsule 3  . Cholecalciferol (CVS D3) 2000 units CAPS Take 2,000 Units by mouth daily.    . furosemide (LASIX) 40 MG tablet TAKE 1 TABLET DAILY 90 tablet 3  . KLOR-CON 10 10 MEQ tablet TAKE 1 TABLET DAILY 90 tablet 3  . levothyroxine (SYNTHROID, LEVOTHROID) 200 MCG tablet Take 200 mcg by mouth daily before breakfast.    . lisinopril (PRINIVIL,ZESTRIL) 10 MG tablet Take 1 tablet (10 mg total) by mouth daily. (Patient taking differently: Take 10 mg by mouth 2 (two) times daily. ) 180 tablet 3  . Multiple Vitamin (MULTIVITAMIN) tablet Take 1 tablet by mouth daily.    Marland Kitchen PRADAXA 150 MG CAPS capsule TAKE 1 CAPSULE TWICE DAILY 180 capsule 3  . sertraline (ZOLOFT) 50 MG tablet Take 1 tablet (50 mg total) by mouth daily. 90 tablet 2  . simvastatin (ZOCOR) 40 MG tablet Take 40 mg by mouth daily.     No facility-administered medications prior to visit.      Allergies:   Patient has no known allergies.   Social History   Social History  . Marital status: Married    Spouse name: N/A  . Number of children: N/A  . Years of education: N/A   Social History Main Topics  . Smoking status: Current Every Day Smoker    Packs/day: 0.50    Years: 51.00    Types: Cigarettes  . Smokeless tobacco: Never Used  . Alcohol use No     Comment: recovering alcoholic  . Drug use: No  . Sexual activity: Yes    Birth control/ protection: None   Other Topics Concern  . None   Social History Narrative  . None     Family History:  The patient's family history includes Alcohol abuse in his maternal grandfather and maternal uncle; Heart attack in his father; Heart disease in his father, mother, paternal aunt, and paternal uncle; Hyperlipidemia in his father; Hypertension in his father and mother.   ROS General: Negative; No fevers, chills, or night sweats;  HEENT: Negative; No changes in vision or hearing, sinus congestion, difficulty swallowing Pulmonary: Negative; No cough,  wheezing, shortness of breath, hemoptysis Cardiovascular: Negative; No chest pain, presyncope, syncope, palpitations; history of PAF with remote severe LV dysfunction, which did improve following ablation, and bi pacing GI: Negative; No nausea, vomiting, diarrhea, or abdominal pain GU: Negative; No dysuria, hematuria, or difficulty voiding Musculoskeletal: Negative; no myalgias, joint pain, or weakness Hematologic/Oncology: Negative; no easy bruising, bleeding Endocrine: Negative; no heat/cold intolerance; no diabetes Neuro:  Negative; no changes in balance, headaches Skin: Negative; No rashes or skin lesions Psychiatric: Negative; No behavioral problems, depression Sleep: On VPAP adapt several ventilation since 2007; admits to 100% compliance.  No residual snoring or daytime sleepiness, hypersomnolence, bruxism, restless legs, hypnogognic hallucinations, no cataplexy Other comprehensive 14 point system review is negative.   PHYSICAL EXAM:   VS:  BP 120/80   Pulse 75   Ht '6\' 1"'$  (1.854 m)   Wt (!) 330 lb (149.7 kg)   BMI 43.54 kg/m     Repeat blood pressure by me 120/82  Wt Readings from Last 3 Encounters:  05/10/17 (!) 330 lb (149.7 kg)  03/15/17 (!) 330 lb (149.7 kg)  02/21/17 (!) 331 lb 11.2 oz (150.5 kg)    General: Alert, oriented, no distress.  Skin: normal turgor, no rashes, warm and dry HEENT: Normocephalic, atraumatic. Pupils equal round and reactive to light; sclera anicteric; extraocular muscles intact; Fundi ** Nose without nasal septal hypertrophy Mouth/Parynx benign; Mallinpatti scale Neck: No JVD, no carotid bruits; normal carotid upstroke Lungs: clear to ausculatation and percussion; no wheezing or rales Chest wall: without tenderness to palpitation Heart: PMI not displaced, RRR, s1 s2 normal, 1/6 systolic murmur, no diastolic murmur, no rubs, gallops, thrills, or heaves Abdomen: soft, nontender; no hepatosplenomehaly, BS+; abdominal aorta nontender and not dilated  by palpation. Back: no CVA tenderness Pulses 2+ Musculoskeletal: full range of motion, normal strength, no joint deformities Extremities: no clubbing cyanosis or edema, Homan's sign negative  Neurologic: grossly nonfocal; Cranial nerves grossly wnl Psychologic: Normal mood and affect   Studies/Labs Reviewed:   EKG:  EKG is ordered today.  ECG (independently read by me): Ventricular paced rhythm with biventricular pacing.  Ventricular rate 75 bpm.  Recent Labs: BMP Latest Ref Rng & Units 02/09/2017 09/20/2016 04/30/2015  Glucose 65 - 99 mg/dL 106(H) 99 102(H)  BUN 7 - 25 mg/dL '9 11 8  '$ Creatinine 0.70 - 1.25 mg/dL 1.04 1.06 0.69  Sodium 135 - 146 mmol/L 141 139 139  Potassium 3.5 - 5.3 mmol/L 4.8 4.6 4.3  Chloride 98 - 110 mmol/L 101 102 103  CO2 20 - 31 mmol/L '30 28 27  '$ Calcium 8.6 - 10.3 mg/dL 9.3 9.3 9.3     Hepatic Function Latest Ref Rng & Units 02/09/2017 09/20/2016 04/30/2015  Total Protein 6.1 - 8.1 g/dL 6.6 6.7 6.7  Albumin 3.6 - 5.1 g/dL 4.0 4.0 4.0  AST 10 - 35 U/L '15 16 15  '$ ALT 9 - 46 U/L '13 15 15  '$ Alk Phosphatase 40 - 115 U/L 66 68 63  Total Bilirubin 0.2 - 1.2 mg/dL 0.6 0.7 0.7    CBC Latest Ref Rng & Units 02/21/2017 09/20/2016 04/30/2015  WBC 3.4 - 10.8 x10E3/uL 8.6 9.8 8.2  Hemoglobin 13.2 - 17.1 g/dL - 15.2 15.1  Hematocrit 37.5 - 51.0 % 43.4 45.6 43.3  Platelets 150 - 379 x10E3/uL 247 255 230   Lab Results  Component Value Date   MCV 96 02/21/2017   MCV 96.0 09/20/2016   MCV 93.1 04/30/2015   Lab Results  Component Value Date   TSH 2.730 02/21/2017   Lab Results  Component Value Date   HGBA1C 5.5 02/21/2017     BNP No results found for: BNP  ProBNP No results found for: PROBNP   Lipid Panel     Component Value Date/Time   CHOL 114 02/09/2017 1103   TRIG 127 02/09/2017 1103   HDL 32 (L) 02/09/2017 1103   CHOLHDL  3.6 02/09/2017 1103   VLDL 25 02/09/2017 1103   LDLCALC 57 02/09/2017 1103     RADIOLOGY: No results found.   Additional  studies/ records that were reviewed today include:  I reviewed the patient's records from Dr. Sallyanne Kuster and Dr. Darrall Dears.  I will try to obtain records of his initial sleep study done in [place], [place].   ASSESSMENT:    1. Obstructive sleep apnea   2. Permanent atrial fibrillation ([place])   3. Biventricular ICD (implantable cardioverter-defibrillator) in place   4. Congestive dilated cardiomyopathy ([place])   5. Long term current use of anticoagulant      PLAN:  Brian Craig is a 66 year old gentleman who has a history of morbid obesity and previous weight documented severe cardiomyopathy with an EF as low as 17% in 2007.  At that time.  He also had undergone a sleep study and was told of having very severe sleep apnea.  I suspect he may have had complex sleep apnea with central events and even possibly Cheyne-Stokes respirations for which he was started on adapt Servo ventilation therapy.  I will try to obtain records from [place] for verification.  However, he subsequently has undergone ablation as well as insertion of a biventricular pacemaker and this has resulted in almost normalization of LV function.  As result, he may no longer require adapt Servo ventilation mode of therapy.  If he is not having any central events.  Since it is been over 11 years since his initial evaluation and he has gained weight, rather than immediately prescribing the same treatment.  I have recommended he undergo a subsequent sleep study and have instructed them to initiate this as a split-night protocol and then initiate CPAP, BiPAP and if needed.  ASV.  He may only require BiPAP therapy, particularly if he is not having significant central events.  I also recommended a reassessment of his LV function since this will be very important to know if in fact.  ASV is potentially recommended since at present ASV is technically contraindicated with reduced LV function.  However, further studies are underway for  reassessment of this recent guideline change.  We have contacted the sleep lab.  Fortunately, there was a cancellation for this week and his repeat study will be done on 05/12/2017.  I will review this study when available and will then prescribe a new teen, depending upon his current assessment.  We discussed the importance of weight loss with his BMI of 43.54.  Following reinitiation of therapy I will see him in a sleep clinic for further evaluation.  Medication Adjustments/Labs and Tests Ordered: Current medicines are reviewed at length with the patient today.  Concerns regarding medicines are outlined above.  Medication changes, Labs and Tests ordered today are listed in the Patient Instructions below. Patient Instructions  Your physician has recommended that you have a sleep study. This test records several body functions during sleep, including: brain activity, eye movement, oxygen and carbon dioxide blood levels, heart rate and rhythm, breathing rate and rhythm, the flow of air through your mouth and nose, snoring, body muscle movements, and chest and belly movement.  Please report to [facility] @ 8:00 pm. [address] ( [place])   Your physician recommends that you schedule a follow-up appointment in: 2 months.     Signed, Shelva Majestic, MD  05/12/2017 2:57 PM    [facility] [address],  [address], [place], [place]  [address] Phone: [phone]

## 2017-05-10 NOTE — Patient Instructions (Signed)
Your physician has recommended that you have a sleep study. This test records several body functions during sleep, including: brain activity, eye movement, oxygen and carbon dioxide blood levels, heart rate and rhythm, breathing rate and rhythm, the flow of air through your mouth and nose, snoring, body muscle movements, and chest and belly movement.  Please report to [facility] @ 8:00 pm. [address] ( [place])   Your physician recommends that you schedule a follow-up appointment in: 2 months.

## 2017-05-11 ENCOUNTER — Other Ambulatory Visit: Payer: Self-pay | Admitting: *Deleted

## 2017-05-11 DIAGNOSIS — Z9989 Dependence on other enabling machines and devices: Principal | ICD-10-CM

## 2017-05-11 DIAGNOSIS — E039 Hypothyroidism, unspecified: Secondary | ICD-10-CM | POA: Diagnosis not present

## 2017-05-11 DIAGNOSIS — G4733 Obstructive sleep apnea (adult) (pediatric): Secondary | ICD-10-CM

## 2017-05-12 ENCOUNTER — Encounter (HOSPITAL_BASED_OUTPATIENT_CLINIC_OR_DEPARTMENT_OTHER): Payer: Medicare Other

## 2017-05-12 ENCOUNTER — Ambulatory Visit (HOSPITAL_BASED_OUTPATIENT_CLINIC_OR_DEPARTMENT_OTHER): Payer: Medicare Other | Attending: Cardiovascular Disease | Admitting: Cardiovascular Disease

## 2017-05-12 ENCOUNTER — Ambulatory Visit: Payer: Medicare Other | Admitting: Cardiovascular Disease

## 2017-05-12 VITALS — Ht 73.0 in | Wt 332.0 lb

## 2017-05-12 DIAGNOSIS — Z9989 Dependence on other enabling machines and devices: Secondary | ICD-10-CM | POA: Diagnosis not present

## 2017-05-12 DIAGNOSIS — G4733 Obstructive sleep apnea (adult) (pediatric): Secondary | ICD-10-CM | POA: Diagnosis not present

## 2017-05-24 ENCOUNTER — Telehealth: Payer: Self-pay | Admitting: Cardiovascular Disease

## 2017-05-24 NOTE — Telephone Encounter (Signed)
Pt would like his sleep study results please.

## 2017-06-01 ENCOUNTER — Encounter: Payer: Self-pay | Admitting: Adult Health

## 2017-06-01 ENCOUNTER — Ambulatory Visit (INDEPENDENT_AMBULATORY_CARE_PROVIDER_SITE_OTHER): Payer: Medicare Other | Admitting: Adult Health

## 2017-06-01 DIAGNOSIS — R221 Localized swelling, mass and lump, neck: Secondary | ICD-10-CM | POA: Insufficient documentation

## 2017-06-01 MED ORDER — AMOXICILLIN-POT CLAVULANATE 875-125 MG PO TABS: 1.0000 | ORAL_TABLET | Freq: Two times a day (BID) | ORAL | 0 refills | Status: DC

## 2017-06-01 NOTE — Patient Instructions (Signed)
Amoxicillin; Clavulanic Acid chewable tablets What is this medicine? AMOXICILLIN; CLAVULANIC ACID (a mox i SIL in; KLAV yoo lan ic AS id) is a penicillin antibiotic. It is used to treat certain kinds of bacterial infections. It It will not work for colds, flu, or other viral infections. This medicine may be used for other purposes; ask your health care provider or pharmacist if you have questions. COMMON BRAND NAME(S): Augmentin What should I tell my health care provider before I take this medicine? They need to know if you have any of these conditions: -bowel disease, like colitis -kidney disease -liver disease -mononucleosis -phenylketonuria -an unusual or allergic reaction to amoxicillin, penicillin, cephalosporin, other antibiotics, clavulanic acid, other medicines, foods, dyes, or preservatives -pregnant or trying to get pregnant -breast-feeding How should I use this medicine? Take this medicine by mouth. Chew it completely before swallowing. Follow the directions on the prescription label. Take this medicine at the start of a meal or snack. Take your medicine at regular intervals. Do not take your medicine more often than directed. Take all of your medicine as directed even if you think you are better. Do not skip doses or stop your medicine early. Talk to your pediatrician regarding the use of this medicine in children. While this drug may be prescribed for selected conditions, precautions do apply. Overdosage: If you think you have taken too much of this medicine contact a poison control center or emergency room at once. NOTE: This medicine is only for you. Do not share this medicine with others. What if I miss a dose? If you miss a dose, take it as soon as you can. If it is almost time for your next dose, take only that dose. Do not take double or extra doses. What may interact with this medicine? -allopurinol -anticoagulants -birth control pills -methotrexate -probenecid This  list may not describe all possible interactions. Give your health care provider a list of all the medicines, herbs, non-prescription drugs, or dietary supplements you use. Also tell them if you smoke, drink alcohol, or use illegal drugs. Some items may interact with your medicine. What should I watch for while using this medicine? Tell your doctor or health care professional if your symptoms do not improve. Do not treat diarrhea with over the counter products. Contact your doctor if you have diarrhea that lasts more than 2 days or if it is severe and watery. If you have diabetes, you may get a false-positive result for sugar in your urine. Check with your doctor or health care professional. Birth control pills may not work properly while you are taking this medicine. Talk to your doctor about using an extra method of birth control. What side effects may I notice from receiving this medicine? Side effects that you should report to your doctor or health care professional as soon as possible: -allergic reactions like skin rash, itching or hives, swelling of the face, lips, or tongue -breathing problems -dark urine -fever or chills, sore throat -redness, blistering, peeling or loosening of the skin, including inside the mouth -seizures -trouble passing urine or change in the amount of urine -unusual bleeding, bruising -unusually weak or tired -white patches or sores in the mouth or throat Side effects that usually do not require medical attention (report to your doctor or health care professional if they continue or are bothersome): -diarrhea -dizziness -headache -nausea, vomiting -stomach upset -vaginal or anal irritation This list may not describe all possible side effects. Call your doctor for medical advice  about side effects. You may report side effects to FDA at 1-800-FDA-1088. Where should I keep my medicine? Keep out of the reach of children. Store at room temperature below 25 degrees  C (77 degrees F). Keep container tightly closed. Throw away any unused medicine after the expiration date. NOTE: This sheet is a summary. It may not cover all possible information. If you have questions about this medicine, talk to your doctor, pharmacist, or health care provider.  2018 Elsevier/Gold Standard (2008-02-22 11:38:22)  Take Augmentin twice daily for 10 days.  Take with food. If symptoms persist after Augmentin completed, then please call clinic. Try not scratch/touch area on neck.

## 2017-06-01 NOTE — Progress Notes (Signed)
Subjective:    Patient ID: Brian Craig, male    DOB: 1951/08/27, 66 y.o.   MRN: [id number]  HPI:  Brian Craig is here for "lump on the R side of his neck" that developed > 1 week ago and has not improved at all.  He reports mild tenderness with palpation.  He denies recent camping/being outside for extended period of time or having been bitten by a tick.   He denies fever/night sweats/poor appetite/fatigue/drainage from site. 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  He developed "infected lump" on R cheek that was treated with needle bx and abx.  Patient Care Team    Relationship Specialty Notifications Start End  Mellody Dance, DO PCP - General Family Medicine  03/15/17   Brendolyn Patty, MD  Dermatology  02/21/17   Warden Fillers, MD Consulting Physician Ophthalmology  02/21/17   Amalia Greenhouse, MD Referring Physician Endocrinology  02/21/17   Sanda Klein, MD Consulting Physician Cardiology  02/21/17     Patient Active Problem List   Diagnosis Date Noted  . Localized swelling, mass or lump of neck 06/01/2017  . Vitamin D deficiency 03/04/2017  . Long term current use of anticoagulant 09/20/2016  . Chronic diastolic heart failure ([place]) 02/02/2016  . Hypothyroidism 02/02/2016  . History of alcohol dependence ([place]) 02/02/2016  . ICD (implantable cardioverter-defibrillator) battery depletion 04/17/2015  . Congestive dilated cardiomyopathy ([place]) 07/15/2014  . Chronic combined systolic and diastolic CHF, NYHA class 1 ([place]) 07/15/2014  . OSA on CPAP 07/15/2014  . Morbid obesity ([place]) 07/15/2014  . Permanent atrial fibrillation ([place]) 07/15/2014  . CHB (complete heart block) ([place]) 07/15/2014  . Biventricular ICD  implanted 2007, generator change in 2012 07/15/2014  . Tobacco abuse 07/15/2014  . Alcoholism in recovery ([place]) 07/15/2014  . Hypothyroidism, postradioiodine therapy 07/15/2014  . Dyslipidemia (high LDL; low HDL) 07/15/2014      Past Medical History:  Diagnosis Date  . Atrial fibrillation ([place])   . CHF (congestive heart failure) ([place])   . Graves disease    Graves  . Hyperlipidemia   . Hypertension   . OSA treated with BiPAP   . Substance abuse    alcohol     Past Surgical History:  Procedure Laterality Date  . APPENDECTOMY    . BIV ICD GENERTAOR CHANGE OUT Left 2012   Medtronic Protecta  . CARDIAC ELECTROPHYSIOLOGY STUDY AND ABLATION    . EP IMPLANTABLE DEVICE N/A 05/06/2015   Procedure: ICD Generator Changeout;  Surgeon: Sanda Klein, MD;  Location: [facility];  Service: Cardiovascular;  Laterality: N/A;  . HERNIA REPAIR    . MENISCUS REPAIR Right   . VASECTOMY     unilateral due to injury     Family History  Problem Relation Age of Onset  . Heart disease Mother   . Hypertension Mother   . Heart disease Father   . Hyperlipidemia Father   . Hypertension Father   . Heart attack Father   . Heart disease Paternal Aunt   . Heart disease Paternal Uncle   . Alcohol abuse Maternal Uncle   . Alcohol abuse Maternal Grandfather      History  Drug Use No     History  Alcohol Use No    Comment: recovering alcoholic     History  Smoking Status  . Current Every Day Smoker  . Packs/day: 0.50  . Years: 51.00  . Types: Cigarettes  Smokeless  Tobacco  . Never Used     Outpatient Encounter Prescriptions as of 06/01/2017  Medication Sig  . acetaminophen (TYLENOL) 325 MG tablet Take 650 mg by mouth every 6 (six) hours as needed for headache.  . ALPRAZolam (XANAX) 0.5 MG tablet Take 1 tablet (0.5 mg total) by mouth 3 (three) times daily as needed for anxiety.  . carvedilol (COREG CR) 80 MG 24 hr capsule TAKE 1 CAPSULE DAILY  . Cholecalciferol (CVS D3) 2000 units CAPS Take 2,000 Units by mouth daily.  . furosemide (LASIX) 40 MG tablet TAKE 1 TABLET DAILY  . KLOR-CON 10 10 MEQ tablet TAKE 1 TABLET DAILY  . levothyroxine (SYNTHROID, LEVOTHROID) 200 MCG tablet Take 200 mcg by  mouth daily before breakfast.  . lisinopril (PRINIVIL,ZESTRIL) 10 MG tablet Take 1 tablet (10 mg total) by mouth daily. (Patient taking differently: Take 10 mg by mouth 2 (two) times daily. )  . Multiple Vitamin (MULTIVITAMIN) tablet Take 1 tablet by mouth daily.  Marland Kitchen PRADAXA 150 MG CAPS capsule TAKE 1 CAPSULE TWICE DAILY  . sertraline (ZOLOFT) 50 MG tablet Take 1 tablet (50 mg total) by mouth daily.  . simvastatin (ZOCOR) 40 MG tablet Take 40 mg by mouth daily.  Marland Kitchen amoxicillin-clavulanate (AUGMENTIN) 875-125 MG tablet Take 1 tablet by mouth 2 (two) times daily.   No facility-administered encounter medications on file as of 06/01/2017.     Allergies: Patient has no known allergies.  Body mass index is 44.59 kg/m.  Blood pressure 97/64, pulse 72, temperature 98.3 F (36.8 C), height 6\' 1"  (1.854 m), weight (!) 338 lb (153.3 kg).     Review of Systems  Constitutional: Positive for fatigue. Negative for activity change, appetite change, chills, diaphoresis, fever and unexpected weight change.  Eyes: Negative for visual disturbance.  Respiratory: Negative for cough, chest tightness, shortness of breath, wheezing and stridor.   Cardiovascular: Negative for chest pain, palpitations and leg swelling.  Gastrointestinal: Negative for abdominal distention, constipation, diarrhea, nausea and vomiting.  Endocrine: Negative for cold intolerance, heat intolerance, polydipsia, polyphagia and polyuria.  Genitourinary: Negative for difficulty urinating and flank pain.  Skin: Negative for color change, pallor, rash and wound.       Lump on R neck that developed > 1 week ago.       Objective:   Physical Exam  Constitutional: He appears well-developed and well-nourished. No distress.  HENT:  Head: Normocephalic and atraumatic.  Right Ear: Hearing, tympanic membrane, external ear and ear canal normal. No drainage, swelling or tenderness. Tympanic membrane is not erythematous and not bulging. No  decreased hearing is noted.  Left Ear: Hearing, tympanic membrane, external ear and ear canal normal. No drainage, swelling or tenderness. Tympanic membrane is not erythematous and not bulging. No decreased hearing is noted.  Nose: Nose normal. Right sinus exhibits no maxillary sinus tenderness and no frontal sinus tenderness. Left sinus exhibits no maxillary sinus tenderness and no frontal sinus tenderness.  Mouth/Throat: Uvula is midline.  Neck: Normal range of motion. Neck supple.    One discrete, mobile, soft cyst that measures 1.2cm in diameter. No open tissue/drainage/excessive warmth/streaking noted. One swollen lymph node noted below cyst.  Lymphadenopathy:    He has cervical adenopathy.  Skin: Skin is warm, dry and intact. No rash noted. He is not diaphoretic. No erythema. No pallor.  One discrete, mobile, soft cyst that measures 1.2cm in diameter. No open tissue/drainage/excessive warmth/streaking noted.          Assessment & Plan:  1. Localized swelling, mass or lump of neck     Localized swelling, mass or lump of neck Present for > 1 week and not improving. Going to beach soon, so Doxy not appropriate for tx. Augmentin BID x 10 days. If sx's persist after ABX completed, then please call clinic.    FOLLOW-UP:  Return if symptoms worsen or fail to improve.

## 2017-06-01 NOTE — Assessment & Plan Note (Signed)
Present for > 1 week and not improving. Going to beach soon, so Doxy not appropriate for tx. Augmentin BID x 10 days. If sx's persist after ABX completed, then please call clinic.

## 2017-06-02 NOTE — Telephone Encounter (Signed)
Pt calling,would like his slleep study results.

## 2017-06-02 NOTE — Telephone Encounter (Signed)
Spoke with pt, aware can not see the results in the chart. Will check with dr Claiborne Billings tomorrow about results.

## 2017-06-03 NOTE — Telephone Encounter (Signed)
Brian Sine, MD  Mellody Dance, DO; Croitoru, Dani Gobble, MD; Lauralee Evener, CMA        [name], please set up for titration study. The patient did not tolerate CPAP in the past and had been on ASV since 2007. He probably will need BiPAP titration with possible transition to ASV    Left message for patient, will work on this next week and let him know.

## 2017-06-03 NOTE — Procedures (Signed)
Patient Name: Brian Craig, [place] Date: 05/12/2017 Gender: Male D.O.B: 12/30/1950 Age (years): 59 Referring Provider: Shelva Majestic MD, ABSM Height (inches): 73 Interpreting Physician: Shelva Majestic MD, ABSM Weight (lbs): 332 RPSGT: Laren Everts BMI: 50 MRN: [id number] Neck Size: 19.50  CLINICAL INFORMATION Sleep Study Type: NPSG  Indication for sleep study: Congestive Heart Failure, Obesity, OSA, Re-Evaluation, Snoring.  The patient has a history of complex severe sleep apnea and has been on therapy since 2007 with [employer].  He initially had a significant cardiomyopathy, is status post ablation, has an ICD in place, and LV function has improved. He is in need for new machine.  He was referred for follow-up evaluation with need for probable BiPAP and possible ASV.  Epworth Sleepiness Score: 3  SLEEP STUDY TECHNIQUE As per the AASM Manual for the Scoring of Sleep and Associated Events v2.3 (April 2016) with a hypopnea requiring 4% desaturations.  The channels recorded and monitored were frontal, central and occipital EEG, electrooculogram (EOG), submentalis EMG (chin), nasal and oral airflow, thoracic and abdominal wall motion, anterior tibialis EMG, snore microphone, electrocardiogram, and pulse oximetry.  MEDICATIONS acetaminophen (TYLENOL) 325 MG tablet ALPRAZolam (XANAX) 0.5 MG tablet amoxicillin-clavulanate (AUGMENTIN) 875-125 MG tablet carvedilol (COREG CR) 80 MG 24 hr capsule Cholecalciferol (CVS D3) 2000 units CAPS furosemide (LASIX) 40 MG tablet KLOR-CON 10 10 MEQ tablet levothyroxine (SYNTHROID, LEVOTHROID) 200 MCG tablet lisinopril (PRINIVIL,ZESTRIL) 10 MG tablet Multiple Vitamin (MULTIVITAMIN) tablet PRADAXA 150 MG CAPS capsule sertraline (ZOLOFT) 50 MG tablet simvastatin (ZOCOR) 40 MG tablet  Medications self-administered by patient taken the night of the study : TYLENOL PM, MELATONIN  SLEEP ARCHITECTURE The study was initiated at  10:00:25 PM and ended at 4:34:23 AM.  Sleep onset time was 39.6 minutes and the sleep efficiency was 49.4%. The total sleep time was 194.5 minutes.  Stage REM latency was 341.0 minutes.  The patient spent 40.36% of the night in stage N1 sleep, 55.53% in stage N2 sleep, 0.00% in stage N3 and 4.11% in REM.  Alpha intrusion was absent.  Supine sleep was 53.73%.  RESPIRATORY PARAMETERS The overall apnea/hypopnea index (AHI) was 57.7 per hour. Tthe respiratory disturbance index (RDI) was 71.9 per hour. There were 82 total apneas, including 73 obstructive, 9 central and 0 mixed apneas. There were 105 hypopneas and 46 RERAs.  The AHI during Stage REM sleep was 52.5 per hour.  AHI while supine was 71.2 per hour.  The mean oxygen saturation was 93.25%. The minimum SpO2 during sleep was 88.00%.  Moderate snoring was noted during this study.  CARDIAC DATA The 2 lead EKG demonstrated pacemaker generated. The mean heart rate was 70.12 beats per minute. Other EKG findings include: PVCs.  LEG MOVEMENT DATA The total PLMS were 0 with a resulting PLMS index of 0.00. Associated arousal with leg movement index was 0.0 .  IMPRESSIONS - Severe obstructive sleep apnea with an AHI of 57.7/h, RDI of 71.9/h). Events were worse with supine posture with an AHI of 71.2/h.  - Reduced sleep efficiency at 49.4%.  As result, the patient did not meet split-night criteria and a PSG was only performed - No significant central sleep apnea occurred during this study (CAI = 2.8/h). - abnormal sleep architecture with absence of slow wave sleep, reduction in REM sleep and prolonged latency to REM sleep development. - Mild oxygen desaturation to a nadir of 88%. - The patient snored with Moderate snoring volume. - EKG findings include paced rhythm and PVCs. - Clinically  significant periodic limb movements did not occur during sleep. No significant associated arousals.  DIAGNOSIS - Obstructive Sleep Apnea (327.23  [G47.33 ICD-10])  RECOMMENDATIONS - In this patient who has been on ASV therapy since 2007, and has not tolerated CPAP,  recommend BiPAP titration with possible ASV transition if significant central events are detected. - Efforts should be made to optimize nasal and oral pharyngeal patency. - The patient should be counseled to avoid supine sleep and consider positional therapy to avoid supine position during sleep. - Avoid alcohol, sedatives and other CNS depressants that may worsen sleep apnea and disrupt normal sleep architecture. - Sleep hygiene should be reviewed to assess factors that may improve sleep quality. - Weight management (BMI 44) and regular exercise should be initiated or continued if appropriate.  [Electronically signed] 06/03/2017 01:38 PM  Shelva Majestic MD, [facility], [place], American Board of Sleep Medicine   NPI: [id number] [place] PH: [phone]   FX: [phone] [place]

## 2017-06-06 ENCOUNTER — Telehealth: Payer: Self-pay | Admitting: Family Medicine

## 2017-06-06 DIAGNOSIS — R221 Localized swelling, mass and lump, neck: Secondary | ICD-10-CM

## 2017-06-06 NOTE — Telephone Encounter (Signed)
Pt called states Brian Craig saw him last week regarding lump on neck -- He was prescribed antibiotics & to to call back w/ report--Pt states Lump still there & ask for provider to gv instructions if referral to Endocrinologist( just an  FYI he already sees [name])-- Patient states will be out of town from [name], 6/28-  Saturday, July 7th-- --glh

## 2017-06-07 NOTE — Telephone Encounter (Signed)
Referral placed for endocrinology appointment.

## 2017-06-13 ENCOUNTER — Other Ambulatory Visit: Payer: Self-pay | Admitting: *Deleted

## 2017-06-13 DIAGNOSIS — G4733 Obstructive sleep apnea (adult) (pediatric): Secondary | ICD-10-CM

## 2017-06-13 DIAGNOSIS — Z9989 Dependence on other enabling machines and devices: Principal | ICD-10-CM

## 2017-06-13 NOTE — Progress Notes (Signed)
Scheduled patient to have BIPAP titration 07/19/17.

## 2017-06-14 ENCOUNTER — Telehealth: Payer: Self-pay | Admitting: Cardiology

## 2017-06-14 ENCOUNTER — Encounter: Payer: Medicare Other | Admitting: *Deleted

## 2017-06-14 ENCOUNTER — Telehealth: Payer: Self-pay | Admitting: *Deleted

## 2017-06-14 NOTE — Telephone Encounter (Signed)
Left message to return a call to discuss sleep study results and recommendations. 

## 2017-06-14 NOTE — Telephone Encounter (Signed)
-----   Message from Brian Sine, MD sent at 06/03/2017  1:43 PM EDT ----- Brian Craig, please set up for titration study.  The patient did not tolerate CPAP in the past and had been on ASV  since 2007.  He probably will need BiPAP titration with possible transition to ASV

## 2017-06-14 NOTE — Telephone Encounter (Signed)
LMOVM reminding pt to send remote transmission.   

## 2017-06-14 NOTE — Telephone Encounter (Signed)
Called and left message to return a call to discuss sleep study results and recommendations.

## 2017-06-14 NOTE — Progress Notes (Signed)
06/14/17 Left message to return a call to discuss.

## 2017-06-17 ENCOUNTER — Encounter: Payer: Self-pay | Admitting: Cardiology

## 2017-06-17 NOTE — Telephone Encounter (Signed)
Patient notified of sleep study results and recommendations. Titration study has been scheduled on 07/19/17.

## 2017-06-17 NOTE — Progress Notes (Signed)
Patient notified

## 2017-06-17 NOTE — Telephone Encounter (Signed)
-----   Message from Troy Sine, MD sent at 06/03/2017  1:43 PM EDT ----- [name], please set up for titration study.  The patient did not tolerate CPAP in the past and had been on ASV  since 2007.  He probably will need BiPAP titration with possible transition to ASV

## 2017-06-17 NOTE — Progress Notes (Signed)
Letter  

## 2017-06-24 ENCOUNTER — Ambulatory Visit (INDEPENDENT_AMBULATORY_CARE_PROVIDER_SITE_OTHER): Payer: Medicare Other | Admitting: *Deleted

## 2017-06-24 DIAGNOSIS — I420 Dilated cardiomyopathy: Secondary | ICD-10-CM | POA: Diagnosis not present

## 2017-06-27 NOTE — Progress Notes (Signed)
Remote ICD transmission.   

## 2017-06-28 ENCOUNTER — Encounter: Payer: Self-pay | Admitting: Cardiology

## 2017-07-14 DIAGNOSIS — E039 Hypothyroidism, unspecified: Secondary | ICD-10-CM | POA: Diagnosis not present

## 2017-07-14 DIAGNOSIS — R221 Localized swelling, mass and lump, neck: Secondary | ICD-10-CM | POA: Diagnosis not present

## 2017-07-14 DIAGNOSIS — D490 Neoplasm of unspecified behavior of digestive system: Secondary | ICD-10-CM | POA: Insufficient documentation

## 2017-07-19 ENCOUNTER — Ambulatory Visit (HOSPITAL_BASED_OUTPATIENT_CLINIC_OR_DEPARTMENT_OTHER): Payer: Medicare Other | Attending: Cardiovascular Disease | Admitting: Cardiovascular Disease

## 2017-07-19 VITALS — Ht 73.0 in | Wt 332.0 lb

## 2017-07-19 DIAGNOSIS — G4761 Periodic limb movement disorder: Secondary | ICD-10-CM | POA: Diagnosis not present

## 2017-07-19 DIAGNOSIS — G4733 Obstructive sleep apnea (adult) (pediatric): Secondary | ICD-10-CM

## 2017-07-19 DIAGNOSIS — Z9989 Dependence on other enabling machines and devices: Secondary | ICD-10-CM

## 2017-07-20 DIAGNOSIS — D110 Benign neoplasm of parotid gland: Secondary | ICD-10-CM | POA: Diagnosis not present

## 2017-07-20 DIAGNOSIS — D490 Neoplasm of unspecified behavior of digestive system: Secondary | ICD-10-CM | POA: Diagnosis not present

## 2017-07-22 LAB — CUP PACEART REMOTE DEVICE CHECK
Brady Statistic AP VP Percent: 0 %
Brady Statistic AP VS Percent: 0 %
Brady Statistic AS VP Percent: 99.4 %
Brady Statistic AS VS Percent: 0.6 %
Brady Statistic RV Percent Paced: 99.1 %
HIGH POWER IMPEDANCE MEASURED VALUE: 43 Ohm
HighPow Impedance: 55 Ohm
Implantable Lead Implant Date: 20071217
Implantable Lead Implant Date: 20071217
Implantable Lead Location: 753858
Implantable Lead Location: 753860
Implantable Lead Model: 5076
Implantable Lead Model: 6947
Lead Channel Impedance Value: 304 Ohm
Lead Channel Impedance Value: 361 Ohm
Lead Channel Impedance Value: 608 Ohm
Lead Channel Impedance Value: 817 Ohm
Lead Channel Pacing Threshold Amplitude: 1 V
Lead Channel Pacing Threshold Pulse Width: 0.8 ms
Lead Channel Sensing Intrinsic Amplitude: 4.125 mV
Lead Channel Sensing Intrinsic Amplitude: 4.125 mV
Lead Channel Setting Pacing Amplitude: 2.25 V
Lead Channel Setting Pacing Pulse Width: 0.4 ms
MDC IDC LEAD IMPLANT DT: 20071217
MDC IDC LEAD LOCATION: 753859
MDC IDC MSMT BATTERY REMAINING LONGEVITY: 62 mo
MDC IDC MSMT BATTERY VOLTAGE: 2.99 V
MDC IDC MSMT LEADCHNL RA IMPEDANCE VALUE: 361 Ohm
MDC IDC MSMT LEADCHNL RV IMPEDANCE VALUE: 304 Ohm
MDC IDC MSMT LEADCHNL RV PACING THRESHOLD AMPLITUDE: 0.75 V
MDC IDC MSMT LEADCHNL RV PACING THRESHOLD PULSEWIDTH: 0.4 ms
MDC IDC PG IMPLANT DT: 20160524
MDC IDC SESS DTM: 20180713224220
MDC IDC SET LEADCHNL LV PACING PULSEWIDTH: 0.8 ms
MDC IDC SET LEADCHNL RV PACING AMPLITUDE: 2.5 V
MDC IDC SET LEADCHNL RV SENSING SENSITIVITY: 0.3 mV
MDC IDC STAT BRADY RA PERCENT PACED: 0 %

## 2017-07-27 ENCOUNTER — Telehealth: Payer: Self-pay | Admitting: Cardiovascular Disease

## 2017-07-27 NOTE — Telephone Encounter (Signed)
Brian Craig is calling to see if his cpap machine has been ordered for him as of yet . Please call

## 2017-07-27 NOTE — Telephone Encounter (Signed)
Returned call to patient of Dr. C/Dr. Claiborne Billings  He had a BiPAP titration 8/7 Informed him that paperwork will be submitted once it is available, for him to get new machine  Will route to [place], [employer]

## 2017-08-08 ENCOUNTER — Telehealth: Payer: Self-pay | Admitting: Cardiovascular Disease

## 2017-08-08 NOTE — Telephone Encounter (Signed)
Spoke with pt, aware study has not been read yet. Will be in touch once received.

## 2017-08-08 NOTE — Telephone Encounter (Signed)
New message      Calling to check on the status on getting a new CPAP machine

## 2017-08-11 DIAGNOSIS — D110 Benign neoplasm of parotid gland: Secondary | ICD-10-CM | POA: Diagnosis not present

## 2017-08-11 DIAGNOSIS — D490 Neoplasm of unspecified behavior of digestive system: Secondary | ICD-10-CM | POA: Diagnosis not present

## 2017-08-11 DIAGNOSIS — Z8719 Personal history of other diseases of the digestive system: Secondary | ICD-10-CM | POA: Diagnosis not present

## 2017-08-11 DIAGNOSIS — F17210 Nicotine dependence, cigarettes, uncomplicated: Secondary | ICD-10-CM | POA: Diagnosis not present

## 2017-08-13 NOTE — Procedures (Signed)
Patient Name: Brian Craig, Brian Craig Date: 07/19/2017 Gender: Male D.O.B: 1951/01/10 Age (years): 83 Referring Provider: Shelva Majestic MD, ABSM Height (inches): 73 Interpreting Physician: Shelva Majestic MD, ABSM Weight (lbs): 332 RPSGT: Jorge Ny BMI: 44 MRN: [id number] Neck Size: 19.50  CLINICAL INFORMATION The patient is referred for a BiPAP titration to treat sleep apnea.  Date of NPSG: 05/12/2017: AHI 57.7/h;  RDI 71.9/h  SLEEP STUDY TECHNIQUE As per the AASM Manual for the Scoring of Sleep and Associated Events v2.3 (April 2016) with a hypopnea requiring 4% desaturations.  The channels recorded and monitored were frontal, central and occipital EEG, electrooculogram (EOG), submentalis EMG (chin), nasal and oral airflow, thoracic and abdominal wall motion, anterior tibialis EMG, snore microphone, electrocardiogram, and pulse oximetry. Bilevel positive airway pressure (BPAP) was initiated at the beginning of the study and titrated to treat sleep-disordered breathing.  MEDICATIONS acetaminophen (TYLENOL) 325 MG tablet ALPRAZolam (XANAX) 0.5 MG tablet amoxicillin-clavulanate (AUGMENTIN) 875-125 MG tablet carvedilol (COREG CR) 80 MG 24 hr capsule Cholecalciferol (CVS D3) 2000 units CAPS furosemide (LASIX) 40 MG tablet KLOR-CON 10 10 MEQ tablet levothyroxine (SYNTHROID, LEVOTHROID) 200 MCG tablet lisinopril (PRINIVIL,ZESTRIL) 10 MG tablet Multiple Vitamin (MULTIVITAMIN) tablet PRADAXA 150 MG CAPS capsule sertraline (ZOLOFT) 50 MG tablet simvastatin (ZOCOR) 40 MG tablet  Medications self-administered by patient taken the night of the study : TYLENOL PM, MELATONIN, XANAX  RESPIRATORY PARAMETERS Optimal IPAP Pressure (cm): 16 AHI at Optimal Pressure (/hr) 0.0 Optimal EPAP Pressure (cm): 12   Overall Minimal O2 (%): 90.00 Minimal O2 at Optimal Pressure (%): 92.0 SLEEP ARCHITECTURE Start Time: 10:31:09 PM Stop Time: 5:21:51 AM Total Time (min): 410.7 Total Sleep Time  (min): 300.0 Sleep Latency (min): 16.4 Sleep Efficiency (%): 73.0 REM Latency (min): 238.5 WASO (min): 94.3 Stage N1 (%): 7.33 Stage N2 (%): 78.33 Stage N3 (%): 0.00 Stage R (%): 14.33 Supine (%): 59.69 Arousal Index (/hr): 33.8      CARDIAC DATA The 2 lead EKG demonstrated pacemaker generated. The mean heart rate was 70.07 beats per minute. Other EKG findings include: PVCs.  LEG MOVEMENT DATA The total Periodic Limb Movements of Sleep (PLMS) were 560. The PLMS index was 112.00. A PLMS index of <15 is considered normal in adults.  IMPRESSIONS - The patient underwent BiPAP titration and was titrated up to 16/12 cm water pressure.  AHI was 0, with O2 desaturation to 92% at 16/12 cwp. - Central sleep apnea was not noted during this titration (CAI = 0.0/h). - Significant oxygen desaturations were not observed during this titration (min O2 = 90.00%). - The patient snored with Moderate snoring volume. - 2-lead EKG demonstrated: PVCs - Severe periodic limb movements were observed during this study. Arousals associated with PLMs were rare.  DIAGNOSIS - Obstructive Sleep Apnea (327.23 [G47.33 ICD-10]) - Periodic limb movement disorder of sleep  RECOMMENDATIONS - Recommend an initial trial of BiPAP therapy at 16/12 cm H2O with heated humidification.  A Large size Resmed Full Face Mask AirFit F10 mask was used for the titration study. - Effort should be made to optimize nasal and oral pharyngeal patency. - in this patient with a significant PLMS index of 112, if patient is symptomatic with restless legs on BiPAP consider a trial of pharmacotherapy for restless legs syndrome. - Avoid alcohol, sedatives and other CNS depressants that may worsen sleep apnea and disrupt normal sleep architecture. - Sleep hygiene should be reviewed to assess factors that may improve sleep quality. - Weight management and regular exercise should be  initiated or continued. - Recommend a download be obtained in 4 weeks  and sleep clinic evaluation after 4 weeks of therapy.  [Electronically signed] 08/13/2017 03:49 PM  Shelva Majestic MD, [facility], ABSM Diplomate, American Board of Sleep Medicine  NPI: [id number] [place] PH: [phone]   FX: [phone] [place]

## 2017-08-16 ENCOUNTER — Telehealth: Payer: Self-pay | Admitting: Cardiovascular Disease

## 2017-08-16 NOTE — Telephone Encounter (Signed)
New message    Pt is calling asking for a call back. He is calling about his cpap machine to see if it was ordered. Please call.

## 2017-08-16 NOTE — Telephone Encounter (Signed)
See telephone note 9/4

## 2017-08-16 NOTE — Telephone Encounter (Signed)
Left detailed message (ok per DPR) making patient aware that Bipap orders faxed to [facility] for setup.     Advised to call with further questions or concerns.

## 2017-08-16 NOTE — Telephone Encounter (Signed)
Brian Craig, can you review & f/u w patient? Looks like sleep study was completed and Dr. Claiborne Billings entered recommendations.  "- Recommend an initial trial of BiPAP therapy at 16/12 cm H2O with heated humidification. A Large size Resmed Full Face Mask AirFit F10 mask was used for the titration study."

## 2017-08-25 ENCOUNTER — Encounter: Payer: Self-pay | Admitting: Adult Health

## 2017-08-25 ENCOUNTER — Ambulatory Visit (INDEPENDENT_AMBULATORY_CARE_PROVIDER_SITE_OTHER): Payer: Medicare Other | Admitting: Adult Health

## 2017-08-25 ENCOUNTER — Encounter: Payer: Self-pay | Admitting: Family Medicine

## 2017-08-25 VITALS — BP 104/70 | HR 73 | Ht 73.0 in | Wt 334.0 lb

## 2017-08-25 DIAGNOSIS — Z0000 Encounter for general adult medical examination without abnormal findings: Secondary | ICD-10-CM | POA: Diagnosis not present

## 2017-08-25 DIAGNOSIS — F419 Anxiety disorder, unspecified: Secondary | ICD-10-CM | POA: Insufficient documentation

## 2017-08-25 DIAGNOSIS — F1021 Alcohol dependence, in remission: Secondary | ICD-10-CM | POA: Diagnosis not present

## 2017-08-25 DIAGNOSIS — Z9989 Dependence on other enabling machines and devices: Secondary | ICD-10-CM | POA: Diagnosis not present

## 2017-08-25 DIAGNOSIS — E039 Hypothyroidism, unspecified: Secondary | ICD-10-CM | POA: Diagnosis not present

## 2017-08-25 DIAGNOSIS — Z720 Tobacco use: Secondary | ICD-10-CM

## 2017-08-25 DIAGNOSIS — I10 Essential (primary) hypertension: Secondary | ICD-10-CM | POA: Insufficient documentation

## 2017-08-25 DIAGNOSIS — E559 Vitamin D deficiency, unspecified: Secondary | ICD-10-CM

## 2017-08-25 DIAGNOSIS — G4733 Obstructive sleep apnea (adult) (pediatric): Secondary | ICD-10-CM

## 2017-08-25 MED ORDER — ALPRAZOLAM 0.5 MG PO TABS: 0.5000 mg | ORAL_TABLET | Freq: Three times a day (TID) | ORAL | 0 refills | Status: DC | PRN

## 2017-08-25 NOTE — Assessment & Plan Note (Signed)
Reports compliancy on CPAP

## 2017-08-25 NOTE — Assessment & Plan Note (Signed)
Declined smoking cessation today.

## 2017-08-25 NOTE — Assessment & Plan Note (Signed)
BP at goal 104/70 Continue with Lisinopril 10mg , Furosemide 40mg , Carvedilol 80mg  daily. He has cards appt 08/29/17

## 2017-08-25 NOTE — Assessment & Plan Note (Signed)
Vit d level drawn today.  He has been taking OTC Vit d 2000 daily since March 2018. He reports noticeable decrease in fatigue.

## 2017-08-25 NOTE — Assessment & Plan Note (Signed)
30 years of sobriety in 2019

## 2017-08-25 NOTE — Assessment & Plan Note (Addendum)
[place] Controlled Substance Database verified- no contraindications noted to RF Alprazolam 0.5mg . Discussed relaxation techniques to reduce stress/anxiety.

## 2017-08-25 NOTE — Progress Notes (Addendum)
Subjective:    Patient ID: Brian Craig, male    DOB: [birth year], 66 y.o.   MRN: [id number]  HPI:  [name] is here for regular f/u:  HTN, depression, anxiety.  He is compliant on all medications and denies SE.  He denies CP/dyspnea/palpitations/sig cough.  He continues to smoke 1/2pack and day and again declines smoking cessation.  He drinks 1.5 gallons of decaf/artifically sweetened tea and maybe 1-2 bottles of water.  He follows a "decent diet" and denies formal exercise, however performs house/yard work daily.  He continues to abstain from ETOH, practice AA and will hit 30 years of sobriety 2019.  He was seen by ENT and bx of Parotid gland negative for ca Sept 2018.  Patient Care Team    Relationship Specialty Notifications Start End  Mellody Dance, DO PCP - General Family Medicine  03/15/17   Brendolyn Patty, MD  Dermatology  02/21/17   Warden Fillers, MD Consulting Physician Ophthalmology  02/21/17   Amalia Greenhouse, MD Referring Physician Endocrinology  02/21/17   Sanda Klein, MD Consulting Physician Cardiology  02/21/17     Patient Active Problem List   Diagnosis Date Noted  . HTN (hypertension) 08/25/2017  . Anxiety 08/25/2017  . Healthcare maintenance 08/25/2017  . Localized swelling, mass or lump of neck 06/01/2017  . Vitamin D deficiency 03/04/2017  . Long term current use of anticoagulant 09/20/2016  . Chronic diastolic heart failure ([place]) 02/02/2016  . Hypothyroidism 02/02/2016  . History of alcohol dependence ([place]) 02/02/2016  . ICD (implantable cardioverter-defibrillator) battery depletion 04/17/2015  . Congestive dilated cardiomyopathy ([place]) 07/15/2014  . Chronic combined systolic and diastolic CHF, NYHA class 1 ([place]) 07/15/2014  . OSA on CPAP 07/15/2014  . Morbid obesity ([place]) 07/15/2014  . Permanent atrial fibrillation ([place]) 07/15/2014  . CHB (complete heart block) ([place]) 07/15/2014  . Biventricular ICD  implanted 2007, generator change in 2012 07/15/2014  .  Tobacco abuse 07/15/2014  . Alcoholism in recovery ([place]) 07/15/2014  . Hypothyroidism, postradioiodine therapy 07/15/2014  . Dyslipidemia (high LDL; low HDL) 07/15/2014     Past Medical History:  Diagnosis Date  . Atrial fibrillation ([place])   . CHF (congestive heart failure) ([place])   . Graves disease    Graves  . Hyperlipidemia   . Hypertension   . OSA treated with BiPAP   . Substance abuse    alcohol     Past Surgical History:  Procedure Laterality Date  . APPENDECTOMY    . BIV ICD GENERTAOR CHANGE OUT Left 2012   Medtronic Protecta  . CARDIAC ELECTROPHYSIOLOGY STUDY AND ABLATION    . EP IMPLANTABLE DEVICE N/A 05/06/2015   Procedure: ICD Generator Changeout;  Surgeon: Sanda Klein, MD;  Location: [facility];  Service: Cardiovascular;  Laterality: N/A;  . HERNIA REPAIR    . MENISCUS REPAIR Right   . VASECTOMY     unilateral due to injury     Family History  Problem Relation Age of Onset  . Heart disease Mother   . Hypertension Mother   . Heart disease Father   . Hyperlipidemia Father   . Hypertension Father   . Heart attack Father   . Heart disease Paternal Aunt   . Heart disease Paternal Uncle   . Alcohol abuse Maternal Uncle   . Alcohol abuse Maternal Grandfather      History  Drug Use No     History  Alcohol Use No    Comment: recovering alcoholic  History  Smoking Status  . Current Every Day Smoker  . Packs/day: 0.50  . Years: 51.00  . Types: Cigarettes  Smokeless Tobacco  . Never Used     Outpatient Encounter Prescriptions as of 08/25/2017  Medication Sig  . acetaminophen (TYLENOL) 325 MG tablet Take 650 mg by mouth every 6 (six) hours as needed for headache.  . ALPRAZolam (XANAX) 0.5 MG tablet Take 1 tablet (0.5 mg total) by mouth 3 (three) times daily as needed for anxiety.  . carvedilol (COREG CR) 80 MG 24 hr capsule TAKE 1 CAPSULE DAILY  . Cholecalciferol ([employer] D3) 2000 units CAPS Take 2,000 Units by mouth daily.  .  furosemide (LASIX) 40 MG tablet TAKE 1 TABLET DAILY  . KLOR-CON 10 10 MEQ tablet TAKE 1 TABLET DAILY  . levothyroxine (SYNTHROID, LEVOTHROID) 200 MCG tablet Take 200 mcg by mouth daily before breakfast.  . lisinopril (PRINIVIL,ZESTRIL) 10 MG tablet Take 10 mg by mouth 2 (two) times daily.  . Multiple Vitamin (MULTIVITAMIN) tablet Take 1 tablet by mouth daily.  Marland Kitchen PRADAXA 150 MG CAPS capsule TAKE 1 CAPSULE TWICE DAILY  . sertraline (ZOLOFT) 50 MG tablet Take 1 tablet (50 mg total) by mouth daily.  . simvastatin (ZOCOR) 40 MG tablet Take 40 mg by mouth daily.  . [DISCONTINUED] ALPRAZolam (XANAX) 0.5 MG tablet Take 1 tablet (0.5 mg total) by mouth 3 (three) times daily as needed for anxiety.  . [DISCONTINUED] amoxicillin-clavulanate (AUGMENTIN) 875-125 MG tablet Take 1 tablet by mouth 2 (two) times daily.  . [DISCONTINUED] lisinopril (PRINIVIL,ZESTRIL) 10 MG tablet Take 1 tablet (10 mg total) by mouth daily. (Patient taking differently: Take 10 mg by mouth 2 (two) times daily. )   No facility-administered encounter medications on file as of 08/25/2017.     Allergies: Patient has no known allergies.  Body mass index is 44.07 kg/m.  Blood pressure 104/70, pulse 73, height 6\' 1"  (1.854 m), weight (!) 334 lb (151.5 kg).   Review of Systems  Constitutional: Positive for fatigue. Negative for activity change, appetite change, chills, diaphoresis, fever and unexpected weight change.  HENT: Negative for congestion.   Eyes: Negative for visual disturbance.  Respiratory: Negative for cough, chest tightness, shortness of breath, wheezing and stridor.   Cardiovascular: Positive for leg swelling. Negative for chest pain and palpitations.  Gastrointestinal: Negative for abdominal distention, abdominal pain, blood in stool, constipation, diarrhea, nausea and vomiting.  Endocrine: Negative for cold intolerance, heat intolerance, polydipsia, polyphagia and polyuria.  Genitourinary: Negative for difficulty  urinating, flank pain and hematuria.  Musculoskeletal: Negative for arthralgias, back pain, gait problem, joint swelling, myalgias, neck pain and neck stiffness.  Skin: Negative for color change, pallor, rash and wound.  Neurological: Negative for dizziness, tremors, weakness and headaches.  Hematological: Does not bruise/bleed easily.  Psychiatric/Behavioral: Negative for self-injury, sleep disturbance and suicidal ideas. The patient is nervous/anxious.        Objective:   Physical Exam  Constitutional: He is oriented to person, place, and time. He appears well-developed and well-nourished. No distress.  HENT:  Head: Normocephalic and atraumatic.  Right Ear: External ear normal.  Left Ear: External ear normal.  Eyes: Pupils are equal, round, and reactive to light. Conjunctivae are normal.  Neck: Normal range of motion. Neck supple.  Cardiovascular: Normal rate, regular rhythm, normal heart sounds and intact distal pulses.   No murmur heard. Pulmonary/Chest: Effort normal and breath sounds normal. No respiratory distress. He has no wheezes. He has no rales. He exhibits  no tenderness.  Musculoskeletal: He exhibits edema.       Right ankle: He exhibits swelling.       Left ankle: He exhibits swelling.       Right lower leg: He exhibits swelling.       Left lower leg: He exhibits swelling.  Lymphadenopathy:    He has no cervical adenopathy.  Neurological: He is alert and oriented to person, place, and time. Coordination normal.  Skin: Skin is warm and dry. No rash noted. He is not diaphoretic. No erythema. No pallor.  Psychiatric: He has a normal mood and affect. His behavior is normal. Judgment and thought content normal.  Nursing note and vitals reviewed.         Assessment & Plan:   1. Vitamin D deficiency   2. Hypothyroidism, unspecified type   3. Alcoholism in recovery ([place])   4. Essential hypertension   5. Tobacco abuse   6. OSA on CPAP   7. Anxiety   8. Healthcare  maintenance     Vitamin D deficiency Vit d level drawn today.  He has been taking OTC Vit d 2000 daily since March 2018. He reports noticeable decrease in fatigue.  Alcoholism in recovery 30 years of sobriety in 2019  HTN (hypertension) BP at goal 104/70 Continue with Lisinopril 10mg , Furosemide 40mg , Carvedilol 80mg  daily. He has cards appt 08/29/17  Tobacco abuse Declined smoking cessation today.  OSA on CPAP Reports compliancy on CPAP  Anxiety [place] Controlled Substance Database verified- no contraindications noted to RF Alprazolam 0.5mg . Discussed relaxation techniques to reduce stress/anxiety.   Healthcare maintenance Increase water, heart healthy diet, work on weight loss.  Increase daily walking. TOBACCO CESSATION! CPE in 6 months.    FOLLOW-UP:  Return in about 6 months (around 02/22/2018) for CPE.

## 2017-08-25 NOTE — Patient Instructions (Signed)
Heart-Healthy Eating Plan Many factors influence your heart health, including eating and exercise habits. Heart (coronary) risk increases with abnormal blood fat (lipid) levels. Heart-healthy meal planning includes limiting unhealthy fats, increasing healthy fats, and making other small dietary changes. This includes maintaining a healthy body weight to help keep lipid levels within a normal range. What is my plan? Your health care provider recommends that you:  Get no more than __25___% of the total calories in your daily diet from fat.  Limit your intake of saturated fat to less than __5__% of your total calories each day. Limit the amount of cholesterol in your diet to less than _ Generalized Anxiety Disorder, Adult Generalized anxiety disorder (GAD) is a mental health disorder. People with this condition constantly worry about everyday events. Unlike normal anxiety, worry related to GAD is not triggered by a specific event. These worries also do not fade or get better with time. GAD interferes with life functions, including relationships, work, and school. GAD can vary from mild to severe. People with severe GAD can have intense waves of anxiety with physical symptoms (panic attacks). What are the causes? The exact cause of GAD is not known. What increases the risk? This condition is more likely to develop in:  Women.  People who have a family history of anxiety disorders.  People who are very shy.  People who experience very stressful life events, such as the death of a loved one.  People who have a very stressful family environment.  What are the signs or symptoms? People with GAD often worry excessively about many things in their lives, such as their health and family. They may also be overly concerned about:  Doing well at work.  Being on time.  Natural disasters.  Friendships.  Physical symptoms of GAD include:  Fatigue.  Muscle tension or having muscle  twitches.  Trembling or feeling shaky.  Being easily startled.  Feeling like your heart is pounding or racing.  Feeling out of breath or like you cannot take a deep breath.  Having trouble falling asleep or staying asleep.  Sweating.  Nausea, diarrhea, or irritable bowel syndrome (IBS).  Headaches.  Trouble concentrating or remembering facts.  Restlessness.  Irritability.  How is this diagnosed? Your health care provider can diagnose GAD based on your symptoms and medical history. You will also have a physical exam. The health care provider will ask specific questions about your symptoms, including how severe they are, when they started, and if they come and go. Your health care provider may ask you about your use of alcohol or drugs, including prescription medicines. Your health care provider may refer you to a mental health specialist for further evaluation. Your health care provider will do a thorough examination and may perform additional tests to rule out other possible causes of your symptoms. To be diagnosed with GAD, a person must have anxiety that:  Is out of his or her control.  Affects several different aspects of his or her life, such as work and relationships.  Causes distress that makes him or her unable to take part in normal activities.  Includes at least three physical symptoms of GAD, such as restlessness, fatigue, trouble concentrating, irritability, muscle tension, or sleep problems.  Before your health care provider can confirm a diagnosis of GAD, these symptoms must be present more days than they are not, and they must last for six months or longer. How is this treated? The following therapies are usually used to  treat GAD:  Medicine. Antidepressant medicine is usually prescribed for long-term daily control. Antianxiety medicines may be added in severe cases, especially when panic attacks occur.  Talk therapy (psychotherapy). Certain types of talk  therapy can be helpful in treating GAD by providing support, education, and guidance. Options include: ? Cognitive behavioral therapy (CBT). People learn coping skills and techniques to ease their anxiety. They learn to identify unrealistic or negative thoughts and behaviors and to replace them with positive ones. ? Acceptance and commitment therapy (ACT). This treatment teaches people how to be mindful as a way to cope with unwanted thoughts and feelings. ? Biofeedback. This process trains you to manage your body's response (physiological response) through breathing techniques and relaxation methods. You will work with a therapist while machines are used to monitor your physical symptoms.  Stress management techniques. These include yoga, meditation, and exercise.  A mental health specialist can help determine which treatment is best for you. Some people see improvement with one type of therapy. However, other people require a combination of therapies. Follow these instructions at home:  Take over-the-counter and prescription medicines only as told by your health care provider.  Try to maintain a normal routine.  Try to anticipate stressful situations and allow extra time to manage them.  Practice any stress management or self-calming techniques as taught by your health care provider.  Do not punish yourself for setbacks or for not making progress.  Try to recognize your accomplishments, even if they are small.  Keep all follow-up visits as told by your health care provider. This is important. Contact a health care provider if:  Your symptoms do not get better.  Your symptoms get worse.  You have signs of depression, such as: ? A persistently sad, cranky, or irritable mood. ? Loss of enjoyment in activities that used to bring you joy. ? Change in weight or eating. ? Changes in sleeping habits. ? Avoiding friends or family members. ? Loss of energy for normal tasks. ? Feelings of  guilt or worthlessness. Get help right away if:  You have serious thoughts about hurting yourself or others. If you ever feel like you may hurt yourself or others, or have thoughts about taking your own life, get help right away. You can go to your nearest emergency department or call:  Your local emergency services (911 in the [place]  A suicide crisis helpline, such as the [facility] at (251)059-7275. This is open 24 hours a day.  Summary  Generalized anxiety disorder (GAD) is a mental health disorder that involves worry that is not triggered by a specific event.  People with GAD often worry excessively about many things in their lives, such as their health and family.  GAD may cause physical symptoms such as restlessness, trouble concentrating, sleep problems, frequent sweating, nausea, diarrhea, headaches, and trembling or muscle twitching.  A mental health specialist can help determine which treatment is best for you. Some people see improvement with one type of therapy. However, other people require a combination of therapies. This information is not intended to replace advice given to you by your health care provider. Make sure you discuss any questions you have with your health care provider. Document Released: 03/26/2013 Document Revised: 10/19/2016 Document Reviewed: 10/19/2016 Elsevier Interactive Patient Education  2018 [employer].  300__ mg per day.  What types of fat should I choose?  Choose healthy fats more often. Choose monounsaturated and polyunsaturated fats, such as olive oil and canola  oil, flaxseeds, walnuts, almonds, and seeds.  Eat more omega-3 fats. Good choices include salmon, mackerel, sardines, tuna, flaxseed oil, and ground flaxseeds. Aim to eat fish at least two times each week.  Limit saturated fats. Saturated fats are primarily found in animal products, such as meats, butter, and cream. Plant sources of saturated fats include  palm oil, palm kernel oil, and coconut oil.  Avoid foods with partially hydrogenated oils in them. These contain trans fats. Examples of foods that contain trans fats are stick margarine, some tub margarines, cookies, crackers, and other baked goods. What general guidelines do I need to follow?  Check food labels carefully to identify foods with trans fats or high amounts of saturated fat.  Fill one half of your plate with vegetables and green salads. Eat 4-5 servings of vegetables per day. A serving of vegetables equals 1 cup of raw leafy vegetables,  cup of raw or cooked cut-up vegetables, or  cup of vegetable juice.  Fill one fourth of your plate with whole grains. Look for the word "whole" as the first word in the ingredient list.  Fill one fourth of your plate with lean protein foods.  Eat 4-5 servings of fruit per day. A serving of fruit equals one medium whole fruit,  cup of dried fruit,  cup of fresh, frozen, or canned fruit, or  cup of 100% fruit juice.  Eat more foods that contain soluble fiber. Examples of foods that contain this type of fiber are apples, broccoli, carrots, beans, peas, and barley. Aim to get 20-30 g of fiber per day.  Eat more home-cooked food and less restaurant, buffet, and fast food.  Limit or avoid alcohol.  Limit foods that are high in starch and sugar.  Avoid fried foods.  Cook foods by using methods other than frying. Baking, boiling, grilling, and broiling are all great options. Other fat-reducing suggestions include: ? Removing the skin from poultry. ? Removing all visible fats from meats. ? Skimming the fat off of stews, soups, and gravies before serving them. ? Steaming vegetables in water or broth.  Lose weight if you are overweight. Losing just 5-10% of your initial body weight can help your overall health and prevent diseases such as diabetes and heart disease.  Increase your consumption of nuts, legumes, and seeds to 4-5 servings per  week. One serving of dried beans or legumes equals  cup after being cooked, one serving of nuts equals 1 ounces, and one serving of seeds equals  ounce or 1 tablespoon.  You may need to monitor your salt (sodium) intake, especially if you have high blood pressure. Talk with your health care provider or dietitian to get more information about reducing sodium. What foods can I eat? Grains  Breads, including [place], white, pita, wheat, raisin, rye, oatmeal, and [place]. Tortillas that are neither fried nor made with lard or trans fat. Low-fat rolls, including hotdog and hamburger buns and English muffins. Biscuits. Muffins. Waffles. Pancakes. Light popcorn. Whole-grain cereals. Flatbread. Melba toast. Pretzels. Breadsticks. Rusks. Low-fat snacks and crackers, including oyster, saltine, matzo, graham, animal, and rye. Rice and pasta, including brown rice and those that are made with whole wheat. Vegetables All vegetables. Fruits All fruits, but limit coconut. Meats and Other Protein Sources Lean, well-trimmed beef, veal, pork, and lamb. Chicken and [place] without skin. All fish and shellfish. Wild duck, rabbit, pheasant, and venison. Egg whites or low-cholesterol egg substitutes. Dried beans, peas, lentils, and tofu.Seeds and most nuts. Dairy Low-fat or nonfat  cheeses, including ricotta, string, and mozzarella. Skim or 1% milk that is liquid, powdered, or evaporated. Buttermilk that is made with low-fat milk. Nonfat or low-fat yogurt. Beverages Mineral water. Diet carbonated beverages. Sweets and Desserts Sherbets and fruit ices. Honey, jam, marmalade, jelly, and syrups. Meringues and gelatins. Pure sugar candy, such as hard candy, jelly beans, gumdrops, mints, marshmallows, and small amounts of dark chocolate. [employer] Eat all sweets and desserts in moderation. Fats and Oils Nonhydrogenated (trans-free) margarines. Vegetable oils, including soybean, sesame, sunflower, olive, peanut,  safflower, corn, canola, and cottonseed. Salad dressings or mayonnaise that are made with a vegetable oil. Limit added fats and oils that you use for cooking, baking, salads, and as spreads. Other Cocoa powder. Coffee and tea. All seasonings and condiments. The items listed above may not be a complete list of recommended foods or beverages. Contact your dietitian for more options. What foods are not recommended? Grains Breads that are made with saturated or trans fats, oils, or whole milk. Croissants. Butter rolls. Cheese breads. Sweet rolls. Donuts. Buttered popcorn. Chow mein noodles. High-fat crackers, such as cheese or butter crackers. Meats and Other Protein Sources Fatty meats, such as hotdogs, short ribs, sausage, spareribs, bacon, ribeye roast or steak, and mutton. High-fat deli meats, such as salami and bologna. Caviar. Domestic duck and goose. Organ meats, such as kidney, liver, sweetbreads, brains, gizzard, chitterlings, and heart. Dairy Cream, sour cream, cream cheese, and creamed cottage cheese. Whole milk cheeses, including blue (bleu), Monte[place] Jack, [place], [place], [place], [place], [place], [place], [place], and [place]. Whole or 2% milk that is liquid, evaporated, or condensed. Whole buttermilk. Cream sauce or high-fat cheese sauce. Yogurt that is made from whole milk. Beverages Regular sodas and drinks with added sugar. Sweets and Desserts Frosting. Pudding. Cookies. Cakes other than angel food cake. Candy that has milk chocolate or white chocolate, hydrogenated fat, butter, coconut, or unknown ingredients. Buttered syrups. Full-fat ice cream or ice cream drinks. Fats and Oils Gravy that has suet, meat fat, or shortening. Cocoa butter, hydrogenated oils, palm oil, coconut oil, palm kernel oil. These can often be found in baked products, candy, fried foods, nondairy creamers, and whipped toppings. Solid fats and shortenings, including bacon fat, salt pork, lard, and butter. Nondairy  cream substitutes, such as coffee creamers and sour cream substitutes. Salad dressings that are made of unknown oils, cheese, or sour cream. The items listed above may not be a complete list of foods and beverages to avoid. Contact your dietitian for more information. This information is not intended to replace advice given to you by your health care provider. Make sure you discuss any questions you have with your health care provider. Document Released: 09/07/2008 Document Revised: 06/18/2016 Document Reviewed: 05/23/2014 Elsevier Interactive Patient Education  2017 [employer].   Exercising to [employer] Exercising can help you to lose weight. In order to lose weight through exercise, you need to do vigorous-intensity exercise. You can tell that you are exercising with vigorous intensity if you are breathing very hard and fast and cannot hold a conversation while exercising. Moderate-intensity exercise helps to maintain your current weight. You can tell that you are exercising at a moderate level if you have a higher heart rate and faster breathing, but you are still able to hold a conversation. How often should I exercise? Choose an activity that you enjoy and set realistic goals. Your health care provider can help you to make an activity plan that works for you. Exercise regularly  as directed by your health care provider. This may include:  Doing resistance training twice each week, such as: ? Push-ups. ? Sit-ups. ? Lifting weights. ? Using resistance bands.  Doing a given intensity of exercise for a given amount of time. Choose from these options: ? 150 minutes of moderate-intensity exercise every week. ? 75 minutes of vigorous-intensity exercise every week. ? A mix of moderate-intensity and vigorous-intensity exercise every week.  Children, pregnant women, people who are out of shape, people who are overweight, and older adults may need to consult a health care provider for  individual recommendations. If you have any sort of medical condition, be sure to consult your health care provider before starting a new exercise program. What are some activities that can help me to lose weight?  Walking at a rate of at least 4.5 miles an hour.  Jogging or running at a rate of 5 miles per hour.  Biking at a rate of at least 10 miles per hour.  Lap swimming.  Roller-skating or in-line skating.  Cross-country skiing.  Vigorous competitive sports, such as football, basketball, and soccer.  Jumping rope.  Aerobic dancing. How can I be more active in my day-to-day activities?  Use the stairs instead of the elevator.  Take a walk during your lunch break.  If you drive, park your car farther away from work or school.  If you take public transportation, get off one stop early and walk the rest of the way.  Make all of your phone calls while standing up and walking around.  Get up, stretch, and walk around every 30 minutes throughout the day. What guidelines should I follow while exercising?  Do not exercise so much that you hurt yourself, feel dizzy, or get very short of breath.  Consult your health care provider prior to starting a new exercise program.  Wear comfortable clothes and shoes with good support.  Drink plenty of water while you exercise to prevent dehydration or heat stroke. Body water is lost during exercise and must be replaced.  Work out until you breathe faster and your heart beats faster. This information is not intended to replace advice given to you by your health care provider. Make sure you discuss any questions you have with your health care provider. Document Released: 01/01/2011 Document Revised: 05/06/2016 Document Reviewed: 05/02/2014 Elsevier Interactive Patient Education  2018 [employer] all medications as directed. We will call you when lab results are available. Increase water intake and follow heart healthy  diet. Please consider reducing-stop tobacco use. Please schedule full physical in 6 months. NICE TO SEE YOU!

## 2017-08-25 NOTE — Assessment & Plan Note (Signed)
Increase water, heart healthy diet, work on weight loss.  Increase daily walking. TOBACCO CESSATION! CPE in 6 months.

## 2017-08-26 LAB — TSH: TSH: 3.57 u[IU]/mL (ref 0.450–4.500)

## 2017-08-26 LAB — VITAMIN D 25 HYDROXY (VIT D DEFICIENCY, FRACTURES): Vit D, 25-Hydroxy: 34.3 ng/mL (ref 30.0–100.0)

## 2017-08-29 ENCOUNTER — Encounter: Payer: Self-pay | Admitting: Cardiovascular Disease

## 2017-08-29 ENCOUNTER — Ambulatory Visit (INDEPENDENT_AMBULATORY_CARE_PROVIDER_SITE_OTHER): Payer: Medicare Other | Admitting: Cardiovascular Disease

## 2017-08-29 VITALS — BP 119/81 | HR 80 | Ht 73.0 in | Wt 334.0 lb

## 2017-08-29 DIAGNOSIS — I5032 Chronic diastolic (congestive) heart failure: Secondary | ICD-10-CM

## 2017-08-29 DIAGNOSIS — Z95810 Presence of automatic (implantable) cardiac defibrillator: Secondary | ICD-10-CM

## 2017-08-29 DIAGNOSIS — Z9989 Dependence on other enabling machines and devices: Secondary | ICD-10-CM | POA: Diagnosis not present

## 2017-08-29 DIAGNOSIS — E785 Hyperlipidemia, unspecified: Secondary | ICD-10-CM | POA: Diagnosis not present

## 2017-08-29 DIAGNOSIS — G4733 Obstructive sleep apnea (adult) (pediatric): Secondary | ICD-10-CM

## 2017-08-29 DIAGNOSIS — E890 Postprocedural hypothyroidism: Secondary | ICD-10-CM

## 2017-08-29 DIAGNOSIS — I482 Chronic atrial fibrillation: Secondary | ICD-10-CM

## 2017-08-29 DIAGNOSIS — Z720 Tobacco use: Secondary | ICD-10-CM | POA: Diagnosis not present

## 2017-08-29 DIAGNOSIS — Z7901 Long term (current) use of anticoagulants: Secondary | ICD-10-CM

## 2017-08-29 DIAGNOSIS — I4821 Permanent atrial fibrillation: Secondary | ICD-10-CM

## 2017-08-29 DIAGNOSIS — I442 Atrioventricular block, complete: Secondary | ICD-10-CM

## 2017-08-29 DIAGNOSIS — F1021 Alcohol dependence, in remission: Secondary | ICD-10-CM | POA: Diagnosis not present

## 2017-08-29 MED ORDER — POTASSIUM CHLORIDE ER 10 MEQ PO TBCR: 10.0000 meq | EXTENDED_RELEASE_TABLET | Freq: Every day | ORAL | 3 refills | Status: DC

## 2017-08-29 MED ORDER — CARVEDILOL PHOSPHATE ER 80 MG PO CP24: 80.0000 mg | ORAL_CAPSULE | Freq: Every day | ORAL | 3 refills | Status: DC

## 2017-08-29 MED ORDER — SIMVASTATIN 40 MG PO TABS: 40.0000 mg | ORAL_TABLET | Freq: Every day | ORAL | 3 refills | Status: DC

## 2017-08-29 MED ORDER — FUROSEMIDE 40 MG PO TABS: 40.0000 mg | ORAL_TABLET | Freq: Every day | ORAL | 3 refills | Status: DC

## 2017-08-29 MED ORDER — DABIGATRAN ETEXILATE MESYLATE 150 MG PO CAPS: 150.0000 mg | ORAL_CAPSULE | Freq: Two times a day (BID) | ORAL | 3 refills | Status: DC

## 2017-08-29 MED ORDER — LISINOPRIL 10 MG PO TABS: 10.0000 mg | ORAL_TABLET | Freq: Two times a day (BID) | ORAL | 3 refills | Status: DC

## 2017-08-29 NOTE — Patient Instructions (Signed)
Dr Sallyanne Kuster recommends that you continue on your current medications as directed. Please refer to the Current Medication list given to you today.  Remote monitoring is used to monitor your Pacemaker or ICD from home. This monitoring reduces the number of office visits required to check your device to one time per year. It allows [place] to keep an eye on the functioning of your device to ensure it is working properly. You are scheduled for a device check from home on Monday, October 15th, 2018. You may send your transmission at any time that day. If you have a wireless device, the transmission will be sent automatically. After your physician reviews your transmission, you will receive a notification with your next transmission date.  Dr Sallyanne Kuster recommends that you schedule a follow-up appointment in 12 months with a defibrillator check. You will receive a reminder letter in the mail two months in advance. If you don't receive a letter, please call our office to schedule the follow-up appointment.  If you need a refill on your cardiac medications before your next appointment, please call your pharmacy.

## 2017-08-29 NOTE — Progress Notes (Signed)
Patient ID: Brian Craig, male   DOB: 27-Feb-1951, 66 y.o.   MRN: [id number]    Cardiology Office Note    Date:  08/30/2017   ID:  Brian Craig, DOB 09/29/1951, MRN [id number]  PCP:  Mellody Dance, DO  Cardiologist:   Sanda Klein, MD   Chief Complaint  Patient presents with  . Follow-up    no shortness of breath, chest pain, edema, pain or cramping in legs, lightheaded or dizziness    History of Present Illness:  Brian Craig is a 66 y.o. male who presents for permanent atrial fibrillation, nonischemic cardiomyopathy, complete heart block status post AV node ablation, biventricular defibrillator check.  Since his last appointment he has not had major health challenges, but had to undergo repeat right parotid gland surgery for recurrent benign tumor. The patient specifically denies any chest pain at rest exertion, dyspnea at rest or with exertion, orthopnea, paroxysmal nocturnal dyspnea, syncope, palpitations, focal neurological deficits, intermittent claudication, lower extremity edema, unexplained weight gain, cough, hemoptysis or wheezing. She is tolerating anticoagulation with Pradaxa without any bleeding problems. He did not tolerate more potent statins and continues to take simvastatin for lipid lowering.  Device interrogation shows 99.3% biventricular pacing and permanent atrial fibrillation with complete heart block. Lead parameters are excellent. His thoracic impedance (Optivol) is very stable without any signs of hypervolemia. He has not had any shocks from his device and the device has not even recorded any nonsustained VT since January 2017. Estimated generator longevity is about 5 years. Activity level is very constant at about 2 hours per day.  Brian Craig has a history of severe dilated cardiomyopathy related to uncontrolled atrial fibrillation with rapid ventricular response that started in the setting of thyrotoxicosis. His left ventricular ejection fraction was as low as  17%. Cardioversion failed. Eventually underwent AV node ablation and received a biventricular pacemaker-defibrillator in 2007. He underwent a generator change out in 2012 (atrial lead Medtronic 5076, ICD lead Medtronic [phone] Sprint Quattro secure, LV lead Medtronic 4194 Attain bipolar, Medtronic Viva XT gen change May 2016). His left ventricular ejection fraction has improved substantially since AV node ablation. His most recent echocardiogram performed in August 2015 shows normal left ventricular systolic function. He remains in permanent atrial fibrillation.   Past Medical History:  Diagnosis Date  . Atrial fibrillation ([place])   . CHF (congestive heart failure) ([place])   . Graves disease    Graves  . Hyperlipidemia   . Hypertension   . OSA treated with BiPAP   . Substance abuse    alcohol    Past Surgical History:  Procedure Laterality Date  . APPENDECTOMY    . BIV ICD GENERTAOR CHANGE OUT Left 2012   Medtronic Protecta  . CARDIAC ELECTROPHYSIOLOGY STUDY AND ABLATION    . EP IMPLANTABLE DEVICE N/A 05/06/2015   Procedure: ICD Generator Changeout;  Surgeon: Sanda Klein, MD;  Location: [facility];  Service: Cardiovascular;  Laterality: N/A;  . HERNIA REPAIR    . MENISCUS REPAIR Right   . VASECTOMY     unilateral due to injury    Outpatient Medications Prior to Visit  Medication Sig Dispense Refill  . acetaminophen (TYLENOL) 325 MG tablet Take 650 mg by mouth every 6 (six) hours as needed for headache.    . ALPRAZolam (XANAX) 0.5 MG tablet Take 1 tablet (0.5 mg total) by mouth 3 (three) times daily as needed for anxiety. 30 tablet 0  . Cholecalciferol (CVS D3) 2000 units CAPS Take 2,000  Units by mouth daily.    Marland Kitchen levothyroxine (SYNTHROID, LEVOTHROID) 200 MCG tablet Take 200 mcg by mouth daily before breakfast.    . Multiple Vitamin (MULTIVITAMIN) tablet Take 1 tablet by mouth daily.    . sertraline (ZOLOFT) 50 MG tablet Take 1 tablet (50 mg total) by mouth daily. 90 tablet  2  . carvedilol (COREG CR) 80 MG 24 hr capsule TAKE 1 CAPSULE DAILY 90 capsule 3  . furosemide (LASIX) 40 MG tablet TAKE 1 TABLET DAILY 90 tablet 3  . KLOR-CON 10 10 MEQ tablet TAKE 1 TABLET DAILY 90 tablet 3  . lisinopril (PRINIVIL,ZESTRIL) 10 MG tablet Take 10 mg by mouth 2 (two) times daily.    Marland Kitchen PRADAXA 150 MG CAPS capsule TAKE 1 CAPSULE TWICE DAILY 180 capsule 3  . simvastatin (ZOCOR) 40 MG tablet Take 40 mg by mouth daily.     No facility-administered medications prior to visit.      Allergies:   Patient has no known allergies.   Social History   Social History  . Marital status: Married    Spouse name: N/A  . Number of children: N/A  . Years of education: N/A   Social History Main Topics  . Smoking status: Current Every Day Smoker    Packs/day: 0.50    Years: 51.00    Types: Cigarettes  . Smokeless tobacco: Never Used  . Alcohol use No     Comment: recovering alcoholic  . Drug use: No  . Sexual activity: Yes    Birth control/ protection: None   Other Topics Concern  . None   Social History Narrative  . None     Family History:  The patient's family history includes Alcohol abuse in his maternal grandfather and maternal uncle; Heart attack in his father; Heart disease in his father, mother, paternal aunt, and paternal uncle; Hyperlipidemia in his father; Hypertension in his father and mother.   ROS:   Please see the history of present illness.    ROS All other systems reviewed and are negative.   PHYSICAL EXAM:   VS:  BP 119/81   Pulse 80   Ht 6\' 1"  (1.854 m)   Wt (!) 334 lb (151.5 kg)   BMI 44.07 kg/m    GEN: Well nourished, well developed, in no acute distress. Morbid obesity limits the exam  HEENT: normal  Neck: no JVD, carotid bruits, or masses Cardiac: RRR, paradoxically split S2; no murmurs, rubs, or gallops,no edema ; healthy left subclavian ICD site Respiratory:  clear to auscultation bilaterally, normal work of breathing GI: soft, nontender,  nondistended, + BS MS: no deformity or atrophy  Skin: warm and dry, no rash Neuro:  Alert and Oriented x 3, Strength and sensation are intact Psych: euthymic mood, full affect  Wt Readings from Last 3 Encounters:  08/29/17 (!) 334 lb (151.5 kg)  08/25/17 (!) 334 lb (151.5 kg)  07/19/17 (!) 332 lb (150.6 kg)      Studies/Labs Reviewed:   EKG:  EKG is ordered today.  The ekg ordered today demonstrates atrial fibrillation, 100% ventricular paced with positive R wave in lead V1 consistent with effective LV pacing  Recent Labs: 02/09/2017: ALT 13; BUN 9; Creat 1.04; Potassium 4.8; Sodium 141 02/21/2017: Hemoglobin 14.6; Platelets 247 08/25/2017: TSH 3.570   Lipid Panel    Component Value Date/Time   CHOL 114 02/09/2017 1103   TRIG 127 02/09/2017 1103   HDL 32 (L) 02/09/2017 1103   CHOLHDL 3.6 02/09/2017  1103   VLDL 25 02/09/2017 1103   LDLCALC 57 02/09/2017 1103      ASSESSMENT:    1. CHB (complete heart block) (HCC)   2. Permanent atrial fibrillation ([place])   3. Long term current use of anticoagulant   4. Biventricular ICD (implantable cardioverter-defibrillator) in place   5. Chronic diastolic heart failure ([place])   6. OSA on CPAP   7. Morbid obesity ([place])   8. Tobacco abuse   9. Dyslipidemia (high LDL; low HDL)   10. Hypothyroidism, postradioiodine therapy   11. Alcoholism in recovery [facility])       PLAN:  In order of problems listed above:  1. CHB: He has had AV node ablation and is pacemaker dependent. He does not have an escape rhythm. 2. AFib: He is in permanent atrial fibrillation. No history of stroke or other embolic events. CHADSvasc 2 (age, CHF). 3. Pradaxa anticoagulation well-tolerated without bleeding problems. 4. CRT-D: Compressive check today shows normal device function. Remote downloads every 3 months and yearly office visit. 5. CHF: Both his clinical exam and the Optivol trend show euvolemia, he has not required adjustments in diuretic dose, NYHA  functional class I, but rather sedentary. He is on appropriate doses of carvedilol and ACE inhibitor. The lisinopril dose was reduced last year due to complaints of dizziness. For the same reason I don't think he will do well with a transition to [place]. LVEF has improved and he really does not qualify for this medication at this time. 6. OSA on CPAP: Reports 100% compliance with CPAP  7. Obesity: Despite our recommendations he has really not shown any interest in concerted efforts at weight loss. 8. Tobacco abuse: He is a very light smoker and has no desire to quit smoking. 9. Dyslipidemia (high LDL; low HDL): Other than low HDL, excellent lipid profile. Low HDL was not improve without substantial weight loss. 10. Hypothyroidism, postradioiodine therapy: Reports that his TSH was recently normal range. 11. Alcoholism in recovery: Abstinent for several years.   Medication Adjustments/Labs and Tests Ordered: Current medicines are reviewed at length with the patient today.  Concerns regarding medicines are outlined above.  Medication changes, Labs and Tests ordered today are listed in the Patient Instructions below. Patient Instructions  Dr Sallyanne Kuster recommends that you continue on your current medications as directed. Please refer to the Current Medication list given to you today.  Remote monitoring is used to monitor your Pacemaker or ICD from home. This monitoring reduces the number of office visits required to check your device to one time per year. It allows [place] to keep an eye on the functioning of your device to ensure it is working properly. You are scheduled for a device check from home on Monday, October 15th, 2018. You may send your transmission at any time that day. If you have a wireless device, the transmission will be sent automatically. After your physician reviews your transmission, you will receive a notification with your next transmission date.  Dr Sallyanne Kuster recommends that you  schedule a follow-up appointment in 12 months with a defibrillator check. You will receive a reminder letter in the mail two months in advance. If you don't receive a letter, please call our office to schedule the follow-up appointment.  If you need a refill on your cardiac medications before your next appointment, please call your pharmacy.      Signed, Sanda Klein, MD  08/30/2017 6:59 PM    [facility]  [address], [place], [place]  [address] Phone: [phone]; Fax: [phone]

## 2017-08-31 DIAGNOSIS — H05243 Constant exophthalmos, bilateral: Secondary | ICD-10-CM | POA: Diagnosis not present

## 2017-08-31 DIAGNOSIS — E0500 Thyrotoxicosis with diffuse goiter without thyrotoxic crisis or storm: Secondary | ICD-10-CM | POA: Diagnosis not present

## 2017-08-31 DIAGNOSIS — H2513 Age-related nuclear cataract, bilateral: Secondary | ICD-10-CM | POA: Diagnosis not present

## 2017-08-31 DIAGNOSIS — H04123 Dry eye syndrome of bilateral lacrimal glands: Secondary | ICD-10-CM | POA: Diagnosis not present

## 2017-09-12 ENCOUNTER — Telehealth: Payer: Self-pay | Admitting: Cardiovascular Disease

## 2017-09-12 NOTE — Telephone Encounter (Signed)
Closed Encounter  °

## 2017-09-16 ENCOUNTER — Telehealth: Payer: Self-pay | Admitting: *Deleted

## 2017-09-16 NOTE — Telephone Encounter (Signed)
Bipap supply order signed by Dr. Claiborne Billings and returned to [facility]

## 2017-09-26 ENCOUNTER — Ambulatory Visit (INDEPENDENT_AMBULATORY_CARE_PROVIDER_SITE_OTHER): Payer: Medicare Other | Admitting: *Deleted

## 2017-09-26 DIAGNOSIS — I420 Dilated cardiomyopathy: Secondary | ICD-10-CM

## 2017-09-26 NOTE — Progress Notes (Signed)
Remote ICD transmission.   

## 2017-09-28 LAB — CUP PACEART REMOTE DEVICE CHECK
Battery Remaining Longevity: 56 mo
Battery Voltage: 2.98 V
Brady Statistic AP VP Percent: 0 %
Brady Statistic AP VS Percent: 0 %
Brady Statistic AS VS Percent: 0.93 %
Brady Statistic RV Percent Paced: 98.59 %
HIGH POWER IMPEDANCE MEASURED VALUE: 57 Ohm
HighPow Impedance: 43 Ohm
Implantable Lead Implant Date: 20071217
Implantable Lead Implant Date: 20071217
Implantable Lead Location: 753858
Implantable Lead Location: 753859
Implantable Lead Location: 753860
Implantable Lead Model: 5076
Implantable Lead Model: 6947
Implantable Pulse Generator Implant Date: 20160524
Lead Channel Impedance Value: 342 Ohm
Lead Channel Impedance Value: 399 Ohm
Lead Channel Impedance Value: 551 Ohm
Lead Channel Impedance Value: 760 Ohm
Lead Channel Pacing Threshold Pulse Width: 0.8 ms
Lead Channel Setting Pacing Amplitude: 2.5 V
Lead Channel Setting Pacing Pulse Width: 0.8 ms
MDC IDC LEAD IMPLANT DT: 20071217
MDC IDC MSMT LEADCHNL LV IMPEDANCE VALUE: 304 Ohm
MDC IDC MSMT LEADCHNL LV PACING THRESHOLD AMPLITUDE: 1.375 V
MDC IDC MSMT LEADCHNL RA SENSING INTR AMPL: 4.375 mV
MDC IDC MSMT LEADCHNL RA SENSING INTR AMPL: 4.375 mV
MDC IDC MSMT LEADCHNL RV IMPEDANCE VALUE: 285 Ohm
MDC IDC MSMT LEADCHNL RV PACING THRESHOLD AMPLITUDE: 0.75 V
MDC IDC MSMT LEADCHNL RV PACING THRESHOLD PULSEWIDTH: 0.4 ms
MDC IDC SESS DTM: 20181015082405
MDC IDC SET LEADCHNL LV PACING AMPLITUDE: 2.5 V
MDC IDC SET LEADCHNL RV PACING PULSEWIDTH: 0.4 ms
MDC IDC SET LEADCHNL RV SENSING SENSITIVITY: 0.3 mV
MDC IDC STAT BRADY AS VP PERCENT: 99.07 %
MDC IDC STAT BRADY RA PERCENT PACED: 0 %

## 2017-09-30 ENCOUNTER — Encounter: Payer: Self-pay | Admitting: Cardiology

## 2017-10-07 ENCOUNTER — Telehealth: Payer: Self-pay

## 2017-10-07 NOTE — Telephone Encounter (Signed)
Patient called c/o left testicular swelling with very little pain, rated 1/10.  Denies any urinary problems or any other symptoms.  Per Dr. Raliegh Scarlet, advised pt to be seen at the urgent care, however pt states that he would like to wait until next week to be seen in our office.  Stressed to pt that there are certain conditions that can cause swelling that can cause significant damage to the testicle, but he still refused urgent care visit.  Advised pt that if ANYTHING changes, he should proceed to urgent care immediately.  Pt transferred to front desk to schedule appt for next week.  Charyl Bigger, CMA

## 2017-10-09 NOTE — Telephone Encounter (Signed)
Jennet Maduro notified me of pt's complaints around noon on Friday, at start of lunch.  No appt slots available in afternoon and since it was acute testicular swelling and pain, advised to go to the [facility] for eval

## 2017-10-10 ENCOUNTER — Ambulatory Visit (INDEPENDENT_AMBULATORY_CARE_PROVIDER_SITE_OTHER): Payer: Medicare Other | Admitting: Adult Health

## 2017-10-10 ENCOUNTER — Encounter: Payer: Self-pay | Admitting: Adult Health

## 2017-10-10 VITALS — BP 104/70 | HR 85 | Ht 73.0 in | Wt 331.9 lb

## 2017-10-10 DIAGNOSIS — N5089 Other specified disorders of the male genital organs: Secondary | ICD-10-CM | POA: Insufficient documentation

## 2017-10-10 LAB — POCT URINALYSIS DIPSTICK
Bilirubin, UA: NEGATIVE
Blood, UA: NEGATIVE
GLUCOSE UA: NEGATIVE
Ketones, UA: NEGATIVE
LEUKOCYTES UA: NEGATIVE
NITRITE UA: NEGATIVE
Protein, UA: 100
Spec Grav, UA: 1.01 (ref 1.010–1.025)
UROBILINOGEN UA: 0.2 U/dL
pH, UA: 6 (ref 5.0–8.0)

## 2017-10-10 NOTE — Progress Notes (Signed)
Subjective:    Patient ID: Brian Craig, male    DOB: 08/04/1951, 66 y.o.   MRN: [id number]  HPI:  [name] is here with L testicle swelling and tenderness that spontaneously developed Friday, he denies acute injury/trauma prior to onset of sx's.  He denies pain at rest and reports mild "nut pain" rated 2/10 with palpation.  He reports masturbation 2 weeks ago and denies any recent sexual contact with another partner "in quite sometime".  He denies abdominal/flank pain, blood in urine/stool.  He denies discharge from penis or dysuria.  He reports night sweats over the weekend, however denies fever or change in appetite.  He feel on tree branch at age 34 and "my balls swelled to the size of grapefruit and I had to have surgery".  Then at age 20 he developed R hydrocele that required surgical intervention.   He has not been seen by Urologist in decades.  Patient Care Team    Relationship Specialty Notifications Start End  Mina Marble D, NP PCP - General Family Medicine  10/07/17   Brendolyn Patty, MD  Dermatology  02/21/17   Warden Fillers, MD Consulting Physician Ophthalmology  02/21/17   Amalia Greenhouse, MD Referring Physician Endocrinology  02/21/17   Sanda Klein, MD Consulting Physician Cardiology  02/21/17     Patient Active Problem List   Diagnosis Date Noted  . Testicular swelling, left 10/10/2017  . HTN (hypertension) 08/25/2017  . Anxiety 08/25/2017  . Healthcare maintenance 08/25/2017  . Localized swelling, mass or lump of neck 06/01/2017  . Vitamin D deficiency 03/04/2017  . Long term current use of anticoagulant 09/20/2016  . Chronic diastolic heart failure ([place]) 02/02/2016  . History of alcohol dependence ([place]) 02/02/2016  . ICD (implantable cardioverter-defibrillator) battery depletion 04/17/2015  . Congestive dilated cardiomyopathy ([place]) 07/15/2014  . Chronic combined systolic and diastolic CHF, NYHA class 1 ([place]) 07/15/2014  . OSA on CPAP 07/15/2014  . Morbid obesity  ([place]) 07/15/2014  . Permanent atrial fibrillation ([place]) 07/15/2014  . CHB (complete heart block) ([place]) 07/15/2014  . Biventricular ICD  implanted 2007, generator change in 2012 07/15/2014  . Tobacco abuse 07/15/2014  . Alcoholism in recovery ([place]) 07/15/2014  . Hypothyroidism, postradioiodine therapy 07/15/2014  . Dyslipidemia (high LDL; low HDL) 07/15/2014     Past Medical History:  Diagnosis Date  . Atrial fibrillation ([place])   . CHF (congestive heart failure) ([place])   . Graves disease    Graves  . Hyperlipidemia   . Hypertension   . OSA treated with BiPAP   . Substance abuse ([place])    alcohol     Past Surgical History:  Procedure Laterality Date  . APPENDECTOMY    . BIV ICD GENERTAOR CHANGE OUT Left 2012   Medtronic Protecta  . CARDIAC ELECTROPHYSIOLOGY STUDY AND ABLATION    . EP IMPLANTABLE DEVICE N/A 05/06/2015   Procedure: ICD Generator Changeout;  Surgeon: Sanda Klein, MD;  Location: [facility];  Service: Cardiovascular;  Laterality: N/A;  . HERNIA REPAIR    . MENISCUS REPAIR Right   . VASECTOMY     unilateral due to injury     Family History  Problem Relation Age of Onset  . Heart disease Mother   . Hypertension Mother   . Heart disease Father   . Hyperlipidemia Father   . Hypertension Father   . Heart attack Father   . Heart disease Paternal Aunt   . Heart disease Paternal Uncle   . Alcohol  abuse Maternal Uncle   . Alcohol abuse Maternal Grandfather      History  Drug Use No     History  Alcohol Use No    Comment: recovering alcoholic     History  Smoking Status  . Current Every Day Smoker  . Packs/day: 0.50  . Years: 51.00  . Types: Cigarettes  Smokeless Tobacco  . Never Used     Outpatient Encounter Prescriptions as of 10/10/2017  Medication Sig  . acetaminophen (TYLENOL) 325 MG tablet Take 650 mg by mouth every 6 (six) hours as needed for headache.  . ALPRAZolam (XANAX) 0.5 MG tablet Take 1 tablet (0.5 mg total) by  mouth 3 (three) times daily as needed for anxiety.  . carvedilol (COREG CR) 80 MG 24 hr capsule Take 1 capsule (80 mg total) by mouth daily.  . Cholecalciferol (CVS D3) 2000 units CAPS Take 2,000 Units by mouth daily.  . dabigatran (PRADAXA) 150 MG CAPS capsule Take 1 capsule (150 mg total) by mouth 2 (two) times daily.  . furosemide (LASIX) 40 MG tablet Take 1 tablet (40 mg total) by mouth daily.  Marland Kitchen levothyroxine (SYNTHROID, LEVOTHROID) 200 MCG tablet Take 200 mcg by mouth daily before breakfast.  . lisinopril (PRINIVIL,ZESTRIL) 10 MG tablet Take 1 tablet (10 mg total) by mouth 2 (two) times daily.  . Multiple Vitamin (MULTIVITAMIN) tablet Take 1 tablet by mouth daily.  . potassium chloride (KLOR-CON 10) 10 MEQ tablet Take 1 tablet (10 mEq total) by mouth daily.  . sertraline (ZOLOFT) 50 MG tablet Take 1 tablet (50 mg total) by mouth daily.  . simvastatin (ZOCOR) 40 MG tablet Take 1 tablet (40 mg total) by mouth daily.   No facility-administered encounter medications on file as of 10/10/2017.     Allergies: Patient has no known allergies.  Body mass index is 43.79 kg/m.  Blood pressure 104/70, pulse 85, height 6\' 1"  (1.854 m), weight (!) 331 lb 14.4 oz (150.5 kg).    Review of Systems  Constitutional: Positive for chills and fatigue. Negative for activity change, appetite change, diaphoresis, fever and unexpected weight change.  Eyes: Negative for visual disturbance.  Respiratory: Positive for cough. Negative for chest tightness, shortness of breath, wheezing and stridor.   Cardiovascular: Negative for chest pain, palpitations and leg swelling.  Gastrointestinal: Negative for abdominal distention, abdominal pain, blood in stool, constipation, diarrhea, nausea and vomiting.  Endocrine: Negative for cold intolerance, heat intolerance, polydipsia and polyphagia.  Genitourinary: Positive for scrotal swelling and testicular pain. Negative for decreased urine volume, difficulty urinating,  discharge, dysuria, flank pain, frequency, hematuria, penile pain, penile swelling and urgency.       Objective:   Physical Exam  Constitutional: He is oriented to person, place, and time. He appears well-developed and well-nourished. No distress.  Cardiovascular: Normal rate, regular rhythm, normal heart sounds and intact distal pulses.   Pulmonary/Chest: Effort normal and breath sounds normal. No respiratory distress. He has no wheezes. He has no rales. He exhibits no tenderness.  Abdominal: Soft. Bowel sounds are normal. He exhibits no distension and no mass. There is no tenderness. There is no rebound and no guarding. Hernia confirmed negative in the right inguinal area and confirmed negative in the left inguinal area.  Genitourinary: Right testis shows no mass, no swelling and no tenderness. Left testis shows swelling and tenderness. Left testis shows no mass. No penile erythema or penile tenderness. No discharge found.  Genitourinary Comments: Chaperone present during examination.  Neurological: He is  alert and oriented to person, place, and time.  Skin: He is not diaphoretic.          Assessment & Plan:   1. Testicular swelling, left     Testicular swelling, left UA-negative Scrotal US and Urology referral placed. Aftercare instructions provided. Call clinic with any questions/concerns.     FOLLOW-UP:  Return if symptoms worsen or fail to improve.

## 2017-10-10 NOTE — Assessment & Plan Note (Addendum)
UA-negative Scrotal US and Urology referral placed. Aftercare instructions provided. Call clinic with any questions/concerns.

## 2017-10-10 NOTE — Patient Instructions (Addendum)
Scrotal Swelling Scrotal swelling may occur on one or both sides of the scrotum. Pain may also occur with swelling. Possible causes of scrotal swelling include:  Injury.  Infection.  An ingrown hair or abrasion in the area.  Repeated rubbing from tight-fitting underwear.  Poor hygiene.  A weakened area in the muscles around the groin (hernia). A hernia can allow abdominal contents to push into the scrotum.  Fluid around the testicle (hydrocele).  Enlarged vein around the testicle (varicocele).  Certain medical treatments or existing conditions.  A recent genital surgery or procedure.  The spermatic cord becomes twisted in the scrotum, which cuts off blood supply (testicular torsion).  Testicular cancer.  Follow these instructions at home: Once the cause of your scrotal swelling has been determined, you may be asked to monitor your scrotum for any changes. The following actions may help to alleviate any discomfort you are experiencing:  Rest and limit activity until the swelling goes away. Lying down is the preferred position.  Put ice on the scrotum: ? Put ice in a plastic bag. ? Place a towel between your skin and the bag. ? Leave the ice on for 20 minutes, 2-3 times a day for 1-2 days.  Place a rolled towel under the testicles for support.  Wear loose-fitting clothing or an athletic support cup for comfort.  Take all medicines as directed by your health care provider.  Perform a monthly self-exam of the scrotum and penis. Feel for changes. Ask your health care provider how to perform a monthly self-exam if you are unsure.  Contact a health care provider if:  You have a sudden (acute) onset of pain that is persistent and not improving.  You notice a heavy feeling or fluid in the scrotum.  You have pain or burning while urinating.  You have blood in the urine or semen.  You feel a lump around the testicle.  You notice that one testicle is larger than the other  (slight variation is normal).  You have a persistent dull ache or pain in the groin or scrotum. Get help right away if:  The pain does not go away or becomes severe.  You have a fever or shaking chills.  You have pain or vomiting that cannot be controlled.  You notice significant redness or swelling of one or both sides of the scrotum.  You experience redness spreading upward from your scrotum to your abdomen or downward from your scrotum to your thighs. This information is not intended to replace advice given to you by your health care provider. Make sure you discuss any questions you have with your health care provider. Document Released: 01/01/2011 Document Revised: 06/18/2016 Document Reviewed: 05/03/2013 Elsevier Interactive Patient Education  2018 [place] for scrotal US and Urology referral placed. Please call clinic with any questions/concerns. FEEL BETTER!

## 2017-11-07 DIAGNOSIS — N509 Disorder of male genital organs, unspecified: Secondary | ICD-10-CM | POA: Diagnosis not present

## 2017-11-07 DIAGNOSIS — N453 Epididymo-orchitis: Secondary | ICD-10-CM | POA: Diagnosis not present

## 2017-11-09 ENCOUNTER — Encounter: Payer: Self-pay | Admitting: Cardiovascular Disease

## 2017-11-09 ENCOUNTER — Ambulatory Visit (INDEPENDENT_AMBULATORY_CARE_PROVIDER_SITE_OTHER): Payer: Medicare Other | Admitting: Cardiovascular Disease

## 2017-11-09 VITALS — BP 106/74 | HR 78 | Ht 73.0 in | Wt 331.0 lb

## 2017-11-09 DIAGNOSIS — I5032 Chronic diastolic (congestive) heart failure: Secondary | ICD-10-CM | POA: Diagnosis not present

## 2017-11-09 DIAGNOSIS — I482 Chronic atrial fibrillation: Secondary | ICD-10-CM | POA: Diagnosis not present

## 2017-11-09 DIAGNOSIS — G4733 Obstructive sleep apnea (adult) (pediatric): Secondary | ICD-10-CM

## 2017-11-09 DIAGNOSIS — Z95810 Presence of automatic (implantable) cardiac defibrillator: Secondary | ICD-10-CM

## 2017-11-09 DIAGNOSIS — I4821 Permanent atrial fibrillation: Secondary | ICD-10-CM

## 2017-11-09 NOTE — Patient Instructions (Signed)
Your physician wants you to follow-up in: 12 months with Dr. Kelly (sleep clinic).  You will receive a reminder letter in the mail two months in advance. If you don't receive a letter, please call our office to schedule the follow-up appointment.  

## 2017-11-10 ENCOUNTER — Encounter: Payer: Self-pay | Admitting: Cardiovascular Disease

## 2017-11-10 NOTE — Progress Notes (Signed)
Cardiology Office Note    Date:  11/10/2017   ID:  Brian Craig, DOB 1951-03-12, MRN [id number]  PCP:  Brian Grandchild, NP  Cardiologist:  Brian Majestic, MD (sleep); Brian Craig  No chief complaint on file.   History of Present Illness:  Brian Craig is a 66 y.o. male is followed by Brian Craig for cardiology disease.  He has a history of severe sleep apnea.  He was now referred by Brian Craig and Brian Craig for reassessment of his sleep apnea and for a new machine. Marland Kitchen  He presents to sleep clinic today in follow-up of his new machine for further evaluation and treatment.  Brian Craig has a history of prior severe cardiomyopathy related to uncontrolled atrial fibrillation with rapid ventricular response that started in the setting of thyrotoxicosis.  His left ventricle ejection fraction had been as low as 17%.  He on underwent AV node ablation and received a biventricular pacemaker/defibrillator in 2007.  In 2007.  Nocturia was also evaluated in [place], [place] for sleep apnea, which was felt to be severe.  Although I do not have these records, I suspect he may of had significant central apneic events in the setting of his LV dysfunction such that since 2007.  He has been using a ResMed VPAP adapt SV unit.  He admits to 100% compliance and cannot sleep without it.  Recently, his 66 year old adapt servoventilation unit has begun to malfunction.  He has wire fragments and has been working intermittently.  He uses a fullface mask.  He typically goes to bed at midnight and wakes up at 8 AM.  Previously, he had awakened frequently with nocturia, but with treatment.  He only wakes up 1 time per night for urination.  Since 2007, he admits to a 30 pound weight gain.  His LV function has improved and reportedly had increased to a proximally 50%, but he has not had a recent echo.  With his recent machine malfunction, he now presents for sleep evaluation in order to obtain a new unit for treatment of his  obstructive sleep apnea.  He has been under the [employer] which is is in [place].  He had not established with the DME company locally despite living in [place]  since 2015.  Typically he is in bed by midnight and wakes up at 8 AM.  He admits to 100% compliance.  When I initially saw him in May 2018 an Epworth scale was calculated in the office as shown below:  Epworth Sleepiness Scale: Situation   Chance of Dozing/Sleeping (0 = never , 1 = slight chance , 2 = moderate chance , 3 = high chance )   sitting and reading 0   watching TV 1   sitting inactive in a public place 0   being a passenger in a motor vehicle for an hour or more 0   lying down in the afternoon 3   sitting and talking to someone 0   sitting quietly after lunch (no alcohol) 0   while stopped for a few minutes in traffic as the driver 0   Total Score  4   He underwent a follow-up sleep study on 05/12/2017.  He had severe obstructive sleep apnea with an HI 57.7 per hour and RDI of 71.9 per hour.  Events were worse with supine posture with an HI of 71.2 per hour.  He had reduced sleep efficiency of 49.4% and as result, was unable to do a split-night  protocol.  Oxygen desaturated to 88%.  There was moderate snoring.  On 07/19/2017.  He underwent his CPAP titration [place] and ultimately required BiPAP therapy at 16/12.  He had significant PLMS index of 112.  Since initiating BiPAP therapy.  He has felt well.  A download from 10/10/2017 through 11/08/2017, was performed.  This shows an excellent AHI 1.3.  There were only several days where he did not use this when he went to [facility] and brought his old machine with him and use CPAP with his old machine.  He therefore he has used CPAP 100% of the time.  He is averaging 8 hours and 31 minutes.  AHI is excellent at 1.3 per hour.  There was no significant leak until the last several days when he has realized that there is more oill on his cushion , which needs cleaning and/or  replacement.  An Epworth Sleepiness Scale score was recalculated in the office today and this endorsed at 1 arguing against residual daytime sleepiness.   Past Medical History:  Diagnosis Date  . Atrial fibrillation ([place])   . CHF (congestive heart failure) ([place])   . Graves disease    Graves  . Hyperlipidemia   . Hypertension   . OSA treated with BiPAP   . Substance abuse ([place])    alcohol    Past Surgical History:  Procedure Laterality Date  . APPENDECTOMY    . BIV ICD GENERTAOR CHANGE OUT Left 2012   Medtronic Protecta  . CARDIAC ELECTROPHYSIOLOGY STUDY AND ABLATION    . EP IMPLANTABLE DEVICE N/A 05/06/2015   Procedure: ICD Generator Changeout;  Surgeon: Brian Klein, MD;  Location: [facility];  Service: Cardiovascular;  Laterality: N/A;  . HERNIA REPAIR    . MENISCUS REPAIR Right   . VASECTOMY     unilateral due to injury    Current Medications: Outpatient Medications Prior to Visit  Medication Sig Dispense Refill  . acetaminophen (TYLENOL) 325 MG tablet Take 650 mg by mouth every 6 (six) hours as needed for headache.    . ALPRAZolam (XANAX) 0.5 MG tablet Take 1 tablet (0.5 mg total) by mouth 3 (three) times daily as needed for anxiety. 30 tablet 0  . amoxicillin-clavulanate (AUGMENTIN) 875-125 MG tablet Take 1 tablet by mouth 2 (two) times daily.    . carvedilol (COREG CR) 80 MG 24 hr capsule Take 1 capsule (80 mg total) by mouth daily. 90 capsule 3  . Cholecalciferol (CVS D3) 2000 units CAPS Take 2,000 Units by mouth daily.    . dabigatran (PRADAXA) 150 MG CAPS capsule Take 1 capsule (150 mg total) by mouth 2 (two) times daily. 180 capsule 3  . furosemide (LASIX) 40 MG tablet Take 1 tablet (40 mg total) by mouth daily. 90 tablet 3  . levothyroxine (SYNTHROID, LEVOTHROID) 200 MCG tablet Take 200 mcg by mouth daily before breakfast.    . lisinopril (PRINIVIL,ZESTRIL) 10 MG tablet Take 1 tablet (10 mg total) by mouth 2 (two) times daily. 180 tablet 3  . meloxicam  (MOBIC) 15 MG tablet Take 1 tablet by mouth 2 (two) times daily.    . Multiple Vitamin (MULTIVITAMIN) tablet Take 1 tablet by mouth daily.    . potassium chloride (KLOR-CON 10) 10 MEQ tablet Take 1 tablet (10 mEq total) by mouth daily. 90 tablet 3  . sertraline (ZOLOFT) 50 MG tablet Take 1 tablet (50 mg total) by mouth daily. 90 tablet 2  . simvastatin (ZOCOR) 40 MG tablet Take 1  tablet (40 mg total) by mouth daily. 90 tablet 3  . tamsulosin (FLOMAX) 0.4 MG CAPS capsule Take 1 capsule by mouth daily.     No facility-administered medications prior to visit.      Allergies:   Patient has no known allergies.   Social History   Socioeconomic History  . Marital status: Married    Spouse name: None  . Number of children: None  . Years of education: None  . Highest education level: None  Social Needs  . Financial resource strain: None  . Food insecurity - worry: None  . Food insecurity - inability: None  . Transportation needs - medical: None  . Transportation needs - non-medical: None  Occupational History  . None  Tobacco Use  . Smoking status: Current Every Day Smoker    Packs/day: 0.50    Years: 51.00    Pack years: 25.50    Types: Cigarettes  . Smokeless tobacco: Never Used  Substance and Sexual Activity  . Alcohol use: No    Comment: recovering alcoholic  . Drug use: No  . Sexual activity: Yes    Birth control/protection: None  Other Topics Concern  . None  Social History Narrative  . None   Additional social history is notable in that he is a retired Chief Financial Officer at [employer].    Family History:  The patient's family history includes Alcohol abuse in his maternal grandfather and maternal uncle; Heart attack in his father; Heart disease in his father, mother, paternal aunt, and paternal uncle; Hyperlipidemia in his father; Hypertension in his father and mother.   ROS General: Negative; No fevers, chills, or night sweats;  HEENT: Negative; No changes in vision or hearing,  sinus congestion, difficulty swallowing Pulmonary: Negative; No cough, wheezing, shortness of breath, hemoptysis Cardiovascular: Negative; No chest pain, presyncope, syncope, palpitations; history of PAF with remote severe LV dysfunction, which did improve following ablation, and bi pacing GI: Negative; No nausea, vomiting, diarrhea, or abdominal pain GU: Negative; No dysuria, hematuria, or difficulty voiding Musculoskeletal: Negative; no myalgias, joint pain, or weakness Hematologic/Oncology: Negative; no easy bruising, bleeding Endocrine: Negative; no heat/cold intolerance; no diabetes Neuro: Negative; no changes in balance, headaches Skin: Negative; No rashes or skin lesions Psychiatric: Negative; No behavioral problems, depression Sleep: Previously on VPAP adapt several ventilation since 2007; admits to 100% compliance.  No residual snoring or daytime sleepiness, hypersomnolence, bruxism, restless legs, hypnogognic hallucinations, no cataplexy; now currently on BiPAP Other comprehensive 14 point system review is negative.   PHYSICAL EXAM:   VS:  BP 106/74   Pulse 78   Ht 6' 1"  (1.854 m)   Wt (!) 331 lb (150.1 kg)   SpO2 97%   BMI 43.67 kg/m    Repeat blood pressure by me was excellent at 112/78  Wt Readings from Last 3 Encounters:  11/09/17 (!) 331 lb (150.1 kg)  10/10/17 (!) 331 lb 14.4 oz (150.5 kg)  08/29/17 (!) 334 lb (151.5 kg)    General: Alert, oriented, no distress.  Skin: normal turgor, no rashes, warm and dry HEENT: Normocephalic, atraumatic. Pupils equal round and reactive to light; sclera anicteric; extraocular muscles intact;  Nose without nasal septal hypertrophy Mouth/Parynx benign; Mallinpatti scale 3 Neck: No JVD, no carotid bruits; normal carotid upstroke Lungs: clear to ausculatation and percussion; no wheezing or rales Chest wall: without tenderness to palpitation Heart: PMI not displaced,RRR, s1 s2 normal, 1/6 systolic murmur, no diastolic murmur, no  rubs, gallops, thrills, or heaves Abdomen: soft, nontender;  no hepatosplenomehaly, BS+; abdominal aorta nontender and not dilated by palpation. Back: no CVA tenderness Pulses 2+ Musculoskeletal: full range of motion, normal strength, no joint deformities Extremities: no clubbing cyanosis or edema, Homan's sign negative  Neurologic: grossly nonfocal; Cranial nerves grossly wnl Psychologic: Normal mood and affect   Studies/Labs Reviewed:    EKG:  EKG is not ordered today.    May 2018 ECG (independently read by me): Ventricular paced rhythm with biventricular pacing.  Ventricular rate 75 bpm.  Recent Labs: BMP Latest Ref Rng & Units 02/09/2017 09/20/2016 04/30/2015  Glucose 65 - 99 mg/dL 106(H) 99 102(H)  BUN 7 - 25 mg/dL 9 11 8   Creatinine 0.70 - 1.25 mg/dL 1.04 1.06 0.69  Sodium 135 - 146 mmol/L 141 139 139  Potassium 3.5 - 5.3 mmol/L 4.8 4.6 4.3  Chloride 98 - 110 mmol/L 101 102 103  CO2 20 - 31 mmol/L 30 28 27   Calcium 8.6 - 10.3 mg/dL 9.3 9.3 9.3     Hepatic Function Latest Ref Rng & Units 02/09/2017 09/20/2016 04/30/2015  Total Protein 6.1 - 8.1 g/dL 6.6 6.7 6.7  Albumin 3.6 - 5.1 g/dL 4.0 4.0 4.0  AST 10 - 35 U/L 15 16 15   ALT 9 - 46 U/L 13 15 15   Alk Phosphatase 40 - 115 U/L 66 68 63  Total Bilirubin 0.2 - 1.2 mg/dL 0.6 0.7 0.7    CBC Latest Ref Rng & Units 02/21/2017 09/20/2016 04/30/2015  WBC 3.4 - 10.8 x10E3/uL 8.6 9.8 8.2  Hemoglobin 13.0 - 17.7 g/dL 14.6 15.2 15.1  Hematocrit 37.5 - 51.0 % 43.4 45.6 43.3  Platelets 150 - 379 x10E3/uL 247 255 230   Lab Results  Component Value Date   MCV 96 02/21/2017   MCV 96.0 09/20/2016   MCV 93.1 04/30/2015   Lab Results  Component Value Date   TSH 3.570 08/25/2017   Lab Results  Component Value Date   HGBA1C 5.5 02/21/2017     BNP No results found for: BNP  ProBNP No results found for: PROBNP   Lipid Panel     Component Value Date/Time   CHOL 114 02/09/2017 1103   TRIG 127 02/09/2017 1103   HDL 32 (L)  02/09/2017 1103   CHOLHDL 3.6 02/09/2017 1103   VLDL 25 02/09/2017 1103   LDLCALC 57 02/09/2017 1103     RADIOLOGY: No results found.   Additional studies/ records that were reviewed today include:  I reviewed the patient's records from Brian Craig and Dr. Darrall Dears.  I will try to obtain records of his initial sleep study done in [place], [place].   ASSESSMENT:    1. OSA (obstructive sleep apnea)   2. Biventricular ICD (implantable cardioverter-defibrillator) in place   3. Morbid obesity ([place])   4. Chronic diastolic heart failure ([place])   5. Permanent atrial fibrillation [facility])      PLAN:  Brian Craig is a 66 year old gentleman who has a history of morbid obesity and previously documented severe cardiomyopathy with an EF as low as 17% in 2007.  At that time he also had undergone a sleep study and was told of having very severe sleep apnea.  I suspect he may have had complex sleep apnea with central events and even possibly Cheyne-Stokes respirations for which he was started on adapt Servo ventilation therapy.  He subsequently has undergone ablation as well as insertion of a biventricular pacemaker and this has resulted in almost normalization of LV function.  "I initially saw  him, I felt he may no may no longer require ASV mode of therapy.  He underwent reevaluation and again was found to have severe obstructive sleep apnea with an overall AHI at 57.7 per hour, RDI 71.9 per hour and AHI while supine at 71.2 per hour.  I reviewed his BiPAP titration study with him in detail.  He is now doing exceptionally well on BiPAP at 16/12 with an excellent AHI at 1.3 per hour.  He is 100% compliant with CPAP use.  We discussed the importance of making sure his mask cushion is cleaned on a daily basis since over the past week a CVA.  Leak was present.  He notes significant improvement in his current CPAP unit.  Compared to his old treatment.  He is sleeping excellent duration at 8 hours and 31  minutes.  He uses SoClean for daily cleansing. .  We discussed the importance of weight loss and exercise.  He is morbidly obese with a BMI of 43.67.  He'll return to the cardiology care of Brian Craig, and I will see him in one year for sleep clinic follow-up evaluation.     Medication Adjustments/Labs and Tests Ordered: Current medicines are reviewed at length with the patient today.  Concerns regarding medicines are outlined above.  Medication changes, Labs and Tests ordered today are listed in the Patient Instructions below. Patient Instructions  Your physician wants you to follow-up in: 12 months with Dr. Claiborne Billings (sleep clinic) You will receive a reminder letter in the mail two months in advance. If you don't receive a letter, please call our office to schedule the follow-up appointment.     Signed, Brian Majestic, MD  11/10/2017 10:16 PM    [facility] [address], [place], [place], [place]  [address] Phone: [phone]

## 2017-11-18 ENCOUNTER — Other Ambulatory Visit: Payer: Self-pay | Admitting: Adult Health

## 2017-11-29 LAB — CUP PACEART INCLINIC DEVICE CHECK
Battery Voltage: 2.99 V
Brady Statistic AP VP Percent: 0 %
Brady Statistic AP VS Percent: 0 %
Brady Statistic AS VP Percent: 99.33 %
Brady Statistic AS VS Percent: 0.67 %
Brady Statistic RV Percent Paced: 98.98 %
Date Time Interrogation Session: 20180917193103
HIGH POWER IMPEDANCE MEASURED VALUE: 43 Ohm
HIGH POWER IMPEDANCE MEASURED VALUE: 56 Ohm
Implantable Lead Implant Date: 20071217
Implantable Lead Implant Date: 20071217
Implantable Lead Location: 753860
Implantable Lead Model: 4194
Implantable Lead Model: 5076
Implantable Lead Model: 6947
Lead Channel Impedance Value: 304 Ohm
Lead Channel Impedance Value: 342 Ohm
Lead Channel Impedance Value: 779 Ohm
Lead Channel Pacing Threshold Amplitude: 0.875 V
Lead Channel Pacing Threshold Pulse Width: 0.4 ms
Lead Channel Pacing Threshold Pulse Width: 0.8 ms
Lead Channel Sensing Intrinsic Amplitude: 4.375 mV
Lead Channel Sensing Intrinsic Amplitude: 4.375 mV
Lead Channel Setting Pacing Amplitude: 2.5 V
Lead Channel Setting Pacing Pulse Width: 0.4 ms
MDC IDC LEAD IMPLANT DT: 20071217
MDC IDC LEAD LOCATION: 753858
MDC IDC LEAD LOCATION: 753859
MDC IDC MSMT BATTERY REMAINING LONGEVITY: 61 mo
MDC IDC MSMT LEADCHNL LV IMPEDANCE VALUE: 608 Ohm
MDC IDC MSMT LEADCHNL RA IMPEDANCE VALUE: 399 Ohm
MDC IDC MSMT LEADCHNL RV IMPEDANCE VALUE: 285 Ohm
MDC IDC MSMT LEADCHNL RV PACING THRESHOLD AMPLITUDE: 0.75 V
MDC IDC PG IMPLANT DT: 20160524
MDC IDC SET LEADCHNL LV PACING AMPLITUDE: 2 V
MDC IDC SET LEADCHNL LV PACING PULSEWIDTH: 0.8 ms
MDC IDC SET LEADCHNL RV SENSING SENSITIVITY: 0.3 mV
MDC IDC STAT BRADY RA PERCENT PACED: 0 %

## 2017-12-08 ENCOUNTER — Other Ambulatory Visit: Payer: Self-pay | Admitting: Cardiovascular Disease

## 2017-12-26 ENCOUNTER — Ambulatory Visit (INDEPENDENT_AMBULATORY_CARE_PROVIDER_SITE_OTHER): Payer: Medicare Other | Admitting: *Deleted

## 2017-12-26 DIAGNOSIS — I420 Dilated cardiomyopathy: Secondary | ICD-10-CM

## 2017-12-28 LAB — CUP PACEART REMOTE DEVICE CHECK
Brady Statistic AP VP Percent: 0 %
Brady Statistic AS VP Percent: 99.19 %
Brady Statistic AS VS Percent: 0.81 %
Brady Statistic RV Percent Paced: 98.7 %
HIGH POWER IMPEDANCE MEASURED VALUE: 41 Ohm
HIGH POWER IMPEDANCE MEASURED VALUE: 51 Ohm
Implantable Lead Implant Date: 20071217
Implantable Lead Location: 753860
Implantable Lead Model: 5076
Implantable Lead Model: 6947
Lead Channel Impedance Value: 247 Ohm
Lead Channel Impedance Value: 342 Ohm
Lead Channel Impedance Value: 399 Ohm
Lead Channel Impedance Value: 513 Ohm
Lead Channel Impedance Value: 665 Ohm
Lead Channel Pacing Threshold Amplitude: 0.75 V
Lead Channel Pacing Threshold Pulse Width: 0.8 ms
Lead Channel Sensing Intrinsic Amplitude: 3.875 mV
Lead Channel Sensing Intrinsic Amplitude: 3.875 mV
Lead Channel Setting Pacing Amplitude: 2.5 V
Lead Channel Setting Sensing Sensitivity: 0.3 mV
MDC IDC LEAD IMPLANT DT: 20071217
MDC IDC LEAD IMPLANT DT: 20071217
MDC IDC LEAD LOCATION: 753858
MDC IDC LEAD LOCATION: 753859
MDC IDC MSMT BATTERY REMAINING LONGEVITY: 54 mo
MDC IDC MSMT BATTERY VOLTAGE: 2.98 V
MDC IDC MSMT LEADCHNL RV IMPEDANCE VALUE: 304 Ohm
MDC IDC MSMT LEADCHNL RV PACING THRESHOLD AMPLITUDE: 0.75 V
MDC IDC MSMT LEADCHNL RV PACING THRESHOLD PULSEWIDTH: 0.4 ms
MDC IDC PG IMPLANT DT: 20160524
MDC IDC SESS DTM: 20190114083424
MDC IDC SET LEADCHNL LV PACING AMPLITUDE: 2.25 V
MDC IDC SET LEADCHNL LV PACING PULSEWIDTH: 0.8 ms
MDC IDC SET LEADCHNL RV PACING PULSEWIDTH: 0.4 ms
MDC IDC STAT BRADY AP VS PERCENT: 0 %
MDC IDC STAT BRADY RA PERCENT PACED: 0 %

## 2017-12-28 NOTE — Progress Notes (Signed)
Remote ICD transmission.   

## 2017-12-30 ENCOUNTER — Encounter: Payer: Self-pay | Admitting: Cardiology

## 2018-02-20 ENCOUNTER — Other Ambulatory Visit: Payer: Self-pay

## 2018-02-20 DIAGNOSIS — R739 Hyperglycemia, unspecified: Secondary | ICD-10-CM

## 2018-02-20 DIAGNOSIS — R7309 Other abnormal glucose: Secondary | ICD-10-CM

## 2018-02-20 DIAGNOSIS — E890 Postprocedural hypothyroidism: Secondary | ICD-10-CM

## 2018-02-20 DIAGNOSIS — E785 Hyperlipidemia, unspecified: Secondary | ICD-10-CM

## 2018-02-20 DIAGNOSIS — I10 Essential (primary) hypertension: Secondary | ICD-10-CM

## 2018-02-20 DIAGNOSIS — Z7901 Long term (current) use of anticoagulants: Secondary | ICD-10-CM

## 2018-02-20 DIAGNOSIS — Z0000 Encounter for general adult medical examination without abnormal findings: Secondary | ICD-10-CM

## 2018-02-20 DIAGNOSIS — E559 Vitamin D deficiency, unspecified: Secondary | ICD-10-CM

## 2018-02-20 NOTE — Progress Notes (Signed)
Subjective:    Patient ID: Brian Craig, male    DOB: 02-Dec-1951, 67 y.o.   MRN: [id number]  HPI:  08/25/18 OV: Brian Craig is here for regular f/u:  HTN, depression, anxiety.  He is compliant on all medications and denies SE.  He denies CP/dyspnea/palpitations/sig cough.  He continues to smoke 1/2pack and day and again declines smoking cessation.  He drinks 1.5 gallons of decaf/artifically sweetened tea and maybe 1-2 bottles of water.  He follows a "decent diet" and denies formal exercise, however performs house/yard work daily.  He continues to abstain from ETOH, practice AA and will hit 30 years of sobriety 2019.  He was seen by ENT and bx of Parotid gland negative for ca Sept 2018.  02/22/18 OV: Brian Craig presents for CPE He has been following low CHO diet and walking > 73mins/day- he has lost 23 lbs since last OV! He continues to smoke, 1/2 pack/day, he has been using tobacco >50 years  He reports medication compliance, denies SE He reports using PRN Alprazolam "maybe once every other week". He continues to abstain from [facility]! He is followed by cards every 6 months  Healthcare Maintenance: Colonoscopy- records requested, provided with stool cards AAA screening- ordered LDCT- ordered  Depression screen [facility] 2/9 02/22/2018  Decreased Interest 0  Down, Depressed, Hopeless 0  PHQ - 2 Score 0  Altered sleeping 0  Tired, decreased energy 0  Change in appetite 0  Feeling bad or failure about yourself  0  Trouble concentrating 0  Moving slowly or fidgety/restless 0  Suicidal thoughts 0  PHQ-9 Score 0  Difficult doing work/chores -    Patient Care Team    Relationship Specialty Notifications Start End  [place], Valetta Fuller D, NP PCP - General Family Medicine  10/07/17   Brendolyn Patty, MD  Dermatology  02/21/17   Warden Fillers, MD Consulting Physician Ophthalmology  02/21/17   Amalia Greenhouse, MD Referring Physician Endocrinology  02/21/17   Sanda Klein, MD Consulting Physician  Cardiology  02/21/17     Patient Active Problem List   Diagnosis Date Noted  . Hyperlipidemia 02/22/2018  . Testicular swelling, left 10/10/2017  . HTN (hypertension) 08/25/2017  . Anxiety 08/25/2017  . Healthcare maintenance 08/25/2017  . Localized swelling, mass or lump of neck 06/01/2017  . Vitamin D deficiency 03/04/2017  . Long term current use of anticoagulant 09/20/2016  . Chronic diastolic heart failure ([place]) 02/02/2016  . History of alcohol dependence ([place]) 02/02/2016  . ICD (implantable cardioverter-defibrillator) battery depletion 04/17/2015  . Congestive dilated cardiomyopathy ([place]) 07/15/2014  . Chronic combined systolic and diastolic CHF, NYHA class 1 ([place]) 07/15/2014  . OSA on CPAP 07/15/2014  . Morbid obesity ([place]) 07/15/2014  . Permanent atrial fibrillation ([place]) 07/15/2014  . CHB (complete heart block) ([place]) 07/15/2014  . Biventricular ICD  implanted 2007, generator change in 2012 07/15/2014  . Tobacco abuse 07/15/2014  . Alcoholism in recovery ([place]) 07/15/2014  . Hypothyroidism, postradioiodine therapy 07/15/2014  . Dyslipidemia (high LDL; low HDL) 07/15/2014     Past Medical History:  Diagnosis Date  . Atrial fibrillation ([place])   . CHF (congestive heart failure) ([place])   . Graves disease    Graves  . Hyperlipidemia   . Hypertension   . OSA treated with BiPAP   . Substance abuse ([place])    alcohol     Past Surgical History:  Procedure Laterality Date  . APPENDECTOMY    . BIV ICD GENERTAOR CHANGE OUT Left 2012  Medtronic Protecta  . CARDIAC ELECTROPHYSIOLOGY STUDY AND ABLATION    . EP IMPLANTABLE DEVICE N/A 05/06/2015   Procedure: ICD Generator Changeout;  Surgeon: Sanda Klein, MD;  Location: [facility];  Service: Cardiovascular;  Laterality: N/A;  . HERNIA REPAIR    . MENISCUS REPAIR Right   . VASECTOMY     unilateral due to injury     Family History  Problem Relation Age of Onset  . Heart disease Mother   . Hypertension Mother    . Heart disease Father   . Hyperlipidemia Father   . Hypertension Father   . Heart attack Father   . Heart disease Paternal Aunt   . Heart disease Paternal Uncle   . Alcohol abuse Maternal Uncle   . Alcohol abuse Maternal Grandfather      Social History   Substance and Sexual Activity  Drug Use No     Social History   Substance and Sexual Activity  Alcohol Use No   Comment: recovering alcoholic     Social History   Tobacco Use  Smoking Status Current Every Day Smoker  . Packs/day: 0.50  . Years: 51.00  . Pack years: 25.50  . Types: Cigarettes  Smokeless Tobacco Never Used     Outpatient Encounter Medications as of 02/22/2018  Medication Sig  . acetaminophen (TYLENOL) 325 MG tablet Take 650 mg by mouth every 6 (six) hours as needed for headache.  . ALPRAZolam (XANAX) 0.5 MG tablet Take 1 tablet (0.5 mg total) by mouth 3 (three) times daily as needed for anxiety.  . carvedilol (COREG CR) 80 MG 24 hr capsule Take 1 capsule (80 mg total) by mouth daily.  . Cholecalciferol (CVS D3) 2000 units CAPS Take 2,000 Units by mouth daily.  . dabigatran (PRADAXA) 150 MG CAPS capsule Take 1 capsule (150 mg total) by mouth 2 (two) times daily.  . furosemide (LASIX) 40 MG tablet Take 1 tablet (40 mg total) by mouth daily.  Marland Kitchen levothyroxine (SYNTHROID, LEVOTHROID) 200 MCG tablet Take 200 mcg by mouth daily before breakfast.  . lisinopril (PRINIVIL,ZESTRIL) 10 MG tablet Take 1 tablet (10 mg total) by mouth 2 (two) times daily.  . Multiple Vitamin (MULTIVITAMIN) tablet Take 1 tablet by mouth daily.  . potassium chloride (KLOR-CON 10) 10 MEQ tablet Take 1 tablet (10 mEq total) by mouth daily.  . sertraline (ZOLOFT) 50 MG tablet TAKE 1 TABLET DAILY  . simvastatin (ZOCOR) 40 MG tablet Take 1 tablet (40 mg total) by mouth daily.  . [DISCONTINUED] ALPRAZolam (XANAX) 0.5 MG tablet Take 1 tablet (0.5 mg total) by mouth 3 (three) times daily as needed for anxiety.  . [DISCONTINUED]  amoxicillin-clavulanate (AUGMENTIN) 875-125 MG tablet Take 1 tablet by mouth 2 (two) times daily.  . [DISCONTINUED] meloxicam (MOBIC) 15 MG tablet Take 1 tablet by mouth 2 (two) times daily.  . [DISCONTINUED] tamsulosin (FLOMAX) 0.4 MG CAPS capsule Take 1 capsule by mouth daily.   No facility-administered encounter medications on file as of 02/22/2018.     Allergies: Patient has no known allergies.  Body mass index is 40.64 kg/m.  Blood pressure 93/60, pulse 71, height 6\' 1"  (1.854 m), weight (!) 308 lb (139.7 kg), SpO2 96 %.   Review of Systems  Constitutional: Positive for fatigue. Negative for activity change, appetite change, chills, diaphoresis, fever and unexpected weight change.  HENT: Negative for congestion.   Eyes: Negative for visual disturbance.  Respiratory: Negative for cough, chest tightness, shortness of breath, wheezing and  stridor.   Cardiovascular: Negative for chest pain, palpitations and leg swelling.  Gastrointestinal: Negative for abdominal distention, abdominal pain, blood in stool, constipation, diarrhea, nausea and vomiting.  Endocrine: Negative for cold intolerance, heat intolerance, polydipsia, polyphagia and polyuria.  Genitourinary: Negative for difficulty urinating, flank pain and hematuria.  Musculoskeletal: Negative for arthralgias, back pain, gait problem, joint swelling, myalgias, neck pain and neck stiffness.  Skin: Negative for color change, pallor, rash and wound.  Neurological: Negative for dizziness, tremors, weakness and headaches.  Hematological: Does not bruise/bleed easily.  Psychiatric/Behavioral: Negative for decreased concentration, dysphoric mood, self-injury, sleep disturbance and suicidal ideas. The patient is nervous/anxious.        Objective:   Physical Exam  Constitutional: He is oriented to person, place, and time. He appears well-developed and well-nourished. No distress.  HENT:  Head: Normocephalic and atraumatic.  Right  Ear: External ear normal. Tympanic membrane is not erythematous and not bulging. Decreased hearing is noted.  Left Ear: External ear normal. Tympanic membrane is not erythematous and not bulging. Decreased hearing is noted.  Nose: Mucosal edema and rhinorrhea present. Right sinus exhibits no maxillary sinus tenderness and no frontal sinus tenderness. Left sinus exhibits no maxillary sinus tenderness and no frontal sinus tenderness.  Mouth/Throat: Uvula is midline, oropharynx is clear and moist and mucous membranes are normal.  Eyes: Conjunctivae are normal. Pupils are equal, round, and reactive to light.  Neck: Normal range of motion. Neck supple.  Cardiovascular: Normal rate, regular rhythm, normal heart sounds and intact distal pulses.  No murmur heard. Pulmonary/Chest: Effort normal and breath sounds normal. No respiratory distress. He has no wheezes. He has no rales. He exhibits no tenderness.  Abdominal: Soft. Bowel sounds are normal. He exhibits no distension and no mass. There is no tenderness. There is no rebound and no guarding. A hernia is present. Hernia confirmed positive in the ventral area.  Large, reducible ventral hernia  Genitourinary:  Genitourinary Comments: Declined DRE, genital examination  Musculoskeletal: He exhibits edema.       Right ankle: He exhibits swelling.       Left ankle: He exhibits swelling.       Right lower leg: He exhibits swelling.       Left lower leg: He exhibits swelling.  Lymphadenopathy:    He has no cervical adenopathy.  Neurological: He is alert and oriented to person, place, and time. Coordination normal.  Skin: Skin is warm and dry. No rash noted. He is not diaphoretic. No erythema. No pallor.  Psychiatric: He has a normal mood and affect. His behavior is normal. Judgment and thought content normal.  Nursing note and vitals reviewed.         Assessment & Plan:   1. Need for pneumococcal vaccination   2. Hypothyroidism, postradioiodine  therapy   3. Healthcare maintenance   4. Vitamin D deficiency   5. Dyslipidemia (high LDL; low HDL)   6. Elevated random blood glucose level   7. Hypertension, unspecified type   8. Long term current use of anticoagulant   9. Permanent atrial fibrillation ([place])   10. Anxiety   11. Hyperlipidemia, unspecified hyperlipidemia type     HTN (hypertension) Excellent BP- 93/60, HR 71 Has lost 23 lbs since last OV Currently on Lisinopril 10mg  BID, Furosemide 40mg  QD, carvedilol 80mg  QD  Hypothyroidism, postradioiodine therapy TSH drawn today Currently on levothyroxine 200 mcg/day  Permanent atrial fibrillation Followed by cards, on dabigatran 150mg  BID He denies excessive bleeding/bruising  Anxiety [place] Controlled Substance Database reviewed. Well controlled on PRN Alprazolam 0.5mg  Refill provided today  Hyperlipidemia Lipids drawn today Currently on simvastatin 40mg  QD, denies myalgias  Healthcare maintenance Continue all medications as directed. AAA screening and LDCT ordered today due to your smoking history. We will call you when your lab results are available. GREAT JOB on the healthy eating and regular walking! Follow-up in 6 months, sooner if needed.   Pt was in the office today for 25+ minutes, with over 50% time spent in face to face counseling of patient's various medical conditions and in coordination of care  FOLLOW-UP:  Return in about 6 months (around 08/25/2018).

## 2018-02-22 ENCOUNTER — Encounter: Payer: Self-pay | Admitting: Adult Health

## 2018-02-22 ENCOUNTER — Other Ambulatory Visit: Payer: Self-pay | Admitting: Adult Health

## 2018-02-22 ENCOUNTER — Ambulatory Visit (INDEPENDENT_AMBULATORY_CARE_PROVIDER_SITE_OTHER): Payer: Medicare Other | Admitting: Adult Health

## 2018-02-22 VITALS — BP 93/60 | HR 71 | Ht 73.0 in | Wt 308.0 lb

## 2018-02-22 DIAGNOSIS — F17200 Nicotine dependence, unspecified, uncomplicated: Secondary | ICD-10-CM | POA: Diagnosis not present

## 2018-02-22 DIAGNOSIS — E785 Hyperlipidemia, unspecified: Secondary | ICD-10-CM | POA: Insufficient documentation

## 2018-02-22 DIAGNOSIS — I10 Essential (primary) hypertension: Secondary | ICD-10-CM

## 2018-02-22 DIAGNOSIS — Z0000 Encounter for general adult medical examination without abnormal findings: Secondary | ICD-10-CM | POA: Diagnosis not present

## 2018-02-22 DIAGNOSIS — F419 Anxiety disorder, unspecified: Secondary | ICD-10-CM

## 2018-02-22 DIAGNOSIS — E559 Vitamin D deficiency, unspecified: Secondary | ICD-10-CM | POA: Diagnosis not present

## 2018-02-22 DIAGNOSIS — Z7901 Long term (current) use of anticoagulants: Secondary | ICD-10-CM

## 2018-02-22 DIAGNOSIS — Z23 Encounter for immunization: Secondary | ICD-10-CM

## 2018-02-22 DIAGNOSIS — E890 Postprocedural hypothyroidism: Secondary | ICD-10-CM | POA: Diagnosis not present

## 2018-02-22 DIAGNOSIS — I4821 Permanent atrial fibrillation: Secondary | ICD-10-CM

## 2018-02-22 DIAGNOSIS — Z720 Tobacco use: Secondary | ICD-10-CM | POA: Diagnosis not present

## 2018-02-22 DIAGNOSIS — R7309 Other abnormal glucose: Secondary | ICD-10-CM

## 2018-02-22 DIAGNOSIS — I482 Chronic atrial fibrillation: Secondary | ICD-10-CM | POA: Diagnosis not present

## 2018-02-22 MED ORDER — ALPRAZOLAM 0.5 MG PO TABS: 0.5000 mg | ORAL_TABLET | Freq: Three times a day (TID) | ORAL | 0 refills | Status: DC | PRN

## 2018-02-22 NOTE — Assessment & Plan Note (Signed)
>>  ASSESSMENT AND PLAN FOR HYPOTHYROIDISM, POSTRADIOIODINE THERAPY WRITTEN ON 02/22/2018  8:52 AM BY DANFORD, KATY D, NP  TSH drawn today Currently on levothyroxine 200 mcg/day

## 2018-02-22 NOTE — Assessment & Plan Note (Signed)
Excellent BP- 93/60, HR 71 Has lost 23 lbs since last OV Currently on Lisinopril 10mg  BID, Furosemide 40mg  QD, carvedilol 80mg  QD

## 2018-02-22 NOTE — Patient Instructions (Signed)

## 2018-02-22 NOTE — Assessment & Plan Note (Signed)
Lipids drawn today Currently on simvastatin 40mg  QD, denies myalgias

## 2018-02-22 NOTE — Assessment & Plan Note (Signed)
Followed by cards, on dabigatran 150mg  BID He denies excessive bleeding/bruising

## 2018-02-22 NOTE — Assessment & Plan Note (Signed)
TSH drawn today Currently on levothyroxine 200 mcg/day

## 2018-02-22 NOTE — Assessment & Plan Note (Signed)
[place] Controlled Substance Database reviewed. Well controlled on PRN Alprazolam 0.5mg  Refill provided today

## 2018-02-22 NOTE — Assessment & Plan Note (Signed)
Continue all medications as directed. AAA screening and LDCT ordered today due to your smoking history. We will call you when your lab results are available. GREAT JOB on the healthy eating and regular walking! Follow-up in 6 months, sooner if needed.

## 2018-02-23 LAB — COMPREHENSIVE METABOLIC PANEL
ALT: 17 IU/L (ref 0–44)
AST: 19 IU/L (ref 0–40)
Albumin/Globulin Ratio: 1.6 (ref 1.2–2.2)
Albumin: 4.4 g/dL (ref 3.6–4.8)
Alkaline Phosphatase: 70 IU/L (ref 39–117)
BUN/Creatinine Ratio: 14 (ref 10–24)
BUN: 15 mg/dL (ref 8–27)
Bilirubin Total: 0.6 mg/dL (ref 0.0–1.2)
CALCIUM: 9.9 mg/dL (ref 8.6–10.2)
CO2: 21 mmol/L (ref 20–29)
CREATININE: 1.04 mg/dL (ref 0.76–1.27)
Chloride: 102 mmol/L (ref 96–106)
GFR calc Af Amer: 86 mL/min/{1.73_m2} (ref >59)
GFR, EST NON AFRICAN AMERICAN: 74 mL/min/{1.73_m2} (ref >59)
Globulin, Total: 2.7 g/dL (ref 1.5–4.5)
Glucose: 102 mg/dL — ABNORMAL HIGH (ref 65–99)
POTASSIUM: 3.9 mmol/L (ref 3.5–5.2)
Sodium: 143 mmol/L (ref 134–144)
Total Protein: 7.1 g/dL (ref 6.0–8.5)

## 2018-02-23 LAB — CBC WITH DIFFERENTIAL/PLATELET
BASOS: 1 %
Basophils Absolute: 0 10*3/uL (ref 0.0–0.2)
EOS (ABSOLUTE): 0.1 10*3/uL (ref 0.0–0.4)
EOS: 2 %
Hematocrit: 44.6 % (ref 37.5–51.0)
Hemoglobin: 15.2 g/dL (ref 13.0–17.7)
IMMATURE GRANS (ABS): 0 10*3/uL (ref 0.0–0.1)
IMMATURE GRANULOCYTES: 0 %
LYMPHS: 26 %
Lymphocytes Absolute: 2.2 10*3/uL (ref 0.7–3.1)
MCH: 32 pg (ref 26.6–33.0)
MCHC: 34.1 g/dL (ref 31.5–35.7)
MCV: 94 fL (ref 79–97)
Monocytes Absolute: 1 10*3/uL — ABNORMAL HIGH (ref 0.1–0.9)
Monocytes: 12 %
NEUTROS PCT: 59 %
Neutrophils Absolute: 5.1 10*3/uL (ref 1.4–7.0)
PLATELETS: 228 10*3/uL (ref 150–379)
RBC: 4.75 x10E6/uL (ref 4.14–5.80)
RDW: 12.9 % (ref 12.3–15.4)
WBC: 8.5 10*3/uL (ref 3.4–10.8)

## 2018-02-23 LAB — TSH: TSH: 2.58 u[IU]/mL (ref 0.450–4.500)

## 2018-02-23 LAB — LIPID PANEL
Chol/HDL Ratio: 2.9 ratio (ref 0.0–5.0)
Cholesterol, Total: 97 mg/dL — ABNORMAL LOW (ref 100–199)
HDL: 33 mg/dL — ABNORMAL LOW (ref >39)
LDL Calculated: 46 mg/dL (ref 0–99)
Triglycerides: 90 mg/dL (ref 0–149)
VLDL Cholesterol Cal: 18 mg/dL (ref 5–40)

## 2018-02-23 LAB — VITAMIN D 25 HYDROXY (VIT D DEFICIENCY, FRACTURES): VIT D 25 HYDROXY: 41.5 ng/mL (ref 30.0–100.0)

## 2018-02-23 LAB — HEMOGLOBIN A1C
ESTIMATED AVERAGE GLUCOSE: 108 mg/dL
HEMOGLOBIN A1C: 5.4 % (ref 4.8–5.6)

## 2018-03-02 ENCOUNTER — Ambulatory Visit
Admission: RE | Admit: 2018-03-02 | Discharge: 2018-03-02 | Disposition: A | Discharge status: Home / Self Care | Payer: Medicare Other | Source: Ambulatory Visit | Attending: Adult Health | Admitting: Adult Health

## 2018-03-02 DIAGNOSIS — I714 Abdominal aortic aneurysm, without rupture: Secondary | ICD-10-CM | POA: Diagnosis not present

## 2018-03-02 DIAGNOSIS — Z136 Encounter for screening for cardiovascular disorders: Secondary | ICD-10-CM | POA: Diagnosis not present

## 2018-03-02 DIAGNOSIS — Z87891 Personal history of nicotine dependence: Secondary | ICD-10-CM | POA: Diagnosis not present

## 2018-03-23 ENCOUNTER — Telehealth: Payer: Self-pay | Admitting: Cardiovascular Disease

## 2018-03-23 NOTE — Telephone Encounter (Signed)
Results available in Epic.  Routed to MD to review

## 2018-03-23 NOTE — Telephone Encounter (Signed)
New Message   His pcp would like Dr C to look at the 2 test results that he had done.  The tests were the Abdominal Aorta screening and the Ct Chest lung screening

## 2018-03-24 NOTE — Telephone Encounter (Signed)
Patient aware of MD recommendations 

## 2018-03-24 NOTE — Telephone Encounter (Signed)
LM with wife for patient to return call.  

## 2018-03-24 NOTE — Telephone Encounter (Signed)
Please tell him I have reviewed both studies. I don't think either one will change our plan of treatment, as long as he has no symptoms (e.g. chest pain with exertion, unexplained abdominal pain). He should continue taking his cholesterol lowering medicine. Ideally, he will stop smoking, but he only smokes occasionally and in the past has declined to quit completely. He should not take ASA since he takes Pradaxa.  MCr

## 2018-03-27 ENCOUNTER — Ambulatory Visit (INDEPENDENT_AMBULATORY_CARE_PROVIDER_SITE_OTHER): Payer: Medicare Other | Admitting: *Deleted

## 2018-03-27 DIAGNOSIS — I420 Dilated cardiomyopathy: Secondary | ICD-10-CM

## 2018-03-27 NOTE — Progress Notes (Signed)
Remote ICD transmission.   

## 2018-03-29 ENCOUNTER — Encounter: Payer: Self-pay | Admitting: Cardiology

## 2018-03-29 LAB — CUP PACEART REMOTE DEVICE CHECK
Battery Remaining Longevity: 54 mo
Battery Voltage: 2.98 V
Brady Statistic AP VP Percent: 0 %
Brady Statistic AS VS Percent: 0.91 %
Brady Statistic RA Percent Paced: 0 %
Brady Statistic RV Percent Paced: 98.6 %
Date Time Interrogation Session: 20190415082504
HIGH POWER IMPEDANCE MEASURED VALUE: 43 Ohm
HighPow Impedance: 55 Ohm
Implantable Lead Implant Date: 20071217
Implantable Lead Implant Date: 20071217
Implantable Lead Location: 753858
Implantable Lead Model: 6947
Lead Channel Impedance Value: 361 Ohm
Lead Channel Impedance Value: 665 Ohm
Lead Channel Pacing Threshold Amplitude: 0.75 V
Lead Channel Pacing Threshold Amplitude: 0.875 V
Lead Channel Pacing Threshold Pulse Width: 0.4 ms
Lead Channel Pacing Threshold Pulse Width: 0.8 ms
Lead Channel Sensing Intrinsic Amplitude: 4.625 mV
Lead Channel Sensing Intrinsic Amplitude: 4.625 mV
Lead Channel Setting Pacing Amplitude: 2 V
Lead Channel Setting Pacing Amplitude: 2.5 V
Lead Channel Setting Sensing Sensitivity: 0.3 mV
MDC IDC LEAD IMPLANT DT: 20071217
MDC IDC LEAD LOCATION: 753859
MDC IDC LEAD LOCATION: 753860
MDC IDC MSMT LEADCHNL LV IMPEDANCE VALUE: 285 Ohm
MDC IDC MSMT LEADCHNL LV IMPEDANCE VALUE: 532 Ohm
MDC IDC MSMT LEADCHNL RA IMPEDANCE VALUE: 418 Ohm
MDC IDC MSMT LEADCHNL RV IMPEDANCE VALUE: 304 Ohm
MDC IDC PG IMPLANT DT: 20160524
MDC IDC SET LEADCHNL LV PACING PULSEWIDTH: 0.8 ms
MDC IDC SET LEADCHNL RV PACING PULSEWIDTH: 0.4 ms
MDC IDC STAT BRADY AP VS PERCENT: 0 %
MDC IDC STAT BRADY AS VP PERCENT: 99.09 %

## 2018-04-10 ENCOUNTER — Telehealth: Payer: Self-pay | Admitting: Cardiovascular Disease

## 2018-04-10 NOTE — Telephone Encounter (Signed)
New message   Patient calling  to report some dizziness. Patient states his medication may need adjustment. He has  Lost 40lbs since January; diet and exercise.   Patient is requesting labs to be drawn to include checking his testosterone level.

## 2018-04-10 NOTE — Telephone Encounter (Signed)
Spoke with pt he states he has been walking and doing no carb diet since January and has lost 40 lbs. Pt has c/o of dizziness when he stands up at times and wonders if his BP mediations need to be adjusted. Pt stated otherwise he feels fine. He stated previously Dr. Loletha Grayer has decreased his Lisinopril to once daily from BID and he experienced headaches and his BP increase to150-160/80-90 from his normal BP of 110/60-70. Pt would like to know if there are any medication changes to be made since he has lost weight that could help with the dizziness.  Pt takes medications as instructed: Coreg 80mg  Qdaily (takes at night); Lisinopril 10mg  Qdaily; Lasix 40mg  Qdaily  In addition, patient would like to start lifting weights but wants to makes sure it is okay with his history of abdominal aortic ectasia.  Pt has scheduled OV with L.Kilroy on 5/29, asked him to check BP at home and keep a log until this appointment. Also informed pt we will call him back with any suggested changes, if there are any, otherwise we will see him at the end of May.

## 2018-04-12 NOTE — Telephone Encounter (Signed)
OK to lift light weights (needs to be able to do 10-15 reps without interruption, without straining or holding his breath). If he has to strain.hold breath, needs to reduce the weight. Let's try cutting back the lasix to 20 mg daily and see if that reduces the orthostatic hypotension that is responsible for his dizziness. MCr

## 2018-04-13 NOTE — Telephone Encounter (Signed)
Pt made aware of Dr. Lurline Del weight lifting instructions and given medication adjustment. Pt aware he can cut 40 mg tablets in half. No additional questions at this time.

## 2018-04-25 ENCOUNTER — Encounter: Payer: Self-pay | Admitting: Adult Health

## 2018-04-25 ENCOUNTER — Ambulatory Visit (INDEPENDENT_AMBULATORY_CARE_PROVIDER_SITE_OTHER): Payer: Medicare Other | Admitting: Adult Health

## 2018-04-25 VITALS — BP 106/70 | HR 88 | Temp 98.2°F | Ht 73.0 in | Wt 293.0 lb

## 2018-04-25 DIAGNOSIS — R05 Cough: Secondary | ICD-10-CM | POA: Diagnosis not present

## 2018-04-25 DIAGNOSIS — Z720 Tobacco use: Secondary | ICD-10-CM | POA: Diagnosis not present

## 2018-04-25 DIAGNOSIS — J209 Acute bronchitis, unspecified: Secondary | ICD-10-CM | POA: Diagnosis not present

## 2018-04-25 DIAGNOSIS — R059 Cough, unspecified: Secondary | ICD-10-CM | POA: Insufficient documentation

## 2018-04-25 MED ORDER — FLUTICASONE PROPIONATE 50 MCG/ACT NA SUSP: 2.0000 | Freq: Every day | NASAL | 6 refills | Status: DC

## 2018-04-25 MED ORDER — HYDROCOD POLST-CPM POLST ER 10-8 MG/5ML PO SUER: 5.0000 mL | Freq: Two times a day (BID) | ORAL | 0 refills | Status: DC | PRN

## 2018-04-25 MED ORDER — IPRATROPIUM-ALBUTEROL 0.5-2.5 (3) MG/3ML IN SOLN
3.0000 mL | Freq: Four times a day (QID) | RESPIRATORY_TRACT | Status: DC
  Administered 2018-04-25: 3 mL via RESPIRATORY_TRACT

## 2018-04-25 MED ORDER — AMOXICILLIN-POT CLAVULANATE 875-125 MG PO TABS: 1.0000 | ORAL_TABLET | Freq: Two times a day (BID) | ORAL | 0 refills | Status: DC

## 2018-04-25 MED ORDER — ALBUTEROL SULFATE HFA 108 (90 BASE) MCG/ACT IN AERS: 2.0000 | INHALATION_SPRAY | Freq: Four times a day (QID) | RESPIRATORY_TRACT | 0 refills | Status: DC | PRN

## 2018-04-25 NOTE — Assessment & Plan Note (Signed)
Please take Augmentin as directed. Please use Tussionex, Albuterol, and Flonase as needed. [place] Controlled Substance Database reviewed- no aberrancies noted. Do not take Alprazolam when using Tussionex. Do not drive when taking Tussionex. Increase water intake/rest/vit c-2,000mg /day. If symptoms persist after antibiotic completed, then please call clinic.

## 2018-04-25 NOTE — Patient Instructions (Addendum)
Acute Bronchitis, Adult Acute bronchitis is sudden (acute) swelling of the air tubes (bronchi) in the lungs. Acute bronchitis causes these tubes to fill with mucus, which can make it hard to breathe. It can also cause coughing or wheezing. In adults, acute bronchitis usually goes away within 2 weeks. A cough caused by bronchitis may last up to 3 weeks. Smoking, allergies, and asthma can make the condition worse. Repeated episodes of bronchitis may cause further lung problems, such as chronic obstructive pulmonary disease (COPD). What are the causes? This condition can be caused by germs and by substances that irritate the lungs, including:  Cold and flu viruses. This condition is most often caused by the same virus that causes a cold.  Bacteria.  Exposure to tobacco smoke, dust, fumes, and air pollution.  What increases the risk? This condition is more likely to develop in people who:  Have close contact with someone with acute bronchitis.  Are exposed to lung irritants, such as tobacco smoke, dust, fumes, and vapors.  Have a weak immune system.  Have a respiratory condition such as asthma.  What are the signs or symptoms? Symptoms of this condition include:  A cough.  Coughing up clear, yellow, or green mucus.  Wheezing.  Chest congestion.  Shortness of breath.  A fever.  Body aches.  Chills.  A sore throat.  How is this diagnosed? This condition is usually diagnosed with a physical exam. During the exam, your health care provider may order tests, such as chest X-rays, to rule out other conditions. He or she may also:  Test a sample of your mucus for bacterial infection.  Check the level of oxygen in your blood. This is done to check for pneumonia.  Do a chest X-ray or lung function testing to rule out pneumonia and other conditions.  Perform blood tests.  Your health care provider will also ask about your symptoms and medical history. How is this  treated? Most cases of acute bronchitis clear up over time without treatment. Your health care provider may recommend:  Drinking more fluids. Drinking more makes your mucus thinner, which may make it easier to breathe.  Taking a medicine for a fever or cough.  Taking an antibiotic medicine.  Using an inhaler to help improve shortness of breath and to control a cough.  Using a cool mist vaporizer or humidifier to make it easier to breathe.  Follow these instructions at home: Medicines  Take over-the-counter and prescription medicines only as told by your health care provider.  If you were prescribed an antibiotic, take it as told by your health care provider. Do not stop taking the antibiotic even if you start to feel better. General instructions  Get plenty of rest.  Drink enough fluids to keep your urine clear or pale yellow.  Avoid smoking and secondhand smoke. Exposure to cigarette smoke or irritating chemicals will make bronchitis worse. If you smoke and you need help quitting, ask your health care provider. Quitting smoking will help your lungs heal faster.  Use an inhaler, cool mist vaporizer, or humidifier as told by your health care provider.  Keep all follow-up visits as told by your health care provider. This is important. How is this prevented? To lower your risk of getting this condition again:  Wash your hands often with soap and water. If soap and water are not available, use hand sanitizer.  Avoid contact with people who have cold symptoms.  Try not to touch your hands to your   mouth, nose, or eyes.  Make sure to get the flu shot every year.  Contact a health care provider if:  Your symptoms do not improve in 2 weeks of treatment. Get help right away if:  You cough up blood.  You have chest pain.  You have severe shortness of breath.  You become dehydrated.  You faint or keep feeling like you are going to faint.  You keep vomiting.  You have a  severe headache.  Your fever or chills gets worse. This information is not intended to replace advice given to you by your health care provider. Make sure you discuss any questions you have with your health care provider. Document Released: 01/06/2005 Document Revised: 06/23/2016 Document Reviewed: 05/19/2016 Elsevier Interactive Patient Education  2018 [place].   Cough, Adult Coughing is a reflex that clears your throat and your airways. Coughing helps to heal and protect your lungs. It is normal to cough occasionally, but a cough that happens with other symptoms or lasts a long time may be a sign of a condition that needs treatment. A cough may last only 2-3 weeks (acute), or it may last longer than 8 weeks (chronic). What are the causes? Coughing is commonly caused by:  Breathing in substances that irritate your lungs.  A viral or bacterial respiratory infection.  Allergies.  Asthma.  Postnasal drip.  Smoking.  Acid backing up from the stomach into the esophagus (gastroesophageal reflux).  Certain medicines.  Chronic lung problems, including COPD (or rarely, lung cancer).  Other medical conditions such as heart failure.  Follow these instructions at home: Pay attention to any changes in your symptoms. Take these actions to help with your discomfort:  Take medicines only as told by your health care provider. ? If you were prescribed an antibiotic medicine, take it as told by your health care provider. Do not stop taking the antibiotic even if you start to feel better. ? Talk with your health care provider before you take a cough suppressant medicine.  Drink enough fluid to keep your urine clear or pale yellow.  If the air is dry, use a cold steam vaporizer or humidifier in your bedroom or your home to help loosen secretions.  Avoid anything that causes you to cough at work or at home.  If your cough is worse at night, try sleeping in a semi-upright  position.  Avoid cigarette smoke. If you smoke, quit smoking. If you need help quitting, ask your health care provider.  Avoid caffeine.  Avoid alcohol.  Rest as needed.  Contact a health care provider if:  You have new symptoms.  You cough up pus.  Your cough does not get better after 2-3 weeks, or your cough gets worse.  You cannot control your cough with suppressant medicines and you are losing sleep.  You develop pain that is getting worse or pain that is not controlled with pain medicines.  You have a fever.  You have unexplained weight loss.  You have night sweats. Get help right away if:  You cough up blood.  You have difficulty breathing.  Your heartbeat is very fast. This information is not intended to replace advice given to you by your health care provider. Make sure you discuss any questions you have with your health care provider. Document Released: 05/28/2011 Document Revised: 05/06/2016 Document Reviewed: 02/05/2015 Elsevier Interactive Patient Education  2018 [employer].   Please take Augmentin as directed. Please use Tussionex, Albuterol, and Flonase as needed. Do  not take Alprazolam when using Tussionex. Do not drive when taking Tussionex. Increase water intake/rest/vit c-2,000mg /day. If symptoms persist after antibiotic completed, then please call clinic. FEEL BETTER!

## 2018-04-25 NOTE — Progress Notes (Signed)
Subjective:    Patient ID: Brian Craig, male    DOB: [birth year], 67 y.o.   MRN: [id number]  HPI:  Brian Craig presents productive cough (thick/yellow mucus), nasal congestion (thick/yellow), frontal HA (2/10), wheezing, and chills that have been steadily worsening >11 days. He experienced N/V on Sunday, two days ago and diarrhea yesterday. He denies current GI upset. He has been increasing fluids and using OTC NyQuil with some sx relief. He continues to smoke, 1/2 pack/day, he again declined smoking cessation today.  He denies CP/dyspnea at rest.  Patient Care Team    Relationship Specialty Notifications Start End  Mina Marble D, NP PCP - General Family Medicine  10/07/17   Brendolyn Patty, MD  Dermatology  02/21/17   Warden Fillers, MD Consulting Physician Ophthalmology  02/21/17   Amalia Greenhouse, MD Referring Physician Endocrinology  02/21/17   Sanda Klein, MD Consulting Physician Cardiology  02/21/17     Patient Active Problem List   Diagnosis Date Noted  . Hyperlipidemia 02/22/2018  . Testicular swelling, left 10/10/2017  . HTN (hypertension) 08/25/2017  . Anxiety 08/25/2017  . Healthcare maintenance 08/25/2017  . Localized swelling, mass or lump of neck 06/01/2017  . Vitamin D deficiency 03/04/2017  . Long term current use of anticoagulant 09/20/2016  . Chronic diastolic heart failure ([place]) 02/02/2016  . History of alcohol dependence ([place]) 02/02/2016  . ICD (implantable cardioverter-defibrillator) battery depletion 04/17/2015  . Congestive dilated cardiomyopathy ([place]) 07/15/2014  . Chronic combined systolic and diastolic CHF, NYHA class 1 ([place]) 07/15/2014  . OSA on CPAP 07/15/2014  . Morbid obesity ([place]) 07/15/2014  . Permanent atrial fibrillation ([place]) 07/15/2014  . CHB (complete heart block) ([place]) 07/15/2014  . Biventricular ICD  implanted 2007, generator change in 2012 07/15/2014  . Tobacco abuse 07/15/2014  . Alcoholism in recovery ([place]) 07/15/2014  .  Hypothyroidism, postradioiodine therapy 07/15/2014  . Dyslipidemia (high LDL; low HDL) 07/15/2014     Past Medical History:  Diagnosis Date  . Atrial fibrillation ([place])   . CHF (congestive heart failure) ([place])   . Graves disease    Graves  . Hyperlipidemia   . Hypertension   . OSA treated with BiPAP   . Substance abuse ([place])    alcohol     Past Surgical History:  Procedure Laterality Date  . APPENDECTOMY    . BIV ICD GENERTAOR CHANGE OUT Left 2012   Medtronic Protecta  . CARDIAC ELECTROPHYSIOLOGY STUDY AND ABLATION    . EP IMPLANTABLE DEVICE N/A 05/06/2015   Procedure: ICD Generator Changeout;  Surgeon: Sanda Klein, MD;  Location: [facility];  Service: Cardiovascular;  Laterality: N/A;  . HERNIA REPAIR    . MENISCUS REPAIR Right   . VASECTOMY     unilateral due to injury     Family History  Problem Relation Age of Onset  . Heart disease Mother   . Hypertension Mother   . Heart disease Father   . Hyperlipidemia Father   . Hypertension Father   . Heart attack Father   . Heart disease Paternal Aunt   . Heart disease Paternal Uncle   . Alcohol abuse Maternal Uncle   . Alcohol abuse Maternal Grandfather      Social History   Substance and Sexual Activity  Drug Use No     Social History   Substance and Sexual Activity  Alcohol Use No   Comment: recovering alcoholic     Social History   Tobacco Use  Smoking Status Current  Every Day Smoker  . Packs/day: 0.50  . Years: 51.00  . Pack years: 25.50  . Types: Cigarettes  Smokeless Tobacco Never Used     Outpatient Encounter Medications as of 04/25/2018  Medication Sig  . acetaminophen (TYLENOL) 325 MG tablet Take 650 mg by mouth every 6 (six) hours as needed for headache.  . ALPRAZolam (XANAX) 0.5 MG tablet Take 1 tablet (0.5 mg total) by mouth 3 (three) times daily as needed for anxiety.  . carvedilol (COREG CR) 80 MG 24 hr capsule Take 1 capsule (80 mg total) by mouth daily.  .  Cholecalciferol (CVS D3) 2000 units CAPS Take 2,000 Units by mouth daily.  . dabigatran (PRADAXA) 150 MG CAPS capsule Take 1 capsule (150 mg total) by mouth 2 (two) times daily.  . furosemide (LASIX) 40 MG tablet Take 1 tablet (40 mg total) by mouth daily.  Marland Kitchen levothyroxine (SYNTHROID, LEVOTHROID) 200 MCG tablet Take 200 mcg by mouth daily before breakfast.  . lisinopril (PRINIVIL,ZESTRIL) 10 MG tablet Take 1 tablet (10 mg total) by mouth 2 (two) times daily.  . Multiple Vitamin (MULTIVITAMIN) tablet Take 1 tablet by mouth daily.  . potassium chloride (KLOR-CON 10) 10 MEQ tablet Take 1 tablet (10 mEq total) by mouth daily.  . sertraline (ZOLOFT) 50 MG tablet TAKE 1 TABLET DAILY  . simvastatin (ZOCOR) 40 MG tablet Take 1 tablet (40 mg total) by mouth daily.  Marland Kitchen albuterol (PROVENTIL HFA;VENTOLIN HFA) 108 (90 Base) MCG/ACT inhaler Inhale 2 puffs into the lungs every 6 (six) hours as needed for wheezing or shortness of breath.  Marland Kitchen amoxicillin-clavulanate (AUGMENTIN) 875-125 MG tablet Take 1 tablet by mouth 2 (two) times daily.  . chlorpheniramine-HYDROcodone (TUSSIONEX) 10-8 MG/5ML SUER Take 5 mLs by mouth every 12 (twelve) hours as needed.   No facility-administered encounter medications on file as of 04/25/2018.     Allergies: Patient has no known allergies.  Body mass index is 38.66 kg/m.  Blood pressure 106/70, pulse 88, temperature 98.2 F (36.8 C), temperature source Oral, height 6\' 1"  (1.854 m), weight 293 lb (132.9 kg), SpO2 96 %.  Review of Systems  Constitutional: Positive for activity change, chills and fatigue. Negative for appetite change, diaphoresis, fever and unexpected weight change.  HENT: Positive for congestion, postnasal drip, rhinorrhea, sinus pressure, sinus pain, sneezing, sore throat and voice change. Negative for trouble swallowing.   Eyes: Negative for visual disturbance.  Respiratory: Positive for cough, shortness of breath and wheezing. Negative for chest tightness  and stridor.   Cardiovascular: Negative for chest pain, palpitations and leg swelling.  Gastrointestinal: Negative for abdominal distention, abdominal pain, blood in stool, constipation, diarrhea, nausea and vomiting.  Genitourinary: Negative for difficulty urinating and flank pain.  Neurological: Positive for headaches. Negative for dizziness.  Hematological: Does not bruise/bleed easily.  Psychiatric/Behavioral: Positive for sleep disturbance.       Objective:   Physical Exam  Constitutional: He is oriented to person, place, and time. He appears well-developed and well-nourished. No distress.  HENT:  Head: Normocephalic and atraumatic.  Right Ear: External ear normal. Tympanic membrane is not erythematous and not bulging. No decreased hearing is noted.  Left Ear: External ear normal. Tympanic membrane is not erythematous and not bulging. No decreased hearing is noted.  Nose: Mucosal edema and rhinorrhea present. Right sinus exhibits maxillary sinus tenderness. Right sinus exhibits no frontal sinus tenderness. Left sinus exhibits maxillary sinus tenderness. Left sinus exhibits no frontal sinus tenderness.  Mouth/Throat: Uvula is midline and mucous membranes  are normal. Abnormal dentition. Posterior oropharyngeal edema and posterior oropharyngeal erythema present. No oropharyngeal exudate or tonsillar abscesses.  Eyes: Pupils are equal, round, and reactive to light. Conjunctivae and EOM are normal.  Neck: Normal range of motion. Neck supple.  Cardiovascular: Normal rate, regular rhythm, normal heart sounds and intact distal pulses.  No murmur heard. Pulmonary/Chest: Effort normal. No stridor. No respiratory distress. He has wheezes. He has no rales. He exhibits no tenderness.  Wheezing noted in all lung fields initially Breathing tx administered then- Wheezing cleared in lower/middle lung fields 100% Wheezing cleared in upper lung fields by 80%  Abdominal: Soft. Bowel sounds are normal.  He exhibits no distension and no mass. There is no tenderness. There is no rebound and no guarding. No hernia.  Lymphadenopathy:    He has no cervical adenopathy.  Neurological: He is alert and oriented to person, place, and time.  Skin: Skin is warm and dry. Capillary refill takes less than 2 seconds. No rash noted. He is not diaphoretic. No erythema. No pallor.  Psychiatric: He has a normal mood and affect. His behavior is normal. Judgment and thought content normal.      Assessment & Plan:   1. Tobacco abuse   2. Acute bronchitis, unspecified organism   3. Cough in adult     Tobacco abuse He declined tobacco cessation today.  Acute bronchitis Please take Augmentin as directed. Please use Tussionex, Albuterol, and Flonase as needed. [place] Controlled Substance Database reviewed- no aberrancies noted. Do not take Alprazolam when using Tussionex. Do not drive when taking Tussionex. Increase water intake/rest/vit c-2,000mg /day. If symptoms persist after antibiotic completed, then please call clinic.  FOLLOW-UP:  Return if symptoms worsen or fail to improve.

## 2018-04-25 NOTE — Assessment & Plan Note (Signed)
He declined tobacco cessation today.

## 2018-05-10 ENCOUNTER — Ambulatory Visit: Payer: Medicare Other | Admitting: Cardiology

## 2018-05-11 DIAGNOSIS — E039 Hypothyroidism, unspecified: Secondary | ICD-10-CM | POA: Diagnosis not present

## 2018-05-18 ENCOUNTER — Other Ambulatory Visit: Payer: Self-pay | Admitting: Adult Health

## 2018-06-26 ENCOUNTER — Ambulatory Visit (INDEPENDENT_AMBULATORY_CARE_PROVIDER_SITE_OTHER): Payer: Medicare Other | Admitting: *Deleted

## 2018-06-26 DIAGNOSIS — I5032 Chronic diastolic (congestive) heart failure: Secondary | ICD-10-CM

## 2018-06-26 DIAGNOSIS — I482 Chronic atrial fibrillation: Secondary | ICD-10-CM

## 2018-06-26 DIAGNOSIS — I4821 Permanent atrial fibrillation: Secondary | ICD-10-CM

## 2018-06-26 NOTE — Progress Notes (Signed)
Remote ICD transmission.   

## 2018-06-27 LAB — CUP PACEART REMOTE DEVICE CHECK
Brady Statistic AP VP Percent: 0 %
Brady Statistic AP VS Percent: 0 %
Brady Statistic AS VP Percent: 97.78 %
Date Time Interrogation Session: 20190715092728
HIGH POWER IMPEDANCE MEASURED VALUE: 44 Ohm
HIGH POWER IMPEDANCE MEASURED VALUE: 57 Ohm
Implantable Lead Implant Date: 20071217
Implantable Lead Location: 753858
Implantable Lead Location: 753860
Implantable Lead Model: 4194
Implantable Lead Model: 5076
Lead Channel Impedance Value: 247 Ohm
Lead Channel Impedance Value: 361 Ohm
Lead Channel Impedance Value: 418 Ohm
Lead Channel Impedance Value: 475 Ohm
Lead Channel Impedance Value: 475 Ohm
Lead Channel Impedance Value: 589 Ohm
Lead Channel Pacing Threshold Pulse Width: 0.8 ms
Lead Channel Sensing Intrinsic Amplitude: 4.25 mV
Lead Channel Setting Sensing Sensitivity: 0.3 mV
MDC IDC LEAD IMPLANT DT: 20071217
MDC IDC LEAD IMPLANT DT: 20071217
MDC IDC LEAD LOCATION: 753859
MDC IDC MSMT BATTERY REMAINING LONGEVITY: 51 mo
MDC IDC MSMT BATTERY VOLTAGE: 2.98 V
MDC IDC MSMT LEADCHNL LV PACING THRESHOLD AMPLITUDE: 0.875 V
MDC IDC MSMT LEADCHNL RA SENSING INTR AMPL: 4.25 mV
MDC IDC MSMT LEADCHNL RV PACING THRESHOLD AMPLITUDE: 0.75 V
MDC IDC MSMT LEADCHNL RV PACING THRESHOLD PULSEWIDTH: 0.4 ms
MDC IDC PG IMPLANT DT: 20160524
MDC IDC SET LEADCHNL LV PACING AMPLITUDE: 2 V
MDC IDC SET LEADCHNL LV PACING PULSEWIDTH: 0.8 ms
MDC IDC SET LEADCHNL RV PACING AMPLITUDE: 2.5 V
MDC IDC SET LEADCHNL RV PACING PULSEWIDTH: 0.4 ms
MDC IDC STAT BRADY AS VS PERCENT: 2.22 %
MDC IDC STAT BRADY RA PERCENT PACED: 0 %
MDC IDC STAT BRADY RV PERCENT PACED: 96.27 %

## 2018-06-28 ENCOUNTER — Encounter: Payer: Self-pay | Admitting: Cardiology

## 2018-07-24 ENCOUNTER — Ambulatory Visit (INDEPENDENT_AMBULATORY_CARE_PROVIDER_SITE_OTHER): Payer: Medicare Other | Admitting: Adult Health

## 2018-07-24 ENCOUNTER — Encounter: Payer: Self-pay | Admitting: Adult Health

## 2018-07-24 VITALS — BP 113/71 | HR 85 | Temp 98.7°F | Ht 73.0 in | Wt 300.0 lb

## 2018-07-24 DIAGNOSIS — Z0000 Encounter for general adult medical examination without abnormal findings: Secondary | ICD-10-CM | POA: Diagnosis not present

## 2018-07-24 DIAGNOSIS — J209 Acute bronchitis, unspecified: Secondary | ICD-10-CM

## 2018-07-24 MED ORDER — AMOXICILLIN-POT CLAVULANATE 875-125 MG PO TABS: 1.0000 | ORAL_TABLET | Freq: Two times a day (BID) | ORAL | 0 refills | Status: DC

## 2018-07-24 MED ORDER — ALBUTEROL SULFATE HFA 108 (90 BASE) MCG/ACT IN AERS: 2.0000 | INHALATION_SPRAY | Freq: Four times a day (QID) | RESPIRATORY_TRACT | 0 refills | Status: DC | PRN

## 2018-07-24 MED ORDER — HYDROCOD POLST-CPM POLST ER 10-8 MG/5ML PO SUER: 5.0000 mL | Freq: Two times a day (BID) | ORAL | 0 refills | Status: DC | PRN

## 2018-07-24 NOTE — Assessment & Plan Note (Signed)
Please take Augmentin as directed. Please take Tussionex and Albuterol as needed. Increase fluids/rest/vit c-2,000mg  a day when not feeling well. Try to reduce- stop tobacco use- you can do it! If symptoms persist after antibiotic completed, please call clinic.

## 2018-07-24 NOTE — Patient Instructions (Signed)

## 2018-07-24 NOTE — Progress Notes (Signed)
Subjective:    Patient ID: Brian Craig, male    DOB: 11/17/1951, 67 y.o.   MRN: [id number]  HPI:  Brian Craig presents with strong, productive cough (thick/yellow mucus), copious thick/yellow nasal drainage, constant frontal HA (rated 5/10), increased fatigue, intermittent dyspnea, and night sweats. He denies CP/palpitations/dizzinss/N/V/D. He has been using OTC Mucinex and NyQuil with only minimal sx relief. He continues to use tobacco- currently 1/2 pack/day. He has been smoking >50 years and declined smoking cessation today.  Patient Care Team    Relationship Specialty Notifications Start End  Mina Marble D, NP PCP - General Family Medicine  10/07/17   Brendolyn Patty, MD  Dermatology  02/21/17   Warden Fillers, MD Consulting Physician Ophthalmology  02/21/17   Amalia Greenhouse, MD Referring Physician Endocrinology  02/21/17   Sanda Klein, MD Consulting Physician Cardiology  02/21/17     Patient Active Problem List   Diagnosis Date Noted  . Acute bronchitis 04/25/2018  . Cough in adult 04/25/2018  . Hyperlipidemia 02/22/2018  . Testicular swelling, left 10/10/2017  . HTN (hypertension) 08/25/2017  . Anxiety 08/25/2017  . Healthcare maintenance 08/25/2017  . Localized swelling, mass or lump of neck 06/01/2017  . Vitamin D deficiency 03/04/2017  . Long term current use of anticoagulant 09/20/2016  . Chronic diastolic heart failure ([place]) 02/02/2016  . History of alcohol dependence ([place]) 02/02/2016  . ICD (implantable cardioverter-defibrillator) battery depletion 04/17/2015  . Congestive dilated cardiomyopathy ([place]) 07/15/2014  . Chronic combined systolic and diastolic CHF, NYHA class 1 ([place]) 07/15/2014  . OSA on CPAP 07/15/2014  . Morbid obesity ([place]) 07/15/2014  . Permanent atrial fibrillation ([place]) 07/15/2014  . CHB (complete heart block) ([place]) 07/15/2014  . Biventricular ICD  implanted 2007, generator change in 2012 07/15/2014  . Tobacco abuse 07/15/2014  . Alcoholism  in recovery ([place]) 07/15/2014  . Hypothyroidism, postradioiodine therapy 07/15/2014  . Dyslipidemia (high LDL; low HDL) 07/15/2014     Past Medical History:  Diagnosis Date  . Atrial fibrillation ([place])   . CHF (congestive heart failure) ([place])   . Graves disease    Graves  . Hyperlipidemia   . Hypertension   . OSA treated with BiPAP   . Substance abuse ([place])    alcohol     Past Surgical History:  Procedure Laterality Date  . APPENDECTOMY    . BIV ICD GENERTAOR CHANGE OUT Left 2012   Medtronic Protecta  . CARDIAC ELECTROPHYSIOLOGY STUDY AND ABLATION    . EP IMPLANTABLE DEVICE N/A 05/06/2015   Procedure: ICD Generator Changeout;  Surgeon: Sanda Klein, MD;  Location: [facility];  Service: Cardiovascular;  Laterality: N/A;  . HERNIA REPAIR    . MENISCUS REPAIR Right   . VASECTOMY     unilateral due to injury     Family History  Problem Relation Age of Onset  . Heart disease Mother   . Hypertension Mother   . Heart disease Father   . Hyperlipidemia Father   . Hypertension Father   . Heart attack Father   . Heart disease Paternal Aunt   . Heart disease Paternal Uncle   . Alcohol abuse Maternal Uncle   . Alcohol abuse Maternal Grandfather      Social History   Substance and Sexual Activity  Drug Use No     Social History   Substance and Sexual Activity  Alcohol Use No   Comment: recovering alcoholic     Social History   Tobacco Use  Smoking Status  Current Every Day Smoker  . Packs/day: 0.50  . Years: 51.00  . Pack years: 25.50  . Types: Cigarettes  Smokeless Tobacco Never Used     Outpatient Encounter Medications as of 07/24/2018  Medication Sig  . acetaminophen (TYLENOL) 325 MG tablet Take 650 mg by mouth every 6 (six) hours as needed for headache.  . albuterol (PROVENTIL HFA;VENTOLIN HFA) 108 (90 Base) MCG/ACT inhaler Inhale 2 puffs into the lungs every 6 (six) hours as needed for wheezing or shortness of breath.  . ALPRAZolam (XANAX)  0.5 MG tablet Take 1 tablet (0.5 mg total) by mouth 3 (three) times daily as needed for anxiety.  . carvedilol (COREG CR) 80 MG 24 hr capsule Take 1 capsule (80 mg total) by mouth daily.  . chlorpheniramine-HYDROcodone (TUSSIONEX) 10-8 MG/5ML SUER Take 5 mLs by mouth every 12 (twelve) hours as needed.  . Cholecalciferol (CVS D3) 2000 units CAPS Take 2,000 Units by mouth daily.  . dabigatran (PRADAXA) 150 MG CAPS capsule Take 1 capsule (150 mg total) by mouth 2 (two) times daily.  . fluticasone (FLONASE) 50 MCG/ACT nasal spray Place 2 sprays into both nostrils daily.  . furosemide (LASIX) 40 MG tablet Take 1 tablet (40 mg total) by mouth daily.  Marland Kitchen levothyroxine (SYNTHROID, LEVOTHROID) 200 MCG tablet Take 200 mcg by mouth daily before breakfast.  . lisinopril (PRINIVIL,ZESTRIL) 10 MG tablet Take 1 tablet (10 mg total) by mouth 2 (two) times daily.  . Multiple Vitamin (MULTIVITAMIN) tablet Take 1 tablet by mouth daily.  . potassium chloride (KLOR-CON 10) 10 MEQ tablet Take 1 tablet (10 mEq total) by mouth daily.  . sertraline (ZOLOFT) 50 MG tablet TAKE 1 TABLET DAILY  . simvastatin (ZOCOR) 40 MG tablet Take 1 tablet (40 mg total) by mouth daily.  . [DISCONTINUED] albuterol (PROVENTIL HFA;VENTOLIN HFA) 108 (90 Base) MCG/ACT inhaler Inhale 2 puffs into the lungs every 6 (six) hours as needed for wheezing or shortness of breath.  . [DISCONTINUED] albuterol (PROVENTIL HFA;VENTOLIN HFA) 108 (90 Base) MCG/ACT inhaler Inhale 2 puffs into the lungs every 6 (six) hours as needed for wheezing or shortness of breath.  . [DISCONTINUED] chlorpheniramine-HYDROcodone (TUSSIONEX) 10-8 MG/5ML SUER Take 5 mLs by mouth every 12 (twelve) hours as needed.  . [DISCONTINUED] chlorpheniramine-HYDROcodone (TUSSIONEX) 10-8 MG/5ML SUER Take 5 mLs by mouth every 12 (twelve) hours as needed.  Marland Kitchen amoxicillin-clavulanate (AUGMENTIN) 875-125 MG tablet Take 1 tablet by mouth 2 (two) times daily.  . [DISCONTINUED]  amoxicillin-clavulanate (AUGMENTIN) 875-125 MG tablet Take 1 tablet by mouth 2 (two) times daily.  . [DISCONTINUED] amoxicillin-clavulanate (AUGMENTIN) 875-125 MG tablet Take 1 tablet by mouth 2 (two) times daily.   Facility-Administered Encounter Medications as of 07/24/2018  Medication  . ipratropium-albuterol (DUONEB) 0.5-2.5 (3) MG/3ML nebulizer solution 3 mL    Allergies: Patient has no known allergies.  Body mass index is 39.58 kg/m.  Blood pressure 113/71, pulse 85, temperature 98.7 F (37.1 C), temperature source Oral, height 6\' 1"  (1.854 m), weight 300 lb (136.1 kg), SpO2 97 %.  Review of Systems  Constitutional: Positive for activity change, chills and fatigue. Negative for appetite change, diaphoresis, fever and unexpected weight change.  HENT: Positive for congestion, postnasal drip and sinus pressure. Negative for sore throat and voice change.   Eyes: Negative for visual disturbance.  Respiratory: Positive for cough and shortness of breath. Negative for chest tightness and stridor.   Cardiovascular: Negative for chest pain, palpitations and leg swelling.  Gastrointestinal: Negative for abdominal distention, abdominal pain,  blood in stool, constipation, diarrhea, nausea and vomiting.  Skin: Negative for color change, pallor, rash and wound.  Neurological: Negative for dizziness and headaches.  Hematological: Does not bruise/bleed easily.  Psychiatric/Behavioral: Positive for sleep disturbance.      Objective:   Physical Exam  Constitutional: He is oriented to person, place, and time. He appears well-developed and well-nourished. No distress.  HENT:  Head: Normocephalic and atraumatic.  Right Ear: External ear normal. Tympanic membrane is erythematous. Tympanic membrane is not bulging. No decreased hearing is noted.  Left Ear: External ear normal. Tympanic membrane is erythematous. Tympanic membrane is not bulging. No decreased hearing is noted.  Nose: Mucosal edema and  rhinorrhea present. Right sinus exhibits no maxillary sinus tenderness and no frontal sinus tenderness. Left sinus exhibits no maxillary sinus tenderness and no frontal sinus tenderness.  Mouth/Throat: Uvula is midline and mucous membranes are normal. Posterior oropharyngeal edema and posterior oropharyngeal erythema present. No tonsillar abscesses.  Eyes: Pupils are equal, round, and reactive to light. Conjunctivae and EOM are normal.  Neck: Normal range of motion. Neck supple.  Cardiovascular: Normal rate, regular rhythm, normal heart sounds and intact distal pulses.  No murmur heard. Pulmonary/Chest: Effort normal and breath sounds normal. No stridor. No respiratory distress. He has no decreased breath sounds. He has no wheezes. He has no rhonchi. He has no rales. He exhibits no tenderness.  Lymphadenopathy:    He has no cervical adenopathy.  Neurological: He is alert and oriented to person, place, and time.  Skin: Skin is warm and dry. Capillary refill takes less than 2 seconds. No rash noted. He is not diaphoretic. No erythema. No pallor.  Psychiatric: He has a normal mood and affect. His behavior is normal. Judgment and thought content normal.  Nursing note and vitals reviewed.     Assessment & Plan:   1. Healthcare maintenance   2. Acute bronchitis, unspecified organism     Acute bronchitis Please take Augmentin as directed. Please take Tussionex and Albuterol as needed. Increase fluids/rest/vit c-2,000mg  a day when not feeling well. Try to reduce- stop tobacco use- you can do it! If symptoms persist after antibiotic completed, please call clinic.  FOLLOW-UP:  Return if symptoms worsen or fail to improve.

## 2018-08-01 ENCOUNTER — Other Ambulatory Visit: Payer: Self-pay | Admitting: Adult Health

## 2018-08-18 ENCOUNTER — Telehealth: Payer: Self-pay

## 2018-08-18 ENCOUNTER — Other Ambulatory Visit: Payer: Self-pay | Admitting: Adult Health

## 2018-08-18 MED ORDER — AZITHROMYCIN 250 MG PO TABS: ORAL_TABLET | ORAL | 0 refills | Status: DC

## 2018-08-18 MED ORDER — ALBUTEROL SULFATE HFA 108 (90 BASE) MCG/ACT IN AERS: 2.0000 | INHALATION_SPRAY | Freq: Four times a day (QID) | RESPIRATORY_TRACT | 0 refills | Status: DC | PRN

## 2018-08-18 NOTE — Telephone Encounter (Signed)
Patient advised. MPulliam, CMA/RT(R)

## 2018-08-18 NOTE — Telephone Encounter (Signed)
Patient was seen on 07/24/2018 for upper respiratory infection.  Patient was given Augementin and Tussinonex, patient has finished both medications.  States that he started to improve slight but it hit him again.  He is still having productive cough, fatigue, and SOB at times.  Please advise. MPulliam, CMA/RT(R)

## 2018-08-18 NOTE — Telephone Encounter (Signed)
Sent in [place] and ProAir Thanks! Valetta Fuller

## 2018-08-24 NOTE — Progress Notes (Signed)
Subjective:    Patient ID: Brian Craig, male    DOB: [birth year], 67 y.o.   MRN: [id number]  HPI:  08/25/18 OV: [name] is here for regular f/u:  HTN, depression, anxiety.  He is compliant on all medications and denies SE.  He denies CP/dyspnea/palpitations/sig cough.  He continues to smoke 1/2pack and day and again declines smoking cessation.  He drinks 1.5 gallons of decaf/artifically sweetened tea and maybe 1-2 bottles of water.  He follows a "decent diet" and denies formal exercise, however performs house/yard work daily.  He continues to abstain from ETOH, practice AA and will hit 30 years of sobriety 2019.  He was seen by ENT and bx of Parotid gland negative for ca Sept 2018.  02/22/18 OV: [name] presents for CPE He has been following low CHO diet and walking > 58mins/day- he has lost 23 lbs since last OV! He continues to smoke, 1/2 pack/day, he has been using tobacco >50 years  He reports medication compliance, denies SE He reports using PRN Alprazolam "maybe once every other week". He continues to abstain from [facility]! He is followed by cards every 6 months   08/28/18 OV: [name] presents for regular f/u:HTN, depression/anxiety, and obesity He reports medication compliance, denies SE He estimates to smoke 1/2 pack/day, he is still trying to cut back. Lost >40 lbs since 02/2017 with low CHO eating and regular walking. He has been taking Furosemide 40mg - 1/2 tablet, instead of full tablet QD He also has been taking + Chloride 10 meq every other day He continue to have non-productive cough and clear nasal drainage. He completed Augmentin course for bronchitis/sinusitius in mid August. He denies fever/night sweats/N/V/D/CP/palpitations/increase in dyspnea.  Last A1c 02/2018- 5.4  Patient Care Team    Relationship Specialty Notifications Start End  Mina Marble D, NP PCP - General Family Medicine  10/07/17   Brendolyn Patty, MD  Dermatology  02/21/17   Warden Fillers, MD  Consulting Physician Ophthalmology  02/21/17   Amalia Greenhouse, MD Referring Physician Endocrinology  02/21/17   Sanda Klein, MD Consulting Physician Cardiology  02/21/17     Patient Active Problem List   Diagnosis Date Noted  . BMI 39.0-39.9,adult 08/28/2018  . Acute bronchitis 04/25/2018  . Cough in adult 04/25/2018  . Hyperlipidemia 02/22/2018  . Testicular swelling, left 10/10/2017  . HTN (hypertension) 08/25/2017  . Anxiety 08/25/2017  . Healthcare maintenance 08/25/2017  . Localized swelling, mass or lump of neck 06/01/2017  . Vitamin D deficiency 03/04/2017  . Long term current use of anticoagulant 09/20/2016  . Chronic diastolic heart failure ([place]) 02/02/2016  . History of alcohol dependence ([place]) 02/02/2016  . ICD (implantable cardioverter-defibrillator) battery depletion 04/17/2015  . Congestive dilated cardiomyopathy ([place]) 07/15/2014  . Chronic combined systolic and diastolic CHF, NYHA class 1 ([place]) 07/15/2014  . OSA on CPAP 07/15/2014  . Permanent atrial fibrillation ([place]) 07/15/2014  . CHB (complete heart block) ([place]) 07/15/2014  . Biventricular ICD  implanted 2007, generator change in 2012 07/15/2014  . Tobacco abuse 07/15/2014  . Alcoholism in recovery ([place]) 07/15/2014  . Hypothyroidism, postradioiodine therapy 07/15/2014  . Dyslipidemia (high LDL; low HDL) 07/15/2014     Past Medical History:  Diagnosis Date  . Atrial fibrillation ([place])   . CHF (congestive heart failure) ([place])   . Graves disease    Graves  . Hyperlipidemia   . Hypertension   . OSA treated with BiPAP   . Substance abuse ([place])    alcohol  Past Surgical History:  Procedure Laterality Date  . APPENDECTOMY    . BIV ICD GENERTAOR CHANGE OUT Left 2012   Medtronic Protecta  . CARDIAC ELECTROPHYSIOLOGY STUDY AND ABLATION    . EP IMPLANTABLE DEVICE N/A 05/06/2015   Procedure: ICD Generator Changeout;  Surgeon: Sanda Klein, MD;  Location: [place] [facility];  Service: Cardiovascular;   Laterality: N/A;  . HERNIA REPAIR    . MENISCUS REPAIR Right   . VASECTOMY     unilateral due to injury     Family History  Problem Relation Age of Onset  . Heart disease Mother   . Hypertension Mother   . Heart disease Father   . Hyperlipidemia Father   . Hypertension Father   . Heart attack Father   . Heart disease Paternal Aunt   . Heart disease Paternal Uncle   . Alcohol abuse Maternal Uncle   . Alcohol abuse Maternal Grandfather      Social History   Substance and Sexual Activity  Drug Use No     Social History   Substance and Sexual Activity  Alcohol Use No   Comment: recovering alcoholic     Social History   Tobacco Use  Smoking Status Current Every Day Smoker  . Packs/day: 0.50  . Years: 51.00  . Pack years: 25.50  . Types: Cigarettes  Smokeless Tobacco Never Used     Outpatient Encounter Medications as of 08/28/2018  Medication Sig  . acetaminophen (TYLENOL) 325 MG tablet Take 650 mg by mouth every 6 (six) hours as needed for headache.  . albuterol (PROVENTIL HFA;VENTOLIN HFA) 108 (90 Base) MCG/ACT inhaler Inhale 2 puffs into the lungs every 6 (six) hours as needed for wheezing or shortness of breath.  . ALPRAZolam (XANAX) 0.5 MG tablet Take 1 tablet (0.5 mg total) by mouth 3 (three) times daily as needed for anxiety.  . carvedilol (COREG CR) 80 MG 24 hr capsule Take 1 capsule (80 mg total) by mouth daily.  . chlorpheniramine-HYDROcodone (TUSSIONEX) 10-8 MG/5ML SUER Take 5 mLs by mouth every 12 (twelve) hours as needed.  . Cholecalciferol (CVS D3) 2000 units CAPS Take 2,000 Units by mouth daily.  . dabigatran (PRADAXA) 150 MG CAPS capsule Take 1 capsule (150 mg total) by mouth 2 (two) times daily.  . fluticasone (FLONASE) 50 MCG/ACT nasal spray Place 2 sprays into both nostrils daily.  . furosemide (LASIX) 40 MG tablet Take 1 tablet (40 mg total) by mouth daily. (Patient taking differently: Take 40 mg by mouth. Take 1/2 tab qd)  . levothyroxine  (SYNTHROID, LEVOTHROID) 200 MCG tablet Take 200 mcg by mouth daily before breakfast.  . lisinopril (PRINIVIL,ZESTRIL) 10 MG tablet Take 1 tablet (10 mg total) by mouth 2 (two) times daily.  . Multiple Vitamin (MULTIVITAMIN) tablet Take 1 tablet by mouth daily.  . nicotine (NICODERM CQ - DOSED IN MG/24 HOURS) 14 mg/24hr patch Place 1 patch (14 mg total) onto the skin daily.  . potassium chloride (KLOR-CON 10) 10 MEQ tablet Take 1 tablet (10 mEq total) by mouth daily. (Patient taking differently: Take 10 mEq by mouth every other day. )  . sertraline (ZOLOFT) 50 MG tablet TAKE 1 TABLET DAILY  . simvastatin (ZOCOR) 40 MG tablet Take 1 tablet (40 mg total) by mouth daily.  . [DISCONTINUED] ALPRAZolam (XANAX) 0.5 MG tablet Take 1 tablet (0.5 mg total) by mouth 3 (three) times daily as needed for anxiety.  . [DISCONTINUED] amoxicillin-clavulanate (AUGMENTIN) 875-125 MG tablet Take 1 tablet by  mouth 2 (two) times daily.  . [DISCONTINUED] azithromycin (ZITHROMAX) 250 MG tablet 2 tabs day one, 1 tab days 2-5  . [DISCONTINUED] fluticasone (FLONASE) 50 MCG/ACT nasal spray Place 2 sprays into both nostrils daily.   Facility-Administered Encounter Medications as of 08/28/2018  Medication  . ipratropium-albuterol (DUONEB) 0.5-2.5 (3) MG/3ML nebulizer solution 3 mL    Allergies: Patient has no known allergies.  Body mass index is 39.47 kg/m.  Blood pressure 114/74, pulse 86, temperature 97.9 F (36.6 C), temperature source Oral, height 6' (1.829 m), weight 291 lb (132 kg), SpO2 93 %.   Review of Systems  Constitutional: Positive for fatigue. Negative for activity change, appetite change, chills, diaphoresis, fever and unexpected weight change.  HENT: Negative for congestion.   Eyes: Negative for visual disturbance.  Respiratory: Negative for cough, chest tightness, shortness of breath, wheezing and stridor.   Cardiovascular: Negative for chest pain, palpitations and leg swelling.  Gastrointestinal:  Negative for abdominal distention, abdominal pain, blood in stool, constipation, diarrhea, nausea and vomiting.  Endocrine: Negative for cold intolerance, heat intolerance, polydipsia, polyphagia and polyuria.  Genitourinary: Negative for difficulty urinating, flank pain and hematuria.  Musculoskeletal: Negative for arthralgias, back pain, gait problem, joint swelling, myalgias, neck pain and neck stiffness.  Skin: Negative for color change, pallor, rash and wound.  Neurological: Negative for dizziness, tremors, weakness and headaches.  Hematological: Does not bruise/bleed easily.  Psychiatric/Behavioral: Negative for decreased concentration, dysphoric mood, hallucinations, self-injury, sleep disturbance and suicidal ideas. The patient is nervous/anxious. The patient is not hyperactive.        Objective:   Physical Exam  Constitutional: He is oriented to person, place, and time. He appears well-developed and well-nourished. No distress.  HENT:  Head: Normocephalic and atraumatic.  Right Ear: External ear normal. Decreased hearing is noted.  Left Ear: External ear normal. Decreased hearing is noted.  Eyes: Pupils are equal, round, and reactive to light. Conjunctivae are normal.  Cardiovascular: Normal rate, normal heart sounds and intact distal pulses. An irregular rhythm present.  No murmur heard. Pulmonary/Chest: Effort normal and breath sounds normal. No respiratory distress. He has no wheezes. He has no rales. He exhibits no tenderness.  Musculoskeletal: He exhibits no edema.  Neurological: He is alert and oriented to person, place, and time.  Skin: Skin is warm and dry. No rash noted. He is not diaphoretic. No erythema. No pallor.  Psychiatric: He has a normal mood and affect. His behavior is normal. Judgment and thought content normal.  Nursing note and vitals reviewed.     Assessment & Plan:   1. Tobacco abuse   2. Healthcare maintenance   3. Anxiety   4. Hypertension,  unspecified type   5. BMI 39.0-39.9,adult     Healthcare maintenance TREMENDOUS JOB on the weight loss and excellent blood pressure control! Continue to drink plenty of water and follow heart healthy diet.  Please discuss your blood pressure medications with your Cardiologist next week.  With your continues weight loss, you made need adjustments to anti-hypertensives. Please use Nicoderm patch- you can do it! Follow-up in 6 months for complete physical with fasting labs.  Anxiety Mood stable He is using Alprazolam sparingly [place] Controlled Substance Database reviewed- no aberrancies noted. Refill on Alprazolam provided, will complete controlled substance contract at f/u  HTN (hypertension) BP at goal 114/74, HR 86 He has lost >40 lbs since 02/2017 via TLC He has been taking Carvedilol 80mg  QD, Lisinopril 10mg  QD, and has has been halving Furosemide  40mg  the last month. Due to Furosemide reduction he is taking K+ Chloride 10 meq every other day (not daily). He has cards appt next week- he will discuss antihypertensive therapy with them  BMI 39.0-39.9,adult Body mass index is 39.47 kg/m.  Current wt 291 Lost >40 lbs since 02/2017 with low CHO eating and regular walking.  Tobacco abuse Nicoderm rx called in   FOLLOW-UP:  Return in about 6 months (around 02/26/2019) for Fasting Labs, CPE.

## 2018-08-28 ENCOUNTER — Encounter: Payer: Self-pay | Admitting: Adult Health

## 2018-08-28 ENCOUNTER — Ambulatory Visit (INDEPENDENT_AMBULATORY_CARE_PROVIDER_SITE_OTHER): Payer: Medicare Other | Admitting: Adult Health

## 2018-08-28 VITALS — BP 114/74 | HR 86 | Temp 97.9°F | Ht 72.0 in | Wt 291.0 lb

## 2018-08-28 DIAGNOSIS — I10 Essential (primary) hypertension: Secondary | ICD-10-CM

## 2018-08-28 DIAGNOSIS — F419 Anxiety disorder, unspecified: Secondary | ICD-10-CM | POA: Diagnosis not present

## 2018-08-28 DIAGNOSIS — Z6839 Body mass index (BMI) 39.0-39.9, adult: Secondary | ICD-10-CM | POA: Diagnosis not present

## 2018-08-28 DIAGNOSIS — Z6841 Body Mass Index (BMI) 40.0 and over, adult: Secondary | ICD-10-CM | POA: Insufficient documentation

## 2018-08-28 DIAGNOSIS — Z0000 Encounter for general adult medical examination without abnormal findings: Secondary | ICD-10-CM | POA: Diagnosis not present

## 2018-08-28 DIAGNOSIS — Z720 Tobacco use: Secondary | ICD-10-CM

## 2018-08-28 MED ORDER — FLUTICASONE PROPIONATE 50 MCG/ACT NA SUSP: 2.0000 | Freq: Every day | NASAL | 6 refills | Status: DC

## 2018-08-28 MED ORDER — NICOTINE 14 MG/24HR TD PT24: 14.0000 mg | MEDICATED_PATCH | Freq: Every day | TRANSDERMAL | 0 refills | Status: DC

## 2018-08-28 MED ORDER — ALPRAZOLAM 0.5 MG PO TABS: 0.5000 mg | ORAL_TABLET | Freq: Three times a day (TID) | ORAL | 0 refills | Status: DC | PRN

## 2018-08-28 NOTE — Assessment & Plan Note (Addendum)
BP at goal 114/74, HR 86 He has lost >40 lbs since 02/2017 via TLC He has been taking Carvedilol 80mg  QD, Lisinopril 10mg  QD, and has has been halving Furosemide 40mg  the last month. Due to Furosemide reduction he is taking K+ Chloride 10 meq every other day (not daily). He has cards appt next week- he will discuss antihypertensive therapy with them

## 2018-08-28 NOTE — Patient Instructions (Addendum)
Mediterranean Diet A Mediterranean diet refers to food and lifestyle choices that are based on the traditions of countries located on the [place] Sea. This way of eating has been shown to help prevent certain conditions and improve outcomes for people who have chronic diseases, like kidney disease and heart disease. What are tips for following this plan? Lifestyle  Cook and eat meals together with your family, when possible.  Drink enough fluid to keep your urine clear or pale yellow.  Be physically active every day. This includes: ? Aerobic exercise like running or swimming. ? Leisure activities like gardening, walking, or housework.  Get 7-8 hours of sleep each night.  If recommended by your health care provider, drink red wine in moderation. This means 1 glass a day for nonpregnant women and 2 glasses a day for men. A glass of wine equals 5 oz (150 mL). Reading food labels  Check the serving size of packaged foods. For foods such as rice and pasta, the serving size refers to the amount of cooked product, not dry.  Check the total fat in packaged foods. Avoid foods that have saturated fat or trans fats.  Check the ingredients list for added sugars, such as corn syrup. Shopping  At the grocery store, buy most of your food from the areas near the walls of the store. This includes: ? Fresh fruits and vegetables (produce). ? Grains, beans, nuts, and seeds. Some of these may be available in unpackaged forms or large amounts (in bulk). ? Fresh seafood. ? Poultry and eggs. ? Low-fat dairy products.  Buy whole ingredients instead of prepackaged foods.  Buy fresh fruits and vegetables in-season from local farmers markets.  Buy frozen fruits and vegetables in resealable bags.  If you do not have access to quality fresh seafood, buy precooked frozen shrimp or canned fish, such as tuna, salmon, or sardines.  Buy small amounts of raw or cooked vegetables, salads, or olives from the  deli or salad bar at your store.  Stock your pantry so you always have certain foods on hand, such as olive oil, canned tuna, canned tomatoes, rice, pasta, and beans. Cooking  Cook foods with extra-virgin olive oil instead of using butter or other vegetable oils.  Have meat as a side dish, and have vegetables or grains as your main dish. This means having meat in small portions or adding small amounts of meat to foods like pasta or stew.  Use beans or vegetables instead of meat in common dishes like chili or lasagna.  Experiment with different cooking methods. Try roasting or broiling vegetables instead of steaming or sauteing them.  Add frozen vegetables to soups, stews, pasta, or rice.  Add nuts or seeds for added healthy fat at each meal. You can add these to yogurt, salads, or vegetable dishes.  Marinate fish or vegetables using olive oil, lemon juice, garlic, and fresh herbs. Meal planning  Plan to eat 1 vegetarian meal one day each week. Try to work up to 2 vegetarian meals, if possible.  Eat seafood 2 or more times a week.  Have healthy snacks readily available, such as: ? Vegetable sticks with hummus. ? Greek yogurt. ? Fruit and nut trail mix.  Eat balanced meals throughout the week. This includes: ? Fruit: 2-3 servings a day ? Vegetables: 4-5 servings a day ? Low-fat dairy: 2 servings a day ? Fish, poultry, or lean meat: 1 serving a day ? Beans and legumes: 2 or more servings a week ? Nuts   and seeds: 1-2 servings a day ? Whole grains: 6-8 servings a day ? Extra-virgin olive oil: 3-4 servings a day  Limit red meat and sweets to only a few servings a month What are my food choices?  Mediterranean diet ? Recommended ? Grains: Whole-grain pasta. Brown rice. Bulgar wheat. Polenta. Couscous. Whole-wheat bread. [place]. ? Vegetables: Artichokes. Beets. Broccoli. Cabbage. Carrots. Eggplant. Green beans. Chard. Kale. Spinach. Onions. Leeks. Peas. Squash.  Tomatoes. Peppers. Radishes. ? Fruits: Apples. Apricots. Avocado. Berries. Bananas. Cherries. Dates. Figs. Grapes. Lemons. Melon. Oranges. Peaches. Plums. Pomegranate. ? Meats and other protein foods: Beans. Almonds. Sunflower seeds. Pine nuts. Peanuts. [place]. Salmon. Scall[place]. Shrimp. [place]. Tilapia. Clams. Oysters. Eggs. ? Dairy: Low-fat milk. Cheese. Greek yogurt. ? Beverages: Water. Red wine. Herbal tea. ? Fats and oils: Extra virgin olive oil. Avocado oil. Grape seed oil. ? Sweets and desserts: [place] yogurt with honey. Baked apples. Poached pears. Trail mix. ? Seasoning and other foods: Basil. Cilantro. Coriander. Cumin. Mint. Parsley. Sage. Rosemary. Tarragon. Garlic. Oregano. Thyme. Pepper. Balsalmic vinegar. Tahini. Hummus. Tomato sauce. Olives. Mushrooms. ? Limit these ? Grains: Prepackaged pasta or rice dishes. Prepackaged cereal with added sugar. ? Vegetables: Deep fried potatoes (french fries). ? Fruits: Fruit canned in syrup. ? Meats and other protein foods: Beef. Pork. Lamb. Poultry with skin. Hot dogs. [place] Salines. ? Dairy: Ice cream. Sour cream. Whole milk. ? Beverages: Juice. Sugar-sweetened soft drinks. Beer. Liquor and spirits. ? Fats and oils: Butter. Canola oil. Vegetable oil. Beef fat (tallow). Lard. ? Sweets and desserts: Cookies. Cakes. Pies. Candy. ? Seasoning and other foods: Mayonnaise. Premade sauces and marinades. ? The items listed may not be a complete list. Talk with your dietitian about what dietary choices are right for you. Summary  The Mediterranean diet includes both food and lifestyle choices.  Eat a variety of fresh fruits and vegetables, beans, nuts, seeds, and whole grains.  Limit the amount of red meat and sweets that you eat.  Talk with your health care provider about whether it is safe for you to drink red wine in moderation. This means 1 glass a day for nonpregnant women and 2 glasses a day for men. A glass of wine equals 5 oz (150 mL). This information  is not intended to replace advice given to you by your health care provider. Make sure you discuss any questions you have with your health care provider. Document Released: 07/22/2016 Document Revised: 08/24/2016 Document Reviewed: 07/22/2016 Elsevier Interactive Patient Education  2018 [place] on the weight loss and excellent blood pressure control! Continue to drink plenty of water and follow heart healthy diet.  Please discuss your blood pressure medications with your Cardiologist next week.  With your continues weight loss, you made need adjustments to anti-hypertensives. Please use Nicoderm patch- you can do it! Follow-up in 6 months for complete physical with fasting labs. NICE TO SEE YOU!

## 2018-08-28 NOTE — Assessment & Plan Note (Signed)
TREMENDOUS JOB on the weight loss and excellent blood pressure control! Continue to drink plenty of water and follow heart healthy diet.  Please discuss your blood pressure medications with your Cardiologist next week.  With your continues weight loss, you made need adjustments to anti-hypertensives. Please use Nicoderm patch- you can do it! Follow-up in 6 months for complete physical with fasting labs.

## 2018-08-28 NOTE — Assessment & Plan Note (Signed)
Nicoderm rx called in

## 2018-08-28 NOTE — Assessment & Plan Note (Signed)
Mood stable He is using Alprazolam sparingly [place] Controlled Substance Database reviewed- no aberrancies noted. Refill on Alprazolam provided, will complete controlled substance contract at f/u

## 2018-08-28 NOTE — Assessment & Plan Note (Signed)
Body mass index is 39.47 kg/m.  Current wt 291 Lost >40 lbs since 02/2017 with low CHO eating and regular walking.

## 2018-09-04 ENCOUNTER — Ambulatory Visit (INDEPENDENT_AMBULATORY_CARE_PROVIDER_SITE_OTHER): Payer: Medicare Other | Admitting: Cardiovascular Disease

## 2018-09-04 ENCOUNTER — Encounter: Payer: Self-pay | Admitting: Cardiovascular Disease

## 2018-09-04 VITALS — BP 112/74 | HR 92 | Ht 72.0 in | Wt 300.0 lb

## 2018-09-04 DIAGNOSIS — R5383 Other fatigue: Secondary | ICD-10-CM | POA: Diagnosis not present

## 2018-09-04 DIAGNOSIS — I442 Atrioventricular block, complete: Secondary | ICD-10-CM | POA: Diagnosis not present

## 2018-09-04 DIAGNOSIS — Z720 Tobacco use: Secondary | ICD-10-CM | POA: Diagnosis not present

## 2018-09-04 DIAGNOSIS — Z7901 Long term (current) use of anticoagulants: Secondary | ICD-10-CM

## 2018-09-04 DIAGNOSIS — I5042 Chronic combined systolic (congestive) and diastolic (congestive) heart failure: Secondary | ICD-10-CM

## 2018-09-04 DIAGNOSIS — E785 Hyperlipidemia, unspecified: Secondary | ICD-10-CM | POA: Diagnosis not present

## 2018-09-04 DIAGNOSIS — Z79899 Other long term (current) drug therapy: Secondary | ICD-10-CM | POA: Diagnosis not present

## 2018-09-04 DIAGNOSIS — I482 Chronic atrial fibrillation: Secondary | ICD-10-CM | POA: Diagnosis not present

## 2018-09-04 DIAGNOSIS — Z95810 Presence of automatic (implantable) cardiac defibrillator: Secondary | ICD-10-CM | POA: Diagnosis not present

## 2018-09-04 DIAGNOSIS — E890 Postprocedural hypothyroidism: Secondary | ICD-10-CM | POA: Diagnosis not present

## 2018-09-04 DIAGNOSIS — G4733 Obstructive sleep apnea (adult) (pediatric): Secondary | ICD-10-CM

## 2018-09-04 DIAGNOSIS — I4821 Permanent atrial fibrillation: Secondary | ICD-10-CM

## 2018-09-04 NOTE — Patient Instructions (Signed)
Dr Sallyanne Kuster recommends that you continue on your current medications as directed. Please refer to the Current Medication list given to you today.  Your physician recommends that you return for lab work at your convenience - FASTING.  Remote monitoring is used to monitor your Pacemaker or ICD from home. This monitoring reduces the number of office visits required to check your device to one time per year. It allows [place] to keep an eye on the functioning of your device to ensure it is working properly. You are scheduled for a device check from home on Monday, October 14th, 2019. You may send your transmission at any time that day. If you have a wireless device, the transmission will be sent automatically. After your physician reviews your transmission, you will receive a notification with your next transmission date.  To improve our patient care and to more adequately follow your device, [facility] has decided, as a practice, to start following each patient four times a year with your home monitor. This means that you may experience a remote appointment that is close to an in-office appointment with your physician. Your insurance will apply at the same rate as other remote monitoring transmissions.  Dr Sallyanne Kuster recommends that you schedule a follow-up appointment in 12 months with an ICD check. You will receive a reminder letter in the mail two months in advance. If you don't receive a letter, please call our office to schedule the follow-up appointment.  If you need a refill on your cardiac medications before your next appointment, please call your pharmacy.

## 2018-09-05 DIAGNOSIS — Z95810 Presence of automatic (implantable) cardiac defibrillator: Secondary | ICD-10-CM | POA: Insufficient documentation

## 2018-09-05 DIAGNOSIS — E890 Postprocedural hypothyroidism: Secondary | ICD-10-CM | POA: Insufficient documentation

## 2018-09-05 LAB — COMPREHENSIVE METABOLIC PANEL
A/G RATIO: 1.7 (ref 1.2–2.2)
ALBUMIN: 4 g/dL (ref 3.6–4.8)
ALK PHOS: 75 IU/L (ref 39–117)
ALT: 7 IU/L (ref 0–44)
AST: 10 IU/L (ref 0–40)
BILIRUBIN TOTAL: 0.6 mg/dL (ref 0.0–1.2)
BUN / CREAT RATIO: 9 — AB (ref 10–24)
BUN: 9 mg/dL (ref 8–27)
CHLORIDE: 104 mmol/L (ref 96–106)
CO2: 25 mmol/L (ref 20–29)
Calcium: 9.6 mg/dL (ref 8.6–10.2)
Creatinine, Ser: 1.02 mg/dL (ref 0.76–1.27)
GFR calc Af Amer: 88 mL/min/{1.73_m2} (ref >59)
GFR calc non Af Amer: 76 mL/min/{1.73_m2} (ref >59)
GLUCOSE: 93 mg/dL (ref 65–99)
Globulin, Total: 2.4 g/dL (ref 1.5–4.5)
POTASSIUM: 4.3 mmol/L (ref 3.5–5.2)
SODIUM: 143 mmol/L (ref 134–144)
Total Protein: 6.4 g/dL (ref 6.0–8.5)

## 2018-09-05 LAB — LIPID PANEL
Chol/HDL Ratio: 2.4 ratio (ref 0.0–5.0)
Cholesterol, Total: 100 mg/dL (ref 100–199)
HDL: 41 mg/dL (ref >39)
LDL Calculated: 43 mg/dL (ref 0–99)
TRIGLYCERIDES: 79 mg/dL (ref 0–149)
VLDL CHOLESTEROL CAL: 16 mg/dL (ref 5–40)

## 2018-09-05 LAB — CBC
HEMOGLOBIN: 14.1 g/dL (ref 13.0–17.7)
Hematocrit: 42.6 % (ref 37.5–51.0)
MCH: 31.1 pg (ref 26.6–33.0)
MCHC: 33.1 g/dL (ref 31.5–35.7)
MCV: 94 fL (ref 79–97)
Platelets: 223 10*3/uL (ref 150–450)
RBC: 4.53 x10E6/uL (ref 4.14–5.80)
RDW: 13 % (ref 12.3–15.4)
WBC: 7.3 10*3/uL (ref 3.4–10.8)

## 2018-09-05 LAB — TESTOSTERONE: Testosterone: 583 ng/dL (ref 264–916)

## 2018-09-05 LAB — TSH: TSH: 1.57 u[IU]/mL (ref 0.450–4.500)

## 2018-09-05 NOTE — Progress Notes (Signed)
Patient ID: Brian Craig, male   DOB: 09/29/1951, 67 y.o.   MRN: [id number]    Cardiology Office Note    Date:  09/05/2018   ID:  Brian Craig, DOB 1950-12-29, MRN [id number]  PCP:  Brian Grandchild, NP  Cardiologist:   Brian Klein, MD   No chief complaint on file.   History of Present Illness:  Brian Craig is a 67 y.o. male who presents for permanent atrial fibrillation, nonischemic cardiomyopathy, complete heart block status post AV node ablation, biventricular defibrillator check.  Brian Craig has had an uneventful year.  He has not had any serious health challenges.  He has not required hospitalization since his last appointment.  He was developing some problems with dizziness and cut back on his Lasix dose and potassium dose with improvement.  He has not developed edema or shortness of breath after making these changes.  He is now only taking half of a 40 mg tablet of furosemide (20 mg) daily and is taking his potassium chloride 10 mEq every other day.  He specifically denies any chest pain at rest or exertion, dyspnea at rest or with exertion, orthopnea, paroxysmal nocturnal dyspnea, syncope, palpitations, focal neurological deficits, intermittent claudication, lower extremity edema, unexplained weight gain, cough, hemoptysis or wheezing.  Device interrogation shows 98.6 % biventricular pacing and permanent atrial fibrillation with complete heart block.  He has not had any episodes of ventricular tachycardia, either sustained or nonsustained.  Lead parameters are excellent.  As he has done occasionally in the past he had a couple of episodes of brief accelerated idioventricular rhythm around 80 bpm (this happened in January 2017, October 2018 and not since)..  Thoracic impedance has generally been stable, without evidence of meaningful fluid overload (although some fluid gain around Christmas and early June could be attributed to dietary indiscretion).  Seems to be at baseline right now even  on the lower dose of diuretic.  Estimated generator longevity is about 3.8 years.  Activity level remains constant at roughly 2 hours a day.  Brian Craig has a history of severe dilated cardiomyopathy related to uncontrolled atrial fibrillation with rapid ventricular response that started in the setting of thyrotoxicosis. His left ventricular ejection fraction was as low as 17%. Cardioversion failed. Eventually underwent AV node ablation and received a biventricular pacemaker-defibrillator in 2007. He underwent a generator change out in 2012 (atrial lead Medtronic 5076, ICD lead Medtronic [phone] Sprint Quattro secure, LV lead Medtronic 4194 Attain bipolar, Medtronic Viva XT gen change May 2016). His left ventricular ejection fraction has improved substantially since AV node ablation. His most recent echocardiogram performed in August 2015 shows normal left ventricular systolic function. He remains in permanent atrial fibrillation.   Past Medical History:  Diagnosis Date  . Atrial fibrillation (Brian Craig)   . CHF (congestive heart failure) ([place])   . Graves disease    Graves  . Hyperlipidemia   . Hypertension   . OSA treated with BiPAP   . Substance abuse ([place])    alcohol    Past Surgical History:  Procedure Laterality Date  . APPENDECTOMY    . BIV ICD GENERTAOR CHANGE OUT Left 2012   Medtronic Protecta  . CARDIAC ELECTROPHYSIOLOGY STUDY AND ABLATION    . EP IMPLANTABLE DEVICE N/A 05/06/2015   Procedure: ICD Generator Changeout;  Surgeon: Brian Klein, MD;  Location: [facility];  Service: Cardiovascular;  Laterality: N/A;  . HERNIA REPAIR    . MENISCUS REPAIR Right   . VASECTOMY  unilateral due to injury    Outpatient Medications Prior to Visit  Medication Sig Dispense Refill  . acetaminophen (TYLENOL) 325 MG tablet Take 650 mg by mouth every 6 (six) hours as needed for headache.    . albuterol (PROVENTIL HFA;VENTOLIN HFA) 108 (90 Base) MCG/ACT inhaler Inhale 2 puffs into the  lungs every 6 (six) hours as needed for wheezing or shortness of breath. 1 Inhaler 0  . ALPRAZolam (XANAX) 0.5 MG tablet Take 1 tablet (0.5 mg total) by mouth 3 (three) times daily as needed for anxiety. 30 tablet 0  . carvedilol (COREG CR) 80 MG 24 hr capsule Take 1 capsule (80 mg total) by mouth daily. 90 capsule 3  . Cholecalciferol (CVS D3) 2000 units CAPS Take 2,000 Units by mouth daily.    . dabigatran (PRADAXA) 150 MG CAPS capsule Take 1 capsule (150 mg total) by mouth 2 (two) times daily. 180 capsule 3  . fluticasone (FLONASE) 50 MCG/ACT nasal spray Place 2 sprays into both nostrils daily. 16 g 6  . furosemide (LASIX) 40 MG tablet Take 1 tablet (40 mg total) by mouth daily. (Patient taking differently: Take 40 mg by mouth. Take 1/2 tab qd) 90 tablet 3  . levothyroxine (SYNTHROID, LEVOTHROID) 200 MCG tablet Take 200 mcg by mouth daily before breakfast.    . lisinopril (PRINIVIL,ZESTRIL) 10 MG tablet Take 1 tablet (10 mg total) by mouth 2 (two) times daily. 180 tablet 3  . Multiple Vitamin (MULTIVITAMIN) tablet Take 1 tablet by mouth daily.    . potassium chloride (KLOR-CON 10) 10 MEQ tablet Take 1 tablet (10 mEq total) by mouth daily. (Patient taking differently: Take 10 mEq by mouth every other day. ) 90 tablet 3  . sertraline (ZOLOFT) 50 MG tablet TAKE 1 TABLET DAILY 90 tablet 0  . simvastatin (ZOCOR) 40 MG tablet Take 1 tablet (40 mg total) by mouth daily. 90 tablet 3  . chlorpheniramine-HYDROcodone (TUSSIONEX) 10-8 MG/5ML SUER Take 5 mLs by mouth every 12 (twelve) hours as needed. 200 mL 0  . nicotine (NICODERM CQ - DOSED IN MG/24 HOURS) 14 mg/24hr patch Place 1 patch (14 mg total) onto the skin daily. 28 patch 0   Facility-Administered Medications Prior to Visit  Medication Dose Route Frequency Provider Last Rate Last Dose  . ipratropium-albuterol (DUONEB) 0.5-2.5 (3) MG/3ML nebulizer solution 3 mL  3 mL Nebulization Q6H Danford, Katy D, NP   3 mL at 04/25/18 1456     Allergies:    Patient has no known allergies.   Social History   Socioeconomic History  . Marital status: Married    Spouse name: Not on file  . Number of children: Not on file  . Years of education: Not on file  . Highest education level: Not on file  Occupational History  . Not on file  Social Needs  . Financial resource strain: Not on file  . Food insecurity:    Worry: Not on file    Inability: Not on file  . Transportation needs:    Medical: Not on file    Non-medical: Not on file  Tobacco Use  . Smoking status: Current Every Day Smoker    Packs/day: 0.50    Years: 51.00    Pack years: 25.50    Types: Cigarettes  . Smokeless tobacco: Never Used  Substance and Sexual Activity  . Alcohol use: No    Comment: recovering alcoholic  . Drug use: No  . Sexual activity: Yes    Birth  control/protection: None  Lifestyle  . Physical activity:    Days per week: Not on file    Minutes per session: Not on file  . Stress: Not on file  Relationships  . Social connections:    Talks on phone: Not on file    Gets together: Not on file    Attends religious service: Not on file    Active member of club or organization: Not on file    Attends meetings of clubs or organizations: Not on file    Relationship status: Not on file  Other Topics Concern  . Not on file  Social History Narrative  . Not on file     Family History:  The patient's family history includes Alcohol abuse in his maternal grandfather and maternal uncle; Heart attack in his father; Heart disease in his father, mother, paternal aunt, and paternal uncle; Hyperlipidemia in his father; Hypertension in his father and mother.   ROS:   Please see the history of present illness.    ROS All other systems reviewed and are negative.   PHYSICAL EXAM:   VS:  BP 112/74 (BP Location: Left Arm, Patient Position: Sitting, Cuff Size: Large)   Pulse 92   Ht 6' (1.829 m)   Wt 300 lb (136.1 kg)   BMI 40.69 kg/m     General: Alert,  oriented x3, no distress, exam limited by morbid obesity Head: no evidence of trauma, PERRL, EOMI, no exophtalmos or lid lag, no myxedema, no xanthelasma; normal ears, nose and oropharynx Neck: normal jugular venous pulsations and no hepatojugular reflux; brisk carotid pulses without delay and no carotid bruits Chest: clear to auscultation, no signs of consolidation by percussion or palpation, normal fremitus, symmetrical and full respiratory excursions Cardiovascular: normal position and quality of the apical impulse, regular rhythm, normal first and paradoxically split second heart sounds, no murmurs, rubs or gallops, both the left subclavian defibrillator site Abdomen: no tenderness or distention, no masses by palpation, no abnormal pulsatility or arterial bruits, normal bowel sounds, no hepatosplenomegaly Extremities: no clubbing, cyanosis or edema; 2+ radial, ulnar and brachial pulses bilaterally; 2+ right femoral, posterior tibial and dorsalis pedis pulses; 2+ left femoral, posterior tibial and dorsalis pedis pulses; no subclavian or femoral bruits Neurological: grossly nonfocal Psych: Normal mood and affect  Wt Readings from Last 3 Encounters:  09/04/18 300 lb (136.1 kg)  08/28/18 291 lb (132 kg)  07/24/18 300 lb (136.1 kg)      Studies/Labs Reviewed:   EKG:  EKG is ordered today.  The ekg ordered today demonstrates atrial fibrillation, 100% ventricular paced with positive R wave in lead V1 consistent with effective LV pacing  Recent Labs: 09/04/2018: ALT 7; BUN 9; Creatinine, Ser 1.02; Hemoglobin 14.1; Platelets 223; Potassium 4.3; Sodium 143; TSH 1.570   Lipid Panel    Component Value Date/Time   CHOL 100 09/04/2018 1111   TRIG 79 09/04/2018 1111   HDL 41 09/04/2018 1111   CHOLHDL 2.4 09/04/2018 1111   CHOLHDL 3.6 02/09/2017 1103   VLDL 25 02/09/2017 1103   LDLCALC 43 09/04/2018 1111      ASSESSMENT:    1. CHB (complete heart block) ([place])   2. Permanent atrial  fibrillation ([place])   3. Long term current use of anticoagulant   4. Biventricular automatic implantable cardioverter defibrillator in situ   5. Chronic combined systolic and diastolic CHF, NYHA class 1 ([place])   6. OSA (obstructive sleep apnea)   7. Morbid obesity ([place])  8. Tobacco abuse   9. Dyslipidemia   10. Postablative hypothyroidism   11. Other fatigue   12. Medication management       PLAN:  In order of problems listed above:  1. CHB: No escape rhythm, pacemaker dependent following AV node ablation. 2. AFib: Imminent atrial fibrillation. No history of stroke or other embolic events. CHADSvasc 2 (age, CHF).  Compliant with anticoagulation. 3. Pradaxa no overt bleeding.  Check CBC because of his complaints of fatigue. 4. CRT-D: Comprehensive check with a normal device function today.  Continue remote downloads every 3 months and yearly office visit.. Remote downloads every 3 months and yearly office visit. 5. CHF: Clinically bulimic, confirmed by thoracic impedance levels, even after decreasing his dose of diuretics.  Good functional status.  Last assessment echo in 2015 showed normalization of systolic function with EF 60-65%. 6. OSA on CPAP: Reports 100% compliance with CPAP and reports clinical improvement with this therapy. 7. Obesity: Reiterated recommendations for him to lose weight. 8. Tobacco abuse: Smokes less than a pack of cigarettes a month.  Not interested in quitting. 9. Dyslipidemia (high LDL; low HDL): Despite being very inactive and morbidly obese his HDL has actually improved since last year and is now in acceptable range.  (Labs were checked today) 10. Hypothyroidism, postradioiodine therapy: Check TSH in view of his complaints of fatigue.  We will also check testosterone level for the same reason. 11. Alcoholism in recovery: Has been abstinent for many years.   Medication Adjustments/Labs and Tests Ordered: Current medicines are reviewed at length with the  patient today.  Concerns regarding medicines are outlined above.  Medication changes, Labs and Tests ordered today are listed in the Patient Instructions below. Patient Instructions  Dr Sallyanne Kuster recommends that you continue on your current medications as directed. Please refer to the Current Medication list given to you today.  Your physician recommends that you return for lab work at your convenience - FASTING.  Remote monitoring is used to monitor your Pacemaker or ICD from home. This monitoring reduces the number of office visits required to check your device to one time per year. It allows [place] to keep an eye on the functioning of your device to ensure it is working properly. You are scheduled for a device check from home on Monday, October 14th, 2019. You may send your transmission at any time that day. If you have a wireless device, the transmission will be sent automatically. After your physician reviews your transmission, you will receive a notification with your next transmission date.  To improve our patient care and to more adequately follow your device, [facility] has decided, as a practice, to start following each patient four times a year with your home monitor. This means that you may experience a remote appointment that is close to an in-office appointment with your physician. Your insurance will apply at the same rate as other remote monitoring transmissions.  Dr Sallyanne Kuster recommends that you schedule a follow-up appointment in 12 months with an ICD check. You will receive a reminder letter in the mail two months in advance. If you don't receive a letter, please call our office to schedule the follow-up appointment.  If you need a refill on your cardiac medications before your next appointment, please call your pharmacy.      Signed, Brian Klein, MD  09/05/2018 4:32 PM    [facility] [place], [place],   [address] Phone: [phone]; Fax:  [phone]    

## 2018-09-18 ENCOUNTER — Other Ambulatory Visit: Payer: Self-pay | Admitting: Cardiovascular Disease

## 2018-09-25 ENCOUNTER — Ambulatory Visit (INDEPENDENT_AMBULATORY_CARE_PROVIDER_SITE_OTHER): Payer: Medicare Other | Admitting: *Deleted

## 2018-09-25 DIAGNOSIS — I420 Dilated cardiomyopathy: Secondary | ICD-10-CM | POA: Diagnosis not present

## 2018-09-26 NOTE — Progress Notes (Signed)
Remote ICD transmission.   

## 2018-09-28 ENCOUNTER — Encounter: Payer: Self-pay | Admitting: Cardiology

## 2018-10-08 ENCOUNTER — Other Ambulatory Visit: Payer: Self-pay | Admitting: Adult Health

## 2018-10-17 ENCOUNTER — Other Ambulatory Visit: Payer: Self-pay | Admitting: Cardiovascular Disease

## 2018-10-17 NOTE — Telephone Encounter (Signed)
Rx request sent to pharmacy.  

## 2018-10-20 LAB — CUP PACEART REMOTE DEVICE CHECK
Battery Voltage: 2.97 V
Brady Statistic AP VP Percent: 0 %
Brady Statistic AP VS Percent: 0 %
Brady Statistic AS VS Percent: 2.29 %
Brady Statistic RV Percent Paced: 96.35 %
HIGH POWER IMPEDANCE MEASURED VALUE: 41 Ohm
HighPow Impedance: 53 Ohm
Implantable Lead Implant Date: 20071217
Implantable Lead Implant Date: 20071217
Implantable Lead Location: 753858
Implantable Lead Model: 6947
Implantable Pulse Generator Implant Date: 20160524
Lead Channel Impedance Value: 285 Ohm
Lead Channel Impedance Value: 418 Ohm
Lead Channel Impedance Value: 608 Ohm
Lead Channel Pacing Threshold Amplitude: 0.625 V
Lead Channel Sensing Intrinsic Amplitude: 4.375 mV
Lead Channel Sensing Intrinsic Amplitude: 4.375 mV
Lead Channel Setting Pacing Amplitude: 2 V
Lead Channel Setting Pacing Pulse Width: 0.8 ms
MDC IDC LEAD IMPLANT DT: 20071217
MDC IDC LEAD LOCATION: 753859
MDC IDC LEAD LOCATION: 753860
MDC IDC MSMT BATTERY REMAINING LONGEVITY: 45 mo
MDC IDC MSMT LEADCHNL LV IMPEDANCE VALUE: 475 Ohm
MDC IDC MSMT LEADCHNL LV PACING THRESHOLD PULSEWIDTH: 0.8 ms
MDC IDC MSMT LEADCHNL RV IMPEDANCE VALUE: 285 Ohm
MDC IDC MSMT LEADCHNL RV IMPEDANCE VALUE: 342 Ohm
MDC IDC MSMT LEADCHNL RV PACING THRESHOLD AMPLITUDE: 0.875 V
MDC IDC MSMT LEADCHNL RV PACING THRESHOLD PULSEWIDTH: 0.4 ms
MDC IDC SESS DTM: 20191014082503
MDC IDC SET LEADCHNL RV PACING AMPLITUDE: 2.5 V
MDC IDC SET LEADCHNL RV PACING PULSEWIDTH: 0.4 ms
MDC IDC SET LEADCHNL RV SENSING SENSITIVITY: 0.3 mV
MDC IDC STAT BRADY AS VP PERCENT: 97.71 %
MDC IDC STAT BRADY RA PERCENT PACED: 0 %

## 2018-11-14 DIAGNOSIS — E039 Hypothyroidism, unspecified: Secondary | ICD-10-CM | POA: Diagnosis not present

## 2018-12-18 ENCOUNTER — Telehealth: Payer: Self-pay | Admitting: Adult Health

## 2018-12-18 MED ORDER — SERTRALINE HCL 50 MG PO TABS: 50.0000 mg | ORAL_TABLET | Freq: Every day | ORAL | 0 refills | Status: DC

## 2018-12-18 NOTE — Telephone Encounter (Signed)
Patient is requesting a refill of his Zoloft, if approved please send to [facility].

## 2018-12-18 NOTE — Addendum Note (Signed)
Addended by: Fonnie Mu on: 12/18/2018 10:58 AM   Modules accepted: Orders

## 2018-12-25 ENCOUNTER — Ambulatory Visit (INDEPENDENT_AMBULATORY_CARE_PROVIDER_SITE_OTHER): Payer: Medicare Other

## 2018-12-25 DIAGNOSIS — I442 Atrioventricular block, complete: Secondary | ICD-10-CM

## 2018-12-25 DIAGNOSIS — I420 Dilated cardiomyopathy: Secondary | ICD-10-CM

## 2018-12-26 NOTE — Progress Notes (Signed)
Remote ICD transmission.   

## 2018-12-27 LAB — CUP PACEART REMOTE DEVICE CHECK
Battery Voltage: 2.97 V
Brady Statistic AP VP Percent: 0 %
Brady Statistic AP VS Percent: 0 %
Brady Statistic AS VP Percent: 97.59 %
Brady Statistic AS VS Percent: 2.41 %
Brady Statistic RA Percent Paced: 0 %
Brady Statistic RV Percent Paced: 95.63 %
Date Time Interrogation Session: 20200113111806
HIGH POWER IMPEDANCE MEASURED VALUE: 42 Ohm
HighPow Impedance: 55 Ohm
Implantable Lead Implant Date: 20071217
Implantable Lead Implant Date: 20071217
Implantable Lead Implant Date: 20071217
Implantable Lead Location: 753858
Implantable Lead Location: 753859
Implantable Lead Location: 753860
Implantable Lead Model: 4194
Implantable Lead Model: 5076
Implantable Lead Model: 6947
Implantable Pulse Generator Implant Date: 20160524
Lead Channel Impedance Value: 247 Ohm
Lead Channel Impedance Value: 304 Ohm
Lead Channel Impedance Value: 342 Ohm
Lead Channel Impedance Value: 418 Ohm
Lead Channel Impedance Value: 513 Ohm
Lead Channel Impedance Value: 646 Ohm
Lead Channel Pacing Threshold Amplitude: 0.625 V
Lead Channel Pacing Threshold Pulse Width: 0.8 ms
Lead Channel Sensing Intrinsic Amplitude: 4.375 mV
Lead Channel Sensing Intrinsic Amplitude: 4.375 mV
Lead Channel Setting Pacing Amplitude: 1.75 V
Lead Channel Setting Pacing Pulse Width: 0.8 ms
Lead Channel Setting Sensing Sensitivity: 0.3 mV
MDC IDC MSMT BATTERY REMAINING LONGEVITY: 42 mo
MDC IDC MSMT LEADCHNL RV PACING THRESHOLD AMPLITUDE: 0.625 V
MDC IDC MSMT LEADCHNL RV PACING THRESHOLD PULSEWIDTH: 0.4 ms
MDC IDC SET LEADCHNL RV PACING AMPLITUDE: 2.5 V
MDC IDC SET LEADCHNL RV PACING PULSEWIDTH: 0.4 ms

## 2019-01-09 ENCOUNTER — Telehealth: Payer: Self-pay | Admitting: Adult Health

## 2019-01-09 NOTE — Telephone Encounter (Signed)
LVM informing pt that OV will be required prior to any RXs to treat.  Advised pt that if he cannot make it into the office during normal business hours, he should be seen at [facility].  Charyl Bigger, CMA

## 2019-01-09 NOTE — Telephone Encounter (Signed)
Patient called stating he has a left ear infection. I advised patient that he will probably need an OV for any med to be called in for this, but he stated that he has started a new job and is in training all this week so coming in for an [place] would be very difficult. He is hoping Valetta Fuller will take this into consideration and send any appropriate meds that could help him out to his pharmacy.

## 2019-01-10 ENCOUNTER — Encounter: Payer: Self-pay | Admitting: Adult Health

## 2019-01-10 ENCOUNTER — Ambulatory Visit (INDEPENDENT_AMBULATORY_CARE_PROVIDER_SITE_OTHER): Payer: Medicare Other | Admitting: Adult Health

## 2019-01-10 DIAGNOSIS — H6690 Otitis media, unspecified, unspecified ear: Secondary | ICD-10-CM | POA: Diagnosis not present

## 2019-01-10 MED ORDER — AMOXICILLIN-POT CLAVULANATE 875-125 MG PO TABS: 1.0000 | ORAL_TABLET | Freq: Two times a day (BID) | ORAL | 0 refills | Status: DC

## 2019-01-10 NOTE — Assessment & Plan Note (Signed)
Please take Augmentin as directed. Remain well hydrated. Alternate OTC Acetaminophen and Ibuprofen as needed for fever/discomfort. If symptoms persist after antibiotic completed, please call clinic. Reduce-stop tobacco use!

## 2019-01-10 NOTE — Patient Instructions (Addendum)

## 2019-01-10 NOTE — Progress Notes (Signed)
Subjective:    Patient ID: Brian Craig, male    DOB: May 01, 1951, 68 y.o.   MRN: [id number]  HPI:  Mr. Mitrano presents with L otalgia, L ear "fullness", thick/green nasal drainage that began >3 days ago. He denies cough/fever/N/V/D He denies CP/increase in dyspnea/dizziness/palpitations He continues to smoke 1/2 pack/day- again declined smoking cessation  Patient Care Team    Relationship Specialty Notifications Start End  Mina Marble D, NP PCP - General Family Medicine  10/07/17   Brendolyn Patty, MD  Dermatology  02/21/17   Warden Fillers, MD Consulting Physician Ophthalmology  02/21/17   Amalia Greenhouse, MD Referring Physician Endocrinology  02/21/17   Sanda Klein, MD Consulting Physician Cardiology  02/21/17     Patient Active Problem List   Diagnosis Date Noted  . Otitis media 01/10/2019  . Biventricular automatic implantable cardioverter defibrillator in situ 09/05/2018  . Postablative hypothyroidism 09/05/2018  . BMI 39.0-39.9,adult 08/28/2018  . Acute bronchitis 04/25/2018  . Cough in adult 04/25/2018  . Hyperlipidemia 02/22/2018  . Testicular swelling, left 10/10/2017  . HTN (hypertension) 08/25/2017  . Anxiety 08/25/2017  . Healthcare maintenance 08/25/2017  . Localized swelling, mass or lump of neck 06/01/2017  . Vitamin D deficiency 03/04/2017  . Long term current use of anticoagulant 09/20/2016  . Chronic diastolic heart failure ([place]) 02/02/2016  . History of alcohol dependence ([place]) 02/02/2016  . ICD (implantable cardioverter-defibrillator) battery depletion 04/17/2015  . Congestive dilated cardiomyopathy ([place]) 07/15/2014  . Chronic combined systolic and diastolic CHF, NYHA class 1 ([place]) 07/15/2014  . OSA on CPAP 07/15/2014  . Permanent atrial fibrillation 07/15/2014  . CHB (complete heart block) ([place]) 07/15/2014  . Biventricular ICD  implanted 2007, generator change in 2012 07/15/2014  . Tobacco abuse 07/15/2014  . Alcoholism in recovery ([place]) 07/15/2014   . Hypothyroidism, postradioiodine therapy 07/15/2014  . Dyslipidemia (high LDL; low HDL) 07/15/2014     Past Medical History:  Diagnosis Date  . Atrial fibrillation ([place])   . CHF (congestive heart failure) ([place])   . Graves disease    Graves  . Hyperlipidemia   . Hypertension   . OSA treated with BiPAP   . Substance abuse ([place])    alcohol     Past Surgical History:  Procedure Laterality Date  . APPENDECTOMY    . BIV ICD GENERTAOR CHANGE OUT Left 2012   Medtronic Protecta  . CARDIAC ELECTROPHYSIOLOGY STUDY AND ABLATION    . EP IMPLANTABLE DEVICE N/A 05/06/2015   Procedure: ICD Generator Changeout;  Surgeon: Sanda Klein, MD;  Location: [facility];  Service: Cardiovascular;  Laterality: N/A;  . HERNIA REPAIR    . MENISCUS REPAIR Right   . VASECTOMY     unilateral due to injury     Family History  Problem Relation Age of Onset  . Heart disease Mother   . Hypertension Mother   . Heart disease Father   . Hyperlipidemia Father   . Hypertension Father   . Heart attack Father   . Heart disease Paternal Aunt   . Heart disease Paternal Uncle   . Alcohol abuse Maternal Uncle   . Alcohol abuse Maternal Grandfather      Social History   Substance and Sexual Activity  Drug Use No     Social History   Substance and Sexual Activity  Alcohol Use No   Comment: recovering alcoholic     Social History   Tobacco Use  Smoking Status Current Every Day Smoker  . Packs/day:  0.46  . Years: 51.00  . Pack years: 25.50  . Types: Cigarettes  Smokeless Tobacco Never Used     Outpatient Encounter Medications as of 01/10/2019  Medication Sig  . acetaminophen (TYLENOL) 325 MG tablet Take 650 mg by mouth every 6 (six) hours as needed for headache.  . ALPRAZolam (XANAX) 0.5 MG tablet Take 1 tablet (0.5 mg total) by mouth 3 (three) times daily as needed for anxiety.  . carvedilol (COREG CR) 80 MG 24 hr capsule TAKE 1 CAPSULE DAILY  . Cholecalciferol (CVS D3) 2000  units CAPS Take 2,000 Units by mouth daily.  . furosemide (LASIX) 40 MG tablet TAKE 1 TABLET DAILY  . levothyroxine (SYNTHROID, LEVOTHROID) 200 MCG tablet Take 200 mcg by mouth daily before breakfast.  . lisinopril (PRINIVIL,ZESTRIL) 10 MG tablet TAKE 1 TABLET TWICE A DAY  . Multiple Vitamin (MULTIVITAMIN) tablet Take 1 tablet by mouth daily.  . potassium chloride (K-DUR) 10 MEQ tablet TAKE 1 TABLET DAILY  . PRADAXA 150 MG CAPS capsule TAKE 1 CAPSULE TWICE DAILY  . sertraline (ZOLOFT) 50 MG tablet Take 1 tablet (50 mg total) by mouth daily.  . simvastatin (ZOCOR) 40 MG tablet TAKE 1 TABLET DAILY  . amoxicillin-clavulanate (AUGMENTIN) 875-125 MG tablet Take 1 tablet by mouth 2 (two) times daily.  . [DISCONTINUED] albuterol (PROVENTIL HFA;VENTOLIN HFA) 108 (90 Base) MCG/ACT inhaler Inhale 2 puffs into the lungs every 6 (six) hours as needed for wheezing or shortness of breath.  . [DISCONTINUED] fluticasone (FLONASE) 50 MCG/ACT nasal spray Place 2 sprays into both nostrils daily.   Facility-Administered Encounter Medications as of 01/10/2019  Medication  . ipratropium-albuterol (DUONEB) 0.5-2.5 (3) MG/3ML nebulizer solution 3 mL    Allergies: Patient has no known allergies.  Body mass index is 41.76 kg/m.  Blood pressure 122/77, pulse 85, temperature 98 F (36.7 C), temperature source Oral, height 6' (1.829 m), weight (!) 307 lb 14.4 oz (139.7 kg), SpO2 98 %.  Review of Systems  Constitutional: Positive for fatigue. Negative for activity change, appetite change, chills, diaphoresis, fever and unexpected weight change.  HENT: Positive for congestion, ear pain, facial swelling, postnasal drip, rhinorrhea, sinus pressure and sneezing. Negative for ear discharge, sinus pain, sore throat, trouble swallowing and voice change.   Eyes: Negative for visual disturbance.  Respiratory: Negative for cough, chest tightness, shortness of breath and wheezing.   Cardiovascular: Negative for chest pain,  palpitations and leg swelling.  Endocrine: Negative for cold intolerance, heat intolerance, polydipsia, polyphagia and polyuria.  Skin: Negative for color change, pallor, rash and wound.  Neurological: Negative for dizziness and headaches.  Hematological: Does not bruise/bleed easily.       Objective:   Physical Exam Vitals signs and nursing note reviewed.  Constitutional:      General: He is not in acute distress.    Appearance: He is obese. He is not ill-appearing, toxic-appearing or diaphoretic.  HENT:     Head: Normocephalic and atraumatic.     Right Ear: No decreased hearing noted. Tympanic membrane is bulging. Tympanic membrane is not erythematous.     Left Ear: Decreased hearing noted. Tenderness present. Tympanic membrane is erythematous and bulging.     Nose:     Right Turbinates: Swollen.     Left Turbinates: Swollen.     Right Sinus: No maxillary sinus tenderness or frontal sinus tenderness.     Left Sinus: No maxillary sinus tenderness or frontal sinus tenderness.     Mouth/Throat:     Pharynx:  Posterior oropharyngeal erythema present. No oropharyngeal exudate.     Tonsils: Swelling: 1+ on the right. 1+ on the left.  Cardiovascular:     Rate and Rhythm: Normal rate.     Pulses: Normal pulses.     Heart sounds: Normal heart sounds.  Pulmonary:     Effort: Pulmonary effort is normal. No respiratory distress.     Breath sounds: Normal breath sounds. No stridor. No wheezing, rhonchi or rales.  Chest:     Chest wall: No tenderness.  Skin:    General: Skin is warm and dry.     Capillary Refill: Capillary refill takes less than 2 seconds.  Neurological:     Mental Status: He is alert and oriented to person, place, and time.  Psychiatric:        Mood and Affect: Mood normal.        Behavior: Behavior normal.        Thought Content: Thought content normal.        Judgment: Judgment normal.       Assessment & Plan:   1. Acute otitis media, unspecified otitis media  type     Otitis media Please take Augmentin as directed. Remain well hydrated. Alternate OTC Acetaminophen and Ibuprofen as needed for fever/discomfort. If symptoms persist after antibiotic completed, please call clinic. Reduce-stop tobacco use!    FOLLOW-UP:  Return if symptoms worsen or fail to improve.

## 2019-01-15 IMAGING — CT CT CHEST LUNG CANCER SCREENING LOW DOSE W/O CM
1 of 2 series · 10 of 20 positions shown, 13 images · non-contrast
Comparison: No comparison studies available.

CLINICAL DATA: 66-year-old male with 38 pack-year history of
smoking. Lung cancer screening.

EXAM:
CT CHEST WITHOUT CONTRAST LOW-DOSE FOR LUNG CANCER SCREENING
TECHNIQUE: Multidetector CT imaging of the chest was performed following the
standard protocol without IV contrast.

[ct lung segmentation data · axial · 0.84mm/px · z∈[-187,-187]mm · 10 of 335 frames shown]
[frame 1/335  mediastinal]
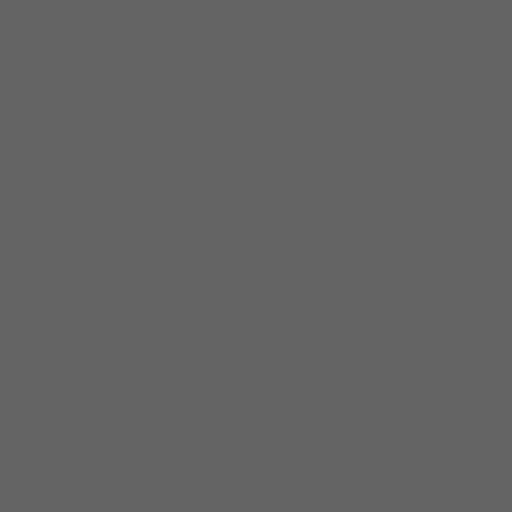
[frame 1/335  lung]
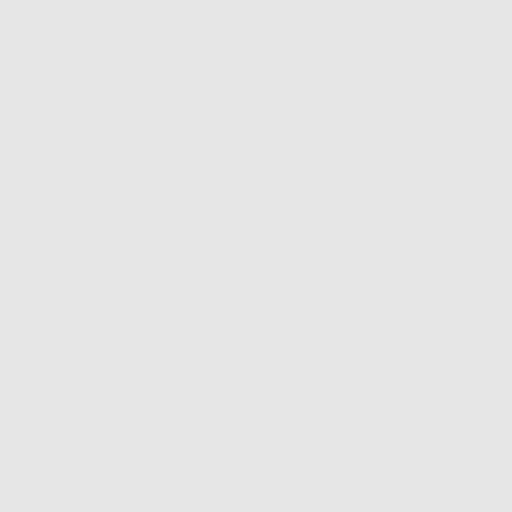
[frame 38/335  lung]
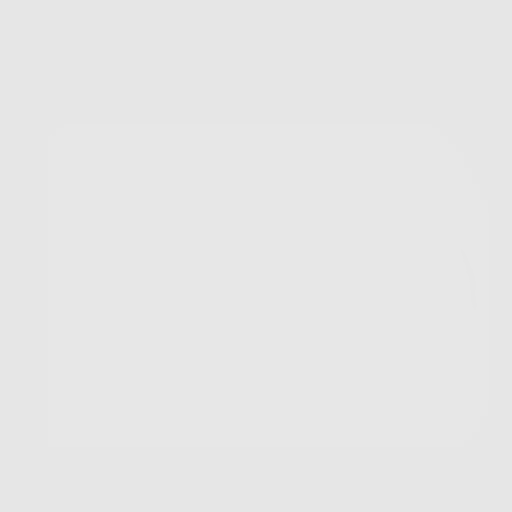
[frame 75/335  lung]
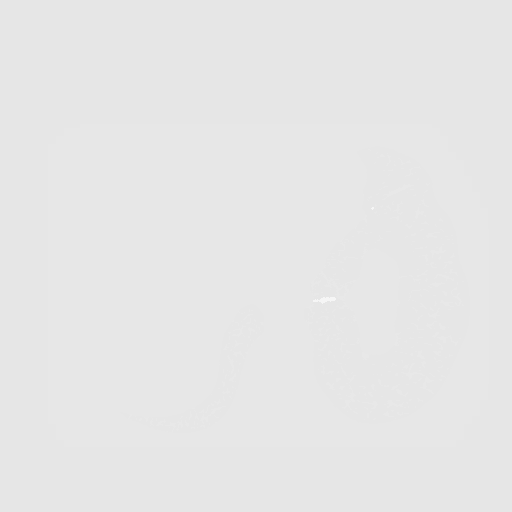
[frame 112/335  lung]
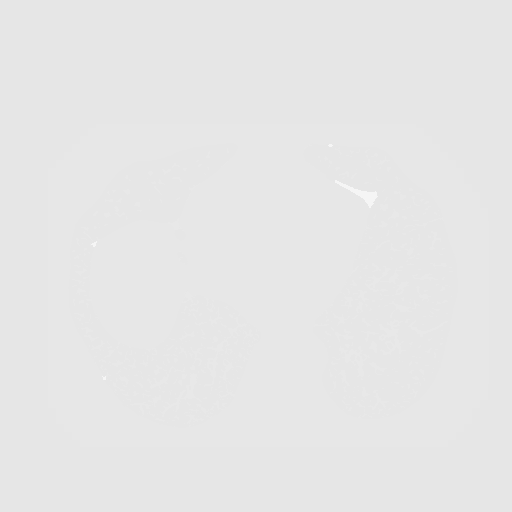
[frame 149/335  mediastinal]
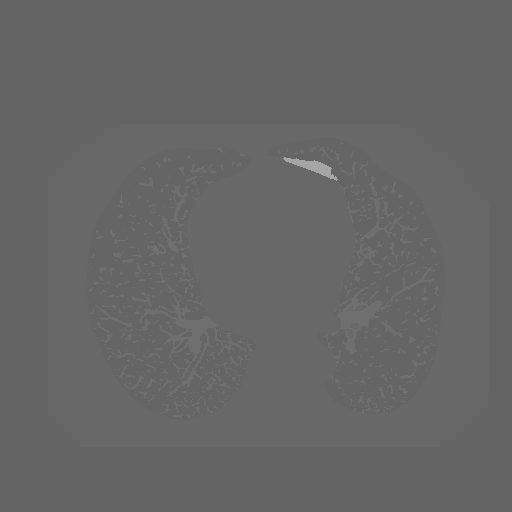
[frame 149/335  lung]
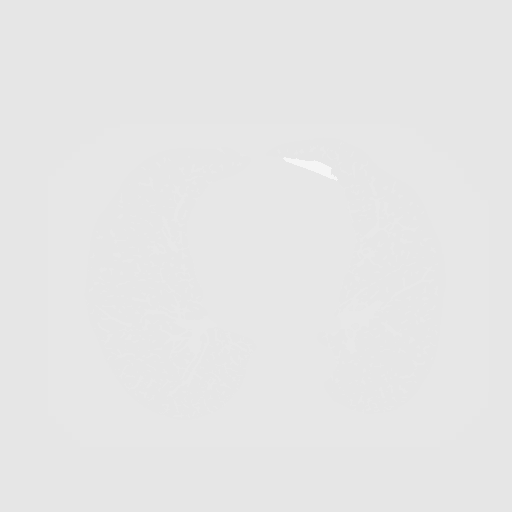
[frame 186/335  lung]
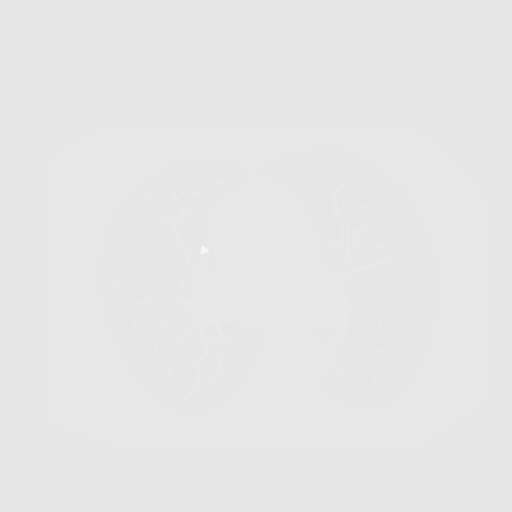
[frame 223/335  lung]
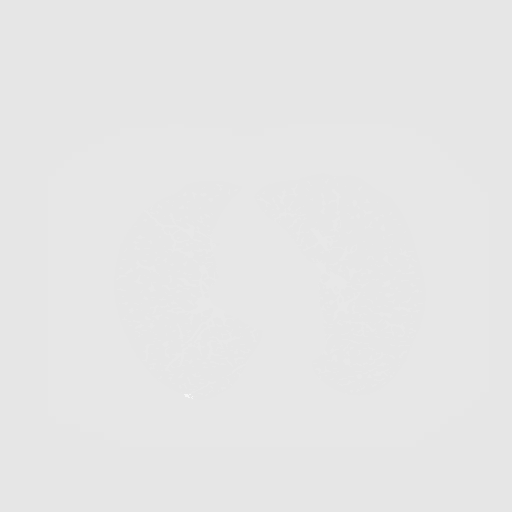
[frame 260/335  lung]
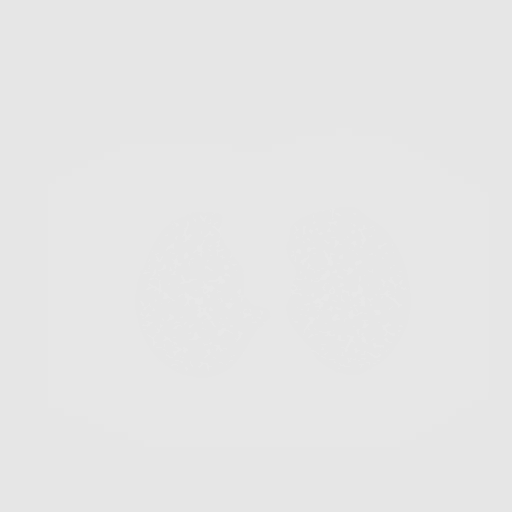
[frame 297/335  mediastinal]
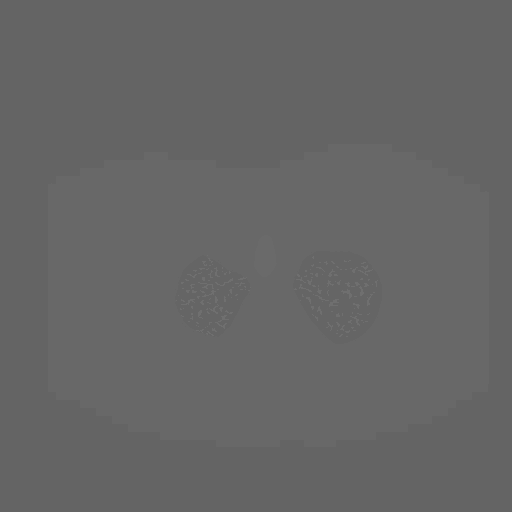
[frame 297/335  lung]
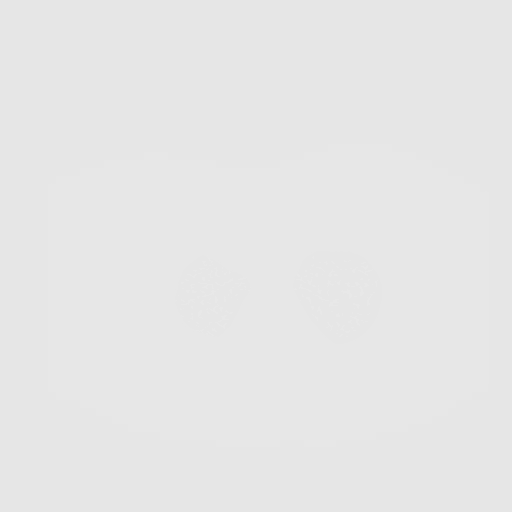
[frame 335/335  lung]
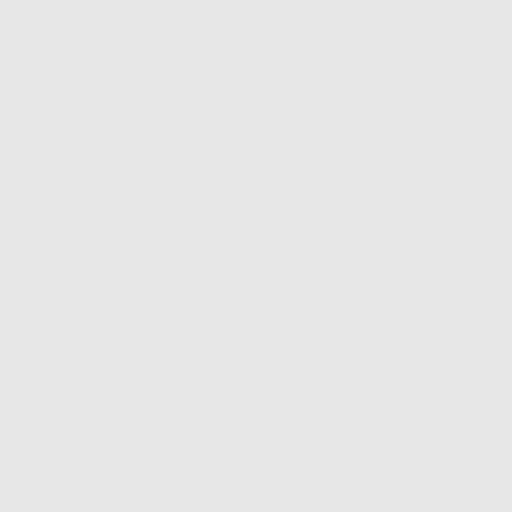

[10 of 20 positions shown; findings below may reference images not displayed]

FINDINGS: Cardiovascular: Heart size normal. Coronary artery calcification is
evident. Atherosclerotic calcification is noted in the wall of the
thoracic aorta. Left pacer/AICD noted.

Mediastinum/Nodes: No mediastinal lymphadenopathy. No evidence for
gross hilar lymphadenopathy although assessment is limited by the
lack of intravenous contrast on today's study. The esophagus has
normal imaging features. There is no axillary lymphadenopathy.

Lungs/Pleura: Centrilobular emphysema evident. Tiny left pulmonary
nodule demonstrates volume derived equivalent diameter of 2.7 mm. No
overtly suspicious nodule or mass. No focal airspace consolidation.
No pulmonary edema or pleural effusion.

Upper Abdomen: Multiple calcified gallstones evident measuring
around 2 cm diameter otherwise unremarkable.

Musculoskeletal: Bone windows reveal no worrisome lytic or sclerotic
osseous lesions.
IMPRESSION: 1. Lung-RADS 2, benign appearance or behavior. Continue annual
screening with low-dose chest CT without contrast in 12 months.
2. Cholelithiasis.
3.  Emphysema. (PW9UC-AV4.8)
4.  Aortic Atherosclerois (PW9UC-170.0)

## 2019-01-16 LAB — CUP PACEART INCLINIC DEVICE CHECK
Date Time Interrogation Session: 20200204154541
Implantable Lead Implant Date: 20071217
Implantable Lead Implant Date: 20071217
Implantable Lead Implant Date: 20071217
Implantable Lead Location: 753858
Implantable Lead Location: 753859
Implantable Lead Location: 753860
Implantable Lead Model: 4194
Implantable Lead Model: 6947
Implantable Pulse Generator Implant Date: 20160524

## 2019-02-20 ENCOUNTER — Encounter: Payer: Medicare Other | Admitting: Adult Health

## 2019-02-20 ENCOUNTER — Other Ambulatory Visit: Payer: Medicare Other

## 2019-02-20 ENCOUNTER — Other Ambulatory Visit: Payer: Self-pay

## 2019-02-20 DIAGNOSIS — E890 Postprocedural hypothyroidism: Secondary | ICD-10-CM

## 2019-02-20 DIAGNOSIS — E785 Hyperlipidemia, unspecified: Secondary | ICD-10-CM | POA: Diagnosis not present

## 2019-02-20 DIAGNOSIS — Z0000 Encounter for general adult medical examination without abnormal findings: Secondary | ICD-10-CM

## 2019-02-20 DIAGNOSIS — I10 Essential (primary) hypertension: Secondary | ICD-10-CM

## 2019-02-20 DIAGNOSIS — R7309 Other abnormal glucose: Secondary | ICD-10-CM

## 2019-02-20 DIAGNOSIS — Z7901 Long term (current) use of anticoagulants: Secondary | ICD-10-CM | POA: Diagnosis not present

## 2019-02-21 LAB — HEMOGLOBIN A1C
Est. average glucose Bld gHb Est-mCnc: 111 mg/dL
Hgb A1c MFr Bld: 5.5 % (ref 4.8–5.6)

## 2019-02-21 LAB — COMPREHENSIVE METABOLIC PANEL
A/G RATIO: 1.7 (ref 1.2–2.2)
ALT: 11 IU/L (ref 0–44)
AST: 14 IU/L (ref 0–40)
Albumin: 4 g/dL (ref 3.8–4.8)
Alkaline Phosphatase: 77 IU/L (ref 39–117)
BUN / CREAT RATIO: 11 (ref 10–24)
BUN: 12 mg/dL (ref 8–27)
Bilirubin Total: 0.3 mg/dL (ref 0.0–1.2)
CO2: 26 mmol/L (ref 20–29)
Calcium: 8.8 mg/dL (ref 8.6–10.2)
Chloride: 105 mmol/L (ref 96–106)
Creatinine, Ser: 1.06 mg/dL (ref 0.76–1.27)
GFR calc Af Amer: 84 mL/min/{1.73_m2} (ref >59)
GFR calc non Af Amer: 72 mL/min/{1.73_m2} (ref >59)
GLOBULIN, TOTAL: 2.3 g/dL (ref 1.5–4.5)
Glucose: 87 mg/dL (ref 65–99)
Potassium: 4.1 mmol/L (ref 3.5–5.2)
Sodium: 146 mmol/L — ABNORMAL HIGH (ref 134–144)
Total Protein: 6.3 g/dL (ref 6.0–8.5)

## 2019-02-21 LAB — CBC WITH DIFFERENTIAL/PLATELET
BASOS: 1 %
Basophils Absolute: 0.1 10*3/uL (ref 0.0–0.2)
EOS (ABSOLUTE): 0.2 10*3/uL (ref 0.0–0.4)
Eos: 2 %
Hematocrit: 40.6 % (ref 37.5–51.0)
Hemoglobin: 14.1 g/dL (ref 13.0–17.7)
Immature Grans (Abs): 0 10*3/uL (ref 0.0–0.1)
Immature Granulocytes: 0 %
Lymphocytes Absolute: 2.8 10*3/uL (ref 0.7–3.1)
Lymphs: 32 %
MCH: 32.6 pg (ref 26.6–33.0)
MCHC: 34.7 g/dL (ref 31.5–35.7)
MCV: 94 fL (ref 79–97)
Monocytes Absolute: 0.7 10*3/uL (ref 0.1–0.9)
Monocytes: 8 %
Neutrophils Absolute: 4.8 10*3/uL (ref 1.4–7.0)
Neutrophils: 57 %
Platelets: 211 10*3/uL (ref 150–450)
RBC: 4.32 x10E6/uL (ref 4.14–5.80)
RDW: 11.7 % (ref 11.6–15.4)
WBC: 8.6 10*3/uL (ref 3.4–10.8)

## 2019-02-21 LAB — LIPID PANEL
Chol/HDL Ratio: 3.1 ratio (ref 0.0–5.0)
Cholesterol, Total: 95 mg/dL — ABNORMAL LOW (ref 100–199)
HDL: 31 mg/dL — ABNORMAL LOW (ref >39)
LDL Calculated: 43 mg/dL (ref 0–99)
Triglycerides: 104 mg/dL (ref 0–149)
VLDL Cholesterol Cal: 21 mg/dL (ref 5–40)

## 2019-02-21 LAB — TSH: TSH: 1.55 u[IU]/mL (ref 0.450–4.500)

## 2019-02-21 NOTE — Progress Notes (Deleted)
Subjective:    Patient ID: Brian Craig, male    DOB: [birth year], 68 y.o.   MRN: [id number]  HPI :  Mr. Nolasco is here for CPE  Reviewed Recent Labs: TSH-WNL, 1.550 Lipid Panel- Stable A1c-WL, 5.5 CMP-stable CBC-stable  Healthcare Maintenance: Colonoscopy- Immunizations- AAA Screening- LDCT-  Patient Care Team    Relationship Specialty Notifications Start End  Mina Marble D, NP PCP - General Family Medicine  10/07/17   Brendolyn Patty, MD  Dermatology  02/21/17   Warden Fillers, MD Consulting Physician Ophthalmology  02/21/17   Amalia Greenhouse, MD Referring Physician Endocrinology  02/21/17   Sanda Klein, MD Consulting Physician Cardiology  02/21/17     Patient Active Problem List   Diagnosis Date Noted  . Otitis media 01/10/2019  . Biventricular automatic implantable cardioverter defibrillator in situ 09/05/2018  . Postablative hypothyroidism 09/05/2018  . BMI 39.0-39.9,adult 08/28/2018  . Acute bronchitis 04/25/2018  . Cough in adult 04/25/2018  . Hyperlipidemia 02/22/2018  . Testicular swelling, left 10/10/2017  . HTN (hypertension) 08/25/2017  . Anxiety 08/25/2017  . Healthcare maintenance 08/25/2017  . Localized swelling, mass or lump of neck 06/01/2017  . Vitamin D deficiency 03/04/2017  . Long term current use of anticoagulant 09/20/2016  . Chronic diastolic heart failure ([place]) 02/02/2016  . History of alcohol dependence ([place]) 02/02/2016  . ICD (implantable cardioverter-defibrillator) battery depletion 04/17/2015  . Congestive dilated cardiomyopathy ([place]) 07/15/2014  . Chronic combined systolic and diastolic CHF, NYHA class 1 ([place]) 07/15/2014  . OSA on CPAP 07/15/2014  . Permanent atrial fibrillation 07/15/2014  . CHB (complete heart block) ([place]) 07/15/2014  . Biventricular ICD  implanted 2007, generator change in 2012 07/15/2014  . Tobacco abuse 07/15/2014  . Alcoholism in recovery ([place]) 07/15/2014  . Hypothyroidism, postradioiodine therapy  07/15/2014  . Dyslipidemia (high LDL; low HDL) 07/15/2014     Past Medical History:  Diagnosis Date  . Atrial fibrillation ([place])   . CHF (congestive heart failure) ([place])   . Graves disease    Graves  . Hyperlipidemia   . Hypertension   . OSA treated with BiPAP   . Substance abuse ([place])    alcohol     Past Surgical History:  Procedure Laterality Date  . APPENDECTOMY    . BIV ICD GENERTAOR CHANGE OUT Left 2012   Medtronic Protecta  . CARDIAC ELECTROPHYSIOLOGY STUDY AND ABLATION    . EP IMPLANTABLE DEVICE N/A 05/06/2015   Procedure: ICD Generator Changeout;  Surgeon: Sanda Klein, MD;  Location: [facility];  Service: Cardiovascular;  Laterality: N/A;  . HERNIA REPAIR    . MENISCUS REPAIR Right   . VASECTOMY     unilateral due to injury     Family History  Problem Relation Age of Onset  . Heart disease Mother   . Hypertension Mother   . Heart disease Father   . Hyperlipidemia Father   . Hypertension Father   . Heart attack Father   . Heart disease Paternal Aunt   . Heart disease Paternal Uncle   . Alcohol abuse Maternal Uncle   . Alcohol abuse Maternal Grandfather      Social History   Substance and Sexual Activity  Drug Use No     Social History   Substance and Sexual Activity  Alcohol Use No   Comment: recovering alcoholic     Social History   Tobacco Use  Smoking Status Current Every Day Smoker  . Packs/day: 0.50  . Years: 51.00  .  Pack years: 25.50  . Types: Cigarettes  Smokeless Tobacco Never Used     Outpatient Encounter Medications as of 02/26/2019  Medication Sig  . acetaminophen (TYLENOL) 325 MG tablet Take 650 mg by mouth every 6 (six) hours as needed for headache.  . ALPRAZolam (XANAX) 0.5 MG tablet Take 1 tablet (0.5 mg total) by mouth 3 (three) times daily as needed for anxiety.  Marland Kitchen amoxicillin-clavulanate (AUGMENTIN) 875-125 MG tablet Take 1 tablet by mouth 2 (two) times daily.  . carvedilol (COREG CR) 80 MG 24 hr  capsule TAKE 1 CAPSULE DAILY  . Cholecalciferol ([employer] D3) 2000 units CAPS Take 2,000 Units by mouth daily.  . furosemide (LASIX) 40 MG tablet TAKE 1 TABLET DAILY  . levothyroxine (SYNTHROID, LEVOTHROID) 200 MCG tablet Take 200 mcg by mouth daily before breakfast.  . lisinopril (PRINIVIL,ZESTRIL) 10 MG tablet TAKE 1 TABLET TWICE A DAY  . Multiple Vitamin (MULTIVITAMIN) tablet Take 1 tablet by mouth daily.  . potassium chloride (K-DUR) 10 MEQ tablet TAKE 1 TABLET DAILY  . PRADAXA 150 MG CAPS capsule TAKE 1 CAPSULE TWICE DAILY  . sertraline (ZOLOFT) 50 MG tablet Take 1 tablet (50 mg total) by mouth daily.  . simvastatin (ZOCOR) 40 MG tablet TAKE 1 TABLET DAILY   Facility-Administered Encounter Medications as of 02/26/2019  Medication  . ipratropium-albuterol (DUONEB) 0.5-2.5 (3) MG/3ML nebulizer solution 3 mL    Allergies: Patient has no known allergies.  There is no height or weight on file to calculate BMI.  There were no vitals taken for this visit.   Review of Systems     Objective:   Physical Exam        Assessment & Plan:  No diagnosis found.  No problem-specific Assessment & Plan notes found for this encounter.    FOLLOW-UP:  No follow-ups on file.

## 2019-02-26 ENCOUNTER — Encounter: Payer: Medicare Other | Admitting: Adult Health

## 2019-03-18 ENCOUNTER — Other Ambulatory Visit: Payer: Self-pay | Admitting: Adult Health

## 2019-03-19 ENCOUNTER — Encounter: Payer: Self-pay | Admitting: Adult Health

## 2019-03-19 ENCOUNTER — Ambulatory Visit (INDEPENDENT_AMBULATORY_CARE_PROVIDER_SITE_OTHER): Payer: Medicare Other | Admitting: Adult Health

## 2019-03-19 ENCOUNTER — Other Ambulatory Visit: Payer: Self-pay

## 2019-03-19 DIAGNOSIS — F419 Anxiety disorder, unspecified: Secondary | ICD-10-CM

## 2019-03-19 DIAGNOSIS — Z0000 Encounter for general adult medical examination without abnormal findings: Secondary | ICD-10-CM | POA: Diagnosis not present

## 2019-03-19 MED ORDER — ALPRAZOLAM 0.5 MG PO TABS: 0.5000 mg | ORAL_TABLET | Freq: Three times a day (TID) | ORAL | 0 refills | Status: DC | PRN

## 2019-03-19 MED ORDER — SERTRALINE HCL 50 MG PO TABS: 50.0000 mg | ORAL_TABLET | Freq: Every day | ORAL | 1 refills | Status: DC

## 2019-03-19 NOTE — Assessment & Plan Note (Signed)
Assessment and Plan: Continue all medications as directed. Continue to remain well hydrated and follow heart healthy diet. [place] Controlled Substance Database reviewed- no aberrancies noted Sertraline and Alprazolam refills sent in  Follow Up Instructions: 6 month f/u in clinic    I discussed the assessment and treatment plan with the patient. The patient was provided an opportunity to ask questions and all were answered. The patient agreed with the plan and demonstrated an understanding of the instructions.   The patient was advised to call back or seek an in-person evaluation if the symptoms worsen or if the condition fails to improve as anticipated.  I provided 8 minutes of non-face-to-face time during this encounter.

## 2019-03-19 NOTE — Telephone Encounter (Signed)
Pt needs telemedicine visit prior to refilling prescription.  LVM for pt to call to discuss.  Charyl Bigger, CMA

## 2019-03-19 NOTE — Assessment & Plan Note (Signed)
F/u 6 months

## 2019-03-19 NOTE — Progress Notes (Signed)
Virtual Visit via Telephone Note  I connected with Brian Craig on 03/19/19 at  1:15 PM EDT by telephone and verified that I am speaking with the correct person using two identifiers.   I discussed the limitations, risks, security and privacy concerns of performing an evaluation and management service by telephone and the availability of in person appointments. The staff discussed with the patient that there may be a patient responsible charge related to this service. The patient expressed understanding and agreed to proceed.   History of Present Illness: Brian Craig calls in today to discuss GAD He reports mood stable on Sertraline 50mg  QD and PRN Alprazolam 0.5mg - estimates to use 1-2 tabs per month He denies SI/HI He is now working remotely for [employer], states "it has been crazy busy" He estimates to drink >gallon water/day He has been trying to follow heart healthy diet. HE denies regular exercise, but reports yesterday he mowed two fields yesterday Home BP readings: SBP 110-120s DBP 70s He denies CP/dyspnea/HA/dizziness/palpitations/fever/cough He has been remaining at home as much as possible with his wife    Patient Care Team    Relationship Specialty Notifications Start End  Brian Craig D, NP PCP - General Family Medicine  10/07/17   Brian Patty, MD  Dermatology  02/21/17   Brian Fillers, MD Consulting Physician Ophthalmology  02/21/17   Brian Greenhouse, MD Referring Physician Endocrinology  02/21/17   Brian Klein, MD Consulting Physician Cardiology  02/21/17     Patient Active Problem List   Diagnosis Date Noted  . Otitis media 01/10/2019  . Biventricular automatic implantable cardioverter defibrillator in situ 09/05/2018  . Postablative hypothyroidism 09/05/2018  . BMI 39.0-39.9,adult 08/28/2018  . Acute bronchitis 04/25/2018  . Cough in adult 04/25/2018  . Hyperlipidemia 02/22/2018  . Testicular swelling, left 10/10/2017  . HTN (hypertension) 08/25/2017  .  Anxiety 08/25/2017  . Healthcare maintenance 08/25/2017  . Localized swelling, mass or lump of neck 06/01/2017  . Vitamin D deficiency 03/04/2017  . Long term current use of anticoagulant 09/20/2016  . Chronic diastolic heart failure (Brian Craig) 02/02/2016  . History of alcohol dependence (Brian Craig) 02/02/2016  . ICD (implantable cardioverter-defibrillator) battery depletion 04/17/2015  . Congestive dilated cardiomyopathy (Brian Craig) 07/15/2014  . Chronic combined systolic and diastolic CHF, NYHA class 1 ([place]) 07/15/2014  . OSA on CPAP 07/15/2014  . Permanent atrial fibrillation 07/15/2014  . CHB (complete heart block) (Brian Craig) 07/15/2014  . Biventricular ICD  implanted 2007, generator change in 2012 07/15/2014  . Tobacco abuse 07/15/2014  . Alcoholism in recovery (Brian Craig) 07/15/2014  . Hypothyroidism, postradioiodine therapy 07/15/2014  . Dyslipidemia (high LDL; low HDL) 07/15/2014     Past Medical History:  Diagnosis Date  . Atrial fibrillation (Brian Craig)   . CHF (congestive heart failure) (Brian Craig)   . Graves disease    Graves  . Hyperlipidemia   . Hypertension   . OSA treated with BiPAP   . Substance abuse (Brian Craig)    alcohol     Past Surgical History:  Procedure Laterality Date  . APPENDECTOMY    . BIV ICD GENERTAOR CHANGE OUT Left 2012   Medtronic Protecta  . CARDIAC ELECTROPHYSIOLOGY STUDY AND ABLATION    . EP IMPLANTABLE DEVICE N/A 05/06/2015   Procedure: ICD Generator Changeout;  Surgeon: Brian Klein, MD;  Location: [facility];  Service: Cardiovascular;  Laterality: N/A;  . HERNIA REPAIR    . MENISCUS REPAIR Right   . VASECTOMY     unilateral due to injury  Family History  Problem Relation Age of Onset  . Heart disease Mother   . Hypertension Mother   . Heart disease Father   . Hyperlipidemia Father   . Hypertension Father   . Heart attack Father   . Heart disease Paternal Aunt   . Heart disease Paternal Uncle   . Alcohol abuse Maternal Uncle   . Alcohol abuse Maternal  Grandfather      Social History   Substance and Sexual Activity  Drug Use No     Social History   Substance and Sexual Activity  Alcohol Use No   Comment: recovering alcoholic     Social History   Tobacco Use  Smoking Status Current Every Day Smoker  . Packs/day: 0.50  . Years: 51.00  . Pack years: 25.50  . Types: Cigarettes  Smokeless Tobacco Never Used     Outpatient Encounter Medications as of 03/19/2019  Medication Sig  . acetaminophen (TYLENOL) 325 MG tablet Take 650 mg by mouth every 6 (six) hours as needed for headache.  . ALPRAZolam (XANAX) 0.5 MG tablet Take 1 tablet (0.5 mg total) by mouth 3 (three) times daily as needed for anxiety.  . carvedilol (COREG CR) 80 MG 24 hr capsule TAKE 1 CAPSULE DAILY  . Cholecalciferol (CVS D3) 2000 units CAPS Take 2,000 Units by mouth daily.  . furosemide (LASIX) 40 MG tablet TAKE 1 TABLET DAILY (Patient taking differently: Take 20 mg by mouth daily. )  . levothyroxine (SYNTHROID, LEVOTHROID) 200 MCG tablet Take 200 mcg by mouth daily before breakfast.  . lisinopril (PRINIVIL,ZESTRIL) 10 MG tablet TAKE 1 TABLET TWICE A DAY  . Multiple Vitamin (MULTIVITAMIN) tablet Take 1 tablet by mouth daily.  . potassium chloride (K-DUR) 10 MEQ tablet TAKE 1 TABLET DAILY (Patient taking differently: Take 10 mEq by mouth every other day. )  . PRADAXA 150 MG CAPS capsule TAKE 1 CAPSULE TWICE DAILY  . sertraline (ZOLOFT) 50 MG tablet Take 1 tablet (50 mg total) by mouth daily.  . simvastatin (ZOCOR) 40 MG tablet TAKE 1 TABLET DAILY  . [DISCONTINUED] ALPRAZolam (XANAX) 0.5 MG tablet Take 1 tablet (0.5 mg total) by mouth 3 (three) times daily as needed for anxiety.  . [DISCONTINUED] sertraline (ZOLOFT) 50 MG tablet Take 1 tablet (50 mg total) by mouth daily.  . [DISCONTINUED] amoxicillin-clavulanate (AUGMENTIN) 875-125 MG tablet Take 1 tablet by mouth 2 (two) times daily.   Facility-Administered Encounter Medications as of 03/19/2019  Medication   . ipratropium-albuterol (DUONEB) 0.5-2.5 (3) MG/3ML nebulizer solution 3 mL    Allergies: Patient has no known allergies.  Body mass index is 40.52 kg/m.  Blood pressure 126/77, pulse 75, temperature (!) 96.9 F (36.1 C), temperature source Oral, height 6' (1.829 m), weight 298 lb 12.8 oz (135.5 kg).    Observations/Objective: No acute distress noted over the telephone  Assessment and Plan: Continue all medications as directed. Continue to remain well hydrated and follow heart healthy diet. [place] Controlled Substance Database reviewed- no aberrancies noted Sertraline and Alprazolam refills sent in  Follow Up Instructions: 6 month f/u in clinic    I discussed the assessment and treatment plan with the patient. The patient was provided an opportunity to ask questions and all were answered. The patient agreed with the plan and demonstrated an understanding of the instructions.   The patient was advised to call back or seek an in-person evaluation if the symptoms worsen or if the condition fails to improve as anticipated.  I provided  8 minutes of non-face-to-face time during this encounter.   Esaw Grandchild, NP

## 2019-03-26 ENCOUNTER — Other Ambulatory Visit: Payer: Self-pay

## 2019-03-26 ENCOUNTER — Ambulatory Visit (INDEPENDENT_AMBULATORY_CARE_PROVIDER_SITE_OTHER): Payer: Medicare Other | Admitting: *Deleted

## 2019-03-26 DIAGNOSIS — I442 Atrioventricular block, complete: Secondary | ICD-10-CM

## 2019-03-26 DIAGNOSIS — I5032 Chronic diastolic (congestive) heart failure: Secondary | ICD-10-CM

## 2019-03-26 LAB — CUP PACEART REMOTE DEVICE CHECK
Battery Remaining Longevity: 40 mo
Battery Voltage: 2.96 V
Brady Statistic AP VP Percent: 0 %
Brady Statistic AP VS Percent: 0 %
Brady Statistic AS VP Percent: 97.19 %
Brady Statistic AS VS Percent: 2.81 %
Brady Statistic RA Percent Paced: 0 %
Brady Statistic RV Percent Paced: 95.45 %
Date Time Interrogation Session: 20200413062603
HighPow Impedance: 38 Ohm
HighPow Impedance: 53 Ohm
Implantable Lead Implant Date: 20071217
Implantable Lead Implant Date: 20071217
Implantable Lead Implant Date: 20071217
Implantable Lead Location: 753858
Implantable Lead Location: 753859
Implantable Lead Location: 753860
Implantable Lead Model: 4194
Implantable Lead Model: 5076
Implantable Lead Model: 6947
Implantable Pulse Generator Implant Date: 20160524
Lead Channel Impedance Value: 228 Ohm
Lead Channel Impedance Value: 285 Ohm
Lead Channel Impedance Value: 304 Ohm
Lead Channel Impedance Value: 361 Ohm
Lead Channel Impedance Value: 456 Ohm
Lead Channel Impedance Value: 589 Ohm
Lead Channel Pacing Threshold Amplitude: 0.625 V
Lead Channel Pacing Threshold Amplitude: 0.75 V
Lead Channel Pacing Threshold Pulse Width: 0.4 ms
Lead Channel Pacing Threshold Pulse Width: 0.8 ms
Lead Channel Sensing Intrinsic Amplitude: 4.375 mV
Lead Channel Sensing Intrinsic Amplitude: 4.375 mV
Lead Channel Setting Pacing Amplitude: 1.75 V
Lead Channel Setting Pacing Amplitude: 2.5 V
Lead Channel Setting Pacing Pulse Width: 0.4 ms
Lead Channel Setting Pacing Pulse Width: 0.8 ms
Lead Channel Setting Sensing Sensitivity: 0.3 mV

## 2019-04-05 NOTE — Progress Notes (Signed)
Remote ICD transmission.   

## 2019-04-09 ENCOUNTER — Ambulatory Visit (INDEPENDENT_AMBULATORY_CARE_PROVIDER_SITE_OTHER): Payer: Medicare Other | Admitting: Adult Health

## 2019-04-09 ENCOUNTER — Other Ambulatory Visit: Payer: Self-pay

## 2019-04-09 ENCOUNTER — Encounter: Payer: Self-pay | Admitting: Adult Health

## 2019-04-09 VITALS — BP 132/74 | HR 78 | Temp 97.8°F | Ht 72.0 in | Wt 297.6 lb

## 2019-04-09 DIAGNOSIS — Z114 Encounter for screening for human immunodeficiency virus [HIV]: Secondary | ICD-10-CM

## 2019-04-09 DIAGNOSIS — J439 Emphysema, unspecified: Secondary | ICD-10-CM

## 2019-04-09 DIAGNOSIS — Z0000 Encounter for general adult medical examination without abnormal findings: Secondary | ICD-10-CM | POA: Insufficient documentation

## 2019-04-09 DIAGNOSIS — Z1159 Encounter for screening for other viral diseases: Secondary | ICD-10-CM | POA: Diagnosis not present

## 2019-04-09 NOTE — Assessment & Plan Note (Signed)
Continue all medications as directed Remain well hydrated, follow heart healthy diet Increase daily walking F/u OV 6 months COVID-19 Education: Signs and symptoms of COVID-19 infection were discussed with pt and how to seek care for testing.  The importance of following the Stay at Home order, and when out- Social Distancing and wearing a facial mask were discussed today. I have personally reviewed and noted the following in the patient's chart:   . Medical and social history . Use of alcohol, tobacco or illicit drugs  . Current medications and supplements . Functional ability and status . Nutritional status . Physical activity . Advanced directives . List of other physicians . Hospitalizations, surgeries, and ER visits in previous 12 months . Vitals . Screenings to include cognitive, depression, and falls . Referrals and appointments  In addition, I have reviewed and discussed with patient certain preventive protocols, quality metrics, and best practice recommendations. A written personalized care plan for preventive services as well as general preventive health recommendations were provided to patient.   I discussed the assessment and treatment plan with the patient. The patient was provided an opportunity to ask questions and all were answered. The patient agreed with the plan and demonstrated an understanding of the instructions.   The patient was advised to call back or seek an in-person evaluation if the symptoms worsen or if the condition fails to improve as anticipated.

## 2019-04-09 NOTE — Progress Notes (Signed)
Virtual Visit via Telephone Note  I connected with Brian Craig on 04/09/19 at  1:15 PM EDT by telephone and verified that I am speaking with the correct person using two identifiers.   I discussed the limitations, risks, security and privacy concerns of performing an evaluation and management service by telephone and the availability of in person appointments. The staff discussed with the patient that there may be a patient responsible charge related to this service. The patient expressed understanding and agreed to proceed.  Location of Patient- Home Location of Provider- In Clinic    Subjective:   Brian Craig is a 68 y.o. male who presents for Medicare Annual/Subsequent preventive examination.  Review of Systems: General:   No F/C, wt loss Pulm:   No DIB, SOB, pleuritic chest pain Card:  No CP, palpitations Abd:  No n/v/d or pain Ext:  No inc edema from baseline This patient does not have sx concerning for COVID-19 Infection (ie; fever, chills, cough, new or worsening shortness of breath).  Objective:    Vitals: BP 132/74   Pulse 78   Temp 97.8 F (36.6 C) (Oral)   Ht 6' (1.829 m)   Wt 297 lb 9.6 oz (135 kg)   BMI 40.36 kg/m   Body mass index is 40.36 kg/m.  Advanced Directives 07/19/2017 05/12/2017 02/21/2017 05/06/2015  Does Patient Have a Medical Advance Directive? No No No No  Would patient like information on creating a medical advance directive? Yes (Inpatient - patient requests chaplain consult to create a medical advance directive);No - Patient declined Yes (MAU/Ambulatory/Procedural Areas - Information given) Yes (MAU/Ambulatory/Procedural Areas - Information given) No - patient declined information    Tobacco Social History   Tobacco Use  Smoking Status Current Every Day Smoker  . Packs/day: 0.50  . Years: 51.00  . Pack years: 25.50  . Types: Cigarettes  Smokeless Tobacco Never Used     Ready to quit: Not Answered Counseling given: Not Answered    Past  Medical History:  Diagnosis Date  . Atrial fibrillation ([place])   . CHF (congestive heart failure) ([place])   . Graves disease    Graves  . Hyperlipidemia   . Hypertension   . OSA treated with BiPAP   . Substance abuse ([place])    alcohol   Past Surgical History:  Procedure Laterality Date  . APPENDECTOMY    . BIV ICD GENERTAOR CHANGE OUT Left 2012   Medtronic Protecta  . CARDIAC ELECTROPHYSIOLOGY STUDY AND ABLATION    . EP IMPLANTABLE DEVICE N/A 05/06/2015   Procedure: ICD Generator Changeout;  Surgeon: Sanda Klein, MD;  Location: [facility];  Service: Cardiovascular;  Laterality: N/A;  . HERNIA REPAIR    . MENISCUS REPAIR Right   . VASECTOMY     unilateral due to injury   Family History  Problem Relation Age of Onset  . Heart disease Mother   . Hypertension Mother   . Heart disease Father   . Hyperlipidemia Father   . Hypertension Father   . Heart attack Father   . Heart disease Paternal Aunt   . Heart disease Paternal Uncle   . Alcohol abuse Maternal Uncle   . Alcohol abuse Maternal Grandfather    Social History   Socioeconomic History  . Marital status: Married    Spouse name: Not on file  . Number of children: Not on file  . Years of education: Not on file  . Highest education level: Not on file  Occupational  History  . Not on file  Social Needs  . Financial resource strain: Not on file  . Food insecurity:    Worry: Not on file    Inability: Not on file  . Transportation needs:    Medical: Not on file    Non-medical: Not on file  Tobacco Use  . Smoking status: Current Every Day Smoker    Packs/day: 0.50    Years: 51.00    Pack years: 25.50    Types: Cigarettes  . Smokeless tobacco: Never Used  Substance and Sexual Activity  . Alcohol use: No    Comment: recovering alcoholic  . Drug use: No  . Sexual activity: Yes    Birth control/protection: None  Lifestyle  . Physical activity:    Days per week: Not on file    Minutes per session: Not on  file  . Stress: Not on file  Relationships  . Social connections:    Talks on phone: Not on file    Gets together: Not on file    Attends religious service: Not on file    Active member of club or organization: Not on file    Attends meetings of clubs or organizations: Not on file    Relationship status: Not on file  Other Topics Concern  . Not on file  Social History Narrative  . Not on file    Outpatient Encounter Medications as of 04/09/2019  Medication Sig  . acetaminophen (TYLENOL) 325 MG tablet Take 650 mg by mouth every 6 (six) hours as needed for headache.  . ALPRAZolam (XANAX) 0.5 MG tablet Take 1 tablet (0.5 mg total) by mouth 3 (three) times daily as needed for anxiety.  . carvedilol (COREG CR) 80 MG 24 hr capsule TAKE 1 CAPSULE DAILY  . Cholecalciferol (CVS D3) 2000 units CAPS Take 2,000 Units by mouth daily.  . furosemide (LASIX) 40 MG tablet Take 20 mg by mouth daily.  Marland Kitchen levothyroxine (SYNTHROID, LEVOTHROID) 200 MCG tablet Take 200 mcg by mouth daily before breakfast.  . lisinopril (PRINIVIL,ZESTRIL) 10 MG tablet TAKE 1 TABLET TWICE A DAY  . Multiple Vitamin (MULTIVITAMIN) tablet Take 1 tablet by mouth daily.  . potassium chloride (K-DUR) 10 MEQ tablet Take 10 mEq by mouth every other day.  Marland Kitchen PRADAXA 150 MG CAPS capsule TAKE 1 CAPSULE TWICE DAILY  . sertraline (ZOLOFT) 50 MG tablet Take 1 tablet (50 mg total) by mouth daily.  . simvastatin (ZOCOR) 40 MG tablet TAKE 1 TABLET DAILY  . [DISCONTINUED] furosemide (LASIX) 40 MG tablet TAKE 1 TABLET DAILY (Patient taking differently: Take 20 mg by mouth daily. )  . [DISCONTINUED] potassium chloride (K-DUR) 10 MEQ tablet TAKE 1 TABLET DAILY (Patient taking differently: Take 10 mEq by mouth every other day. )  . [DISCONTINUED] ipratropium-albuterol (DUONEB) 0.5-2.5 (3) MG/3ML nebulizer solution 3 mL    No facility-administered encounter medications on file as of 04/09/2019.     Activities of Daily Living In your present state  of health, do you have any difficulty performing the following activities: 04/09/2019  Hearing? N  Vision? N  Difficulty concentrating or making decisions? N  Walking or climbing stairs? N  Dressing or bathing? N  Doing errands, shopping? N  Some recent data might be hidden    Patient Care Team: Esaw Grandchild, NP as PCP - General (Family Medicine) Brendolyn Patty, MD (Dermatology) Warden Fillers, MD as Consulting Physician (Ophthalmology) Amalia Greenhouse, MD as Referring Physician (Endocrinology) Croitoru, Dani Gobble, MD as Consulting Physician (  Cardiology)   Assessment:   This is a routine wellness examination for [place].  Exercise Activities and Dietary recommendations House work/yard work Walking: 2-3 times/week  Fall Risk Fall Risk  04/09/2019 02/22/2018 02/21/2017  Falls in the past year? 0 No No   Is the patient's home free of loose throw rugs in walkways, pet beds, electrical cords, etc?   yes      Grab bars in the bathroom? yes      Handrails on the stairs?   yes      Adequate lighting?   yes  Timed Get Up and Go Performed: N/A, encounter not performed in clinic  Depression Screen PHQ 2/9 Scores 04/09/2019 03/19/2019 01/10/2019 08/28/2018  PHQ - 2 Score 0 0 0 0  PHQ- 9 Score 0 0 0 0    Cognitive Function-WNL     6CIT Screen 04/09/2019  What Year? 0 points  What month? 0 points  What time? 0 points  Count back from 20 0 points  Months in reverse 0 points  Repeat phrase 2 points  Total Score 2    Immunization History  Administered Date(s) Administered  . Pneumococcal Conjugate-13 02/22/2018    Qualifies for Shingles Vaccine? Yes, ordered   Screening Tests Health Maintenance  Topic Date Due  . TETANUS/TDAP  10/22/1970  . COLONOSCOPY  10/22/2001  . PNA vac Low Risk Adult (2 of 2 - PPSV23) 02/23/2019  . Hepatitis C Screening  08/14/2019 (Originally 1951-10-07)  . INFLUENZA VACCINE  07/14/2019   Cancer Screenings: Lung: Low Dose CT Chest recommended if Age  53-80 years, 30 pack-year currently smoking OR have quit w/in 15years. Patient does qualify. Last 03/02/2018, recommend to repeat annually  Colorectal: Pt declined  Additional Screenings:  Hepatitis C Screening: Ordered      Plan:  Continue all medications as directed Remain well hydrated, follow heart healthy diet Increase daily walking F/u OV 6 months COVID-19 Education: Signs and symptoms of COVID-19 infection were discussed with pt and how to seek care for testing.  The importance of following the Stay at Home order, and when out- Social Distancing and wearing a facial mask were discussed today. I have personally reviewed and noted the following in the patient's chart:   . Medical and social history . Use of alcohol, tobacco or illicit drugs  . Current medications and supplements . Functional ability and status . Nutritional status . Physical activity . Advanced directives . List of other physicians . Hospitalizations, surgeries, and ER visits in previous 12 months . Vitals . Screenings to include cognitive, depression, and falls . Referrals and appointments  In addition, I have reviewed and discussed with patient certain preventive protocols, quality metrics, and best practice recommendations. A written personalized care plan for preventive services as well as general preventive health recommendations were provided to patient.   I discussed the assessment and treatment plan with the patient. The patient was provided an opportunity to ask questions and all were answered. The patient agreed with the plan and demonstrated an understanding of the instructions.   The patient was advised to call back or seek an in-person evaluation if the symptoms worsen or if the condition fails to improve as anticipated.   Esaw Grandchild, NP  04/09/2019

## 2019-04-24 ENCOUNTER — Ambulatory Visit (INDEPENDENT_AMBULATORY_CARE_PROVIDER_SITE_OTHER): Payer: Medicare Other

## 2019-04-24 ENCOUNTER — Other Ambulatory Visit: Payer: Self-pay

## 2019-04-24 VITALS — BP 114/76 | HR 87

## 2019-04-24 DIAGNOSIS — Z23 Encounter for immunization: Secondary | ICD-10-CM

## 2019-04-24 NOTE — Progress Notes (Signed)
Pt here for Pneumovax vaccine.  Screening questionnaire reviewed, VIS provided to patient, and any/all patient questions answered.  Charyl Bigger, CMA

## 2019-06-25 ENCOUNTER — Ambulatory Visit (INDEPENDENT_AMBULATORY_CARE_PROVIDER_SITE_OTHER): Payer: Medicare Other | Admitting: *Deleted

## 2019-06-25 DIAGNOSIS — I4821 Permanent atrial fibrillation: Secondary | ICD-10-CM | POA: Diagnosis not present

## 2019-06-25 DIAGNOSIS — I420 Dilated cardiomyopathy: Secondary | ICD-10-CM

## 2019-06-25 LAB — CUP PACEART REMOTE DEVICE CHECK
Battery Remaining Longevity: 38 mo
Battery Voltage: 2.96 V
Brady Statistic AP VP Percent: 0 %
Brady Statistic AP VS Percent: 0 %
Brady Statistic AS VP Percent: 97.54 %
Brady Statistic AS VS Percent: 2.46 %
Brady Statistic RA Percent Paced: 0 %
Brady Statistic RV Percent Paced: 96.17 %
Date Time Interrogation Session: 20200713082403
HighPow Impedance: 42 Ohm
HighPow Impedance: 55 Ohm
Implantable Lead Implant Date: 20071217
Implantable Lead Implant Date: 20071217
Implantable Lead Implant Date: 20071217
Implantable Lead Location: 753858
Implantable Lead Location: 753859
Implantable Lead Location: 753860
Implantable Lead Model: 4194
Implantable Lead Model: 5076
Implantable Lead Model: 6947
Implantable Pulse Generator Implant Date: 20160524
Lead Channel Impedance Value: 285 Ohm
Lead Channel Impedance Value: 304 Ohm
Lead Channel Impedance Value: 342 Ohm
Lead Channel Impedance Value: 418 Ohm
Lead Channel Impedance Value: 532 Ohm
Lead Channel Impedance Value: 665 Ohm
Lead Channel Pacing Threshold Amplitude: 0.625 V
Lead Channel Pacing Threshold Amplitude: 0.75 V
Lead Channel Pacing Threshold Pulse Width: 0.4 ms
Lead Channel Pacing Threshold Pulse Width: 0.8 ms
Lead Channel Sensing Intrinsic Amplitude: 4.25 mV
Lead Channel Sensing Intrinsic Amplitude: 4.25 mV
Lead Channel Setting Pacing Amplitude: 1.75 V
Lead Channel Setting Pacing Amplitude: 2.5 V
Lead Channel Setting Pacing Pulse Width: 0.4 ms
Lead Channel Setting Pacing Pulse Width: 0.8 ms
Lead Channel Setting Sensing Sensitivity: 0.3 mV

## 2019-07-06 NOTE — Progress Notes (Signed)
Remote ICD transmission.   

## 2019-08-22 ENCOUNTER — Other Ambulatory Visit: Payer: Self-pay | Admitting: Cardiovascular Disease

## 2019-09-13 ENCOUNTER — Telehealth: Payer: Self-pay | Admitting: Cardiovascular Disease

## 2019-09-13 NOTE — Telephone Encounter (Signed)
New Message   Dental Hygienist Tina Griffiths is calling in to speak with a nurse or doctor in regards to patient and cleaning tomorrow.

## 2019-09-13 NOTE — Telephone Encounter (Signed)
Contacted [employer] represenitative Engineer, technical sales. She will research the dentistry equipment and contact [facility] with industry recommendations related to device.

## 2019-09-13 NOTE — Telephone Encounter (Signed)
I will route to device clinic, as I do not know how to answer this question.

## 2019-09-13 NOTE — Telephone Encounter (Signed)
Spoke with Dental Hygienist Tina Griffiths who states pt is scheduled for periodontal maintenance on 10/2 at 1100. She states that pt is pacemaker dependent and she wanted pt cardiologist to be aware that part of periodontal maintenance includes the use of a Cavitron ultrasonic dental hygiene instrument that runs at 30,000 K per second in terms of vibrations. She states in the past this instrument was not used on pts with pacemakers but she is not sure if this is still the case with newer pacemakers. She states Dr. Candyce Churn is pt dentist at [facility] and contact number for her is [phone]. Informed her that triage nurse would get back in contact with her with an update. She verbalize understanding.   Attempted to return call to [facility]. Left message on Dr. Alyson Ingles voicemail regarding request for cardiac clearance to be faxed to [phone]

## 2019-09-15 ENCOUNTER — Other Ambulatory Visit: Payer: Self-pay | Admitting: Adult Health

## 2019-09-17 ENCOUNTER — Ambulatory Visit: Payer: Medicare Other | Admitting: Adult Health

## 2019-09-19 ENCOUNTER — Ambulatory Visit: Payer: Medicare Other | Admitting: Adult Health

## 2019-09-21 NOTE — Progress Notes (Signed)
Subjective:    Patient ID: Brian Craig, male    DOB: 04-11-1951, 68 y.o.   MRN: [id number]  HPI:03/19/2019 OV: Brian Craig calls in today to discuss GAD He reports mood stable on Sertraline 50mg  QD and PRN Alprazolam 0.5mg - estimates to use 1-2 tabs per month He denies SI/HI He is now working remotely for [employer], states "it has been crazy busy" He estimates to drink >gallon water/day He has been trying to follow heart healthy diet. HE denies regular exercise, but reports yesterday he mowed two fields yesterday Home BP readings: SBP 110-120s DBP 70s He denies CP/dyspnea/HA/dizziness/palpitations/fever/cough He has been remaining at home as much as possible with his wife  09/24/2019 OV: Brian Craig is here for 6 month f/u: GAD, HTN, HLD He reports mood stable on Sertraline 50mg  QD and PRN Alprazolam 0.5mg - estimates to use 1-2 tabs per month He denies SI/HI Requests refill- PDMP verified- no aberrancies noted, last RF 03/19/2019 for 30 count. He does not check BP/HR at home. He denies acute cardiac sx's. He reports previous dizziness with position change, however that has since resolved since he reduced Furosemide 40mg . He now only takes full dose Sunday and Monday, then half tablet remaining days of week. He denies increase in lower ext edema. His wt is up 13 lbs since last OV 03/2019 Current wt 311 Body mass index is 42.25 kg/m.  He is agreeable to referral to Healthy Wt/Wellness clinic to help with improved eating and achieve life long weight loss.  He is followed by cardiology annually. AICD interrogated quarterly. He is followed by Endocrinology Q6M- denies increase in fatigue levels- currently in Levothyroxine 230mcg QD 02/20/2019 TSH- 1.550  Lab Results  Component Value Date   HGBA1C 5.5 02/20/2019   HGBA1C 5.4 02/22/2018   HGBA1C 5.5 02/21/2017   Patient Care Team    Relationship Specialty Notifications Start End  Mina Marble D, NP PCP - General Family Medicine   10/07/17   Brendolyn Patty, MD  Dermatology  02/21/17   Warden Fillers, MD Consulting Physician Ophthalmology  02/21/17   Amalia Greenhouse, MD Referring Physician Endocrinology  02/21/17   Sanda Klein, MD Consulting Physician Cardiology  02/21/17     Patient Active Problem List   Diagnosis Date Noted  . Encounter for Medicare annual wellness exam 04/09/2019  . Otitis media 01/10/2019  . Biventricular automatic implantable cardioverter defibrillator in situ 09/05/2018  . Postablative hypothyroidism 09/05/2018  . BMI 40.0-44.9, adult ([place]) 08/28/2018  . Acute bronchitis 04/25/2018  . Cough in adult 04/25/2018  . Hyperlipidemia 02/22/2018  . Testicular swelling, left 10/10/2017  . HTN (hypertension) 08/25/2017  . Anxiety 08/25/2017  . Healthcare maintenance 08/25/2017  . Localized swelling, mass or lump of neck 06/01/2017  . Vitamin D deficiency 03/04/2017  . Long term current use of anticoagulant 09/20/2016  . Chronic diastolic heart failure ([place]) 02/02/2016  . History of alcohol dependence ([place]) 02/02/2016  . ICD (implantable cardioverter-defibrillator) battery depletion 04/17/2015  . Congestive dilated cardiomyopathy ([place]) 07/15/2014  . Chronic combined systolic and diastolic CHF, NYHA class 1 ([place]) 07/15/2014  . OSA on CPAP 07/15/2014  . Permanent atrial fibrillation ([place]) 07/15/2014  . CHB (complete heart block) ([place]) 07/15/2014  . Biventricular ICD  implanted 2007, generator change in 2012 07/15/2014  . Tobacco abuse 07/15/2014  . Alcoholism in recovery ([place]) 07/15/2014  . Hypothyroidism, postradioiodine therapy 07/15/2014  . Dyslipidemia (high LDL; low HDL) 07/15/2014     Past Medical History:  Diagnosis Date  . Atrial fibrillation (  [place])   . CHF (congestive heart failure) ([place])   . Graves disease    Graves  . Hyperlipidemia   . Hypertension   . OSA treated with BiPAP   . Substance abuse ([place])    alcohol     Past Surgical History:  Procedure Laterality Date   . APPENDECTOMY    . BIV ICD GENERTAOR CHANGE OUT Left 2012   Medtronic Protecta  . CARDIAC ELECTROPHYSIOLOGY STUDY AND ABLATION    . EP IMPLANTABLE DEVICE N/A 05/06/2015   Procedure: ICD Generator Changeout;  Surgeon: Sanda Klein, MD;  Location: [facility];  Service: Cardiovascular;  Laterality: N/A;  . HERNIA REPAIR    . MENISCUS REPAIR Right   . VASECTOMY     unilateral due to injury     Family History  Problem Relation Age of Onset  . Heart disease Mother   . Hypertension Mother   . Heart disease Father   . Hyperlipidemia Father   . Hypertension Father   . Heart attack Father   . Heart disease Paternal Aunt   . Heart disease Paternal Uncle   . Alcohol abuse Maternal Uncle   . Alcohol abuse Maternal Grandfather      Social History   Substance and Sexual Activity  Drug Use No     Social History   Substance and Sexual Activity  Alcohol Use No   Comment: recovering alcoholic     Social History   Tobacco Use  Smoking Status Current Every Day Smoker  . Packs/day: 0.50  . Years: 51.00  . Pack years: 25.50  . Types: Cigarettes  Smokeless Tobacco Never Used     Outpatient Encounter Medications as of 09/24/2019  Medication Sig  . acetaminophen (TYLENOL) 325 MG tablet Take 650 mg by mouth every 6 (six) hours as needed for headache.  . ALPRAZolam (XANAX) 0.5 MG tablet Take 1 tablet (0.5 mg total) by mouth 3 (three) times daily as needed for anxiety.  . carvedilol (COREG CR) 80 MG 24 hr capsule TAKE 1 CAPSULE DAILY  . Cholecalciferol (CVS D3) 2000 units CAPS Take 2,000 Units by mouth daily.  . furosemide (LASIX) 40 MG tablet TAKE 1 TABLET DAILY  . levothyroxine (SYNTHROID, LEVOTHROID) 200 MCG tablet Take 200 mcg by mouth daily before breakfast.  . lisinopril (PRINIVIL,ZESTRIL) 10 MG tablet TAKE 1 TABLET TWICE A DAY  . Multiple Vitamin (MULTIVITAMIN) tablet Take 1 tablet by mouth daily.  . potassium chloride (K-DUR) 10 MEQ tablet TAKE 1 TABLET DAILY   . PRADAXA 150 MG CAPS capsule TAKE 1 CAPSULE TWICE DAILY  . sertraline (ZOLOFT) 50 MG tablet TAKE 1 TABLET DAILY  . simvastatin (ZOCOR) 40 MG tablet TAKE 1 TABLET DAILY  . [DISCONTINUED] ALPRAZolam (XANAX) 0.5 MG tablet Take 1 tablet (0.5 mg total) by mouth 3 (three) times daily as needed for anxiety.   No facility-administered encounter medications on file as of 09/24/2019.     Allergies: Patient has no known allergies.  Body mass index is 42.25 kg/m.  Blood pressure 101/67, pulse 88, temperature 98.3 F (36.8 C), temperature source Oral, height 6' (1.829 m), weight (!) 311 lb 8 oz (141.3 kg), SpO2 98 %.  Review of Systems  Constitutional: Positive for fatigue. Negative for activity change, appetite change, chills, diaphoresis, fever and unexpected weight change.  Eyes: Negative for visual disturbance.  Respiratory: Negative for cough, chest tightness, shortness of breath, wheezing and stridor.   Cardiovascular: Negative for chest pain, palpitations and leg swelling.  Endocrine: Negative for cold intolerance, heat intolerance, polydipsia, polyphagia and polyuria.  Genitourinary: Negative for difficulty urinating and flank pain.  Musculoskeletal: Negative for arthralgias, back pain, gait problem, joint swelling, myalgias, neck pain and neck stiffness.  Skin: Negative for color change, pallor, rash and wound.  Neurological: Negative for dizziness and headaches.  Hematological: Negative for adenopathy. Does not bruise/bleed easily.  Psychiatric/Behavioral: Negative for agitation, behavioral problems, confusion, decreased concentration, dysphoric mood, hallucinations, self-injury, sleep disturbance and suicidal ideas. The patient is not nervous/anxious and is not hyperactive.        Objective:   Physical Exam Constitutional:      General: He is not in acute distress.    Appearance: He is obese. He is not ill-appearing, toxic-appearing or diaphoretic.  HENT:     Head:  Normocephalic and atraumatic.  Cardiovascular:     Rate and Rhythm: Normal rate and regular rhythm.     Pulses: Normal pulses.     Heart sounds: Normal heart sounds. No murmur. No friction rub. No gallop.      Comments: AICD Left anterior chest  Lower ext- no edema noted Pulmonary:     Effort: Pulmonary effort is normal. No respiratory distress.     Breath sounds: Normal breath sounds. No stridor. No wheezing, rhonchi or rales.  Chest:     Chest wall: No tenderness.  Skin:    General: Skin is warm and dry.     Capillary Refill: Capillary refill takes less than 2 seconds.  Neurological:     Mental Status: He is alert and oriented to person, place, and time.  Psychiatric:        Mood and Affect: Mood normal.        Behavior: Behavior normal.        Thought Content: Thought content normal.        Judgment: Judgment normal.       Assessment & Plan:   1. BMI 40.0-44.9, adult ([place])   2. Healthcare maintenance   3. Hyperlipidemia, unspecified hyperlipidemia type   4. ICD (implantable cardioverter-defibrillator) battery depletion   5. Long term current use of anticoagulant   6. Hypothyroidism, postradioiodine therapy   7. Dyslipidemia (high LDL; low HDL)   8. Biventricular ICD  implanted 2007, generator change in 2012   9. Anxiety     Healthcare maintenance Continue all medications as directed. Remain well hydrated, follow Mediterranean diet. Continue follow-up with Cardiology and Endocrinology as directed. Referral placed to Healthy Weight and Wellness. Remain as active as tolerated. Continue to social distance and wear a mask when in public. Follow-up 6 months- physical with fasting labs the week prior.  Hyperlipidemia Remains ETOH free  Long term current use of anticoagulant Pradaxa 150mg  BID He denies unusual bleeding/bruising   Hypothyroidism, postradioiodine therapy He is followed by Endocrinology Q6M- denies increase in fatigue levels- currently in Levothyroxine  236mcg QD 02/20/2019 TSH- 1.550  Dyslipidemia (high LDL; low HDL) 02/20/2019 Lipid Panel Ref Range & Units 51mo ago   Cholesterol, Total 100 - 199 mg/dL 95Low    Triglycerides 0 - 149 mg/dL 104   HDL >39 mg/dL 31Low    VLDL Cholesterol Cal 5 - 40 mg/dL 21   LDL Calculated 0 - 99 mg/dL 43   Chol/HDL Ratio 0.0 - 5.0 ratio 3.1    Currently on Simvastatin 40mg  QD  Biventricular ICD  implanted 2007, generator change in 2012 He denies device ever firing/delivring shock He denies acute cardiac sx's  interrogated quarterly  Anxiety  He reports mood stable on Sertraline 50mg  QD and PRN Alprazolam 0.5mg - estimates to use 1-2 tabs per month He denies SI/HI Requests refill- PDMP verified- no aberrancies noted, last RF 03/19/2019 for 30 count.  BMI 40.0-44.9, adult ([place]) Agreeable to referral to Healthy Weight/Wellness     FOLLOW-UP:  Return in about 6 months (around 03/24/2020) for CPE, Fasting Labs.

## 2019-09-24 ENCOUNTER — Encounter: Payer: Self-pay | Admitting: Adult Health

## 2019-09-24 ENCOUNTER — Ambulatory Visit (INDEPENDENT_AMBULATORY_CARE_PROVIDER_SITE_OTHER): Payer: Medicare Other | Admitting: Adult Health

## 2019-09-24 ENCOUNTER — Other Ambulatory Visit: Payer: Self-pay

## 2019-09-24 VITALS — BP 101/67 | HR 88 | Temp 98.3°F | Ht 72.0 in | Wt 311.5 lb

## 2019-09-24 DIAGNOSIS — Z95810 Presence of automatic (implantable) cardiac defibrillator: Secondary | ICD-10-CM | POA: Diagnosis not present

## 2019-09-24 DIAGNOSIS — Z7901 Long term (current) use of anticoagulants: Secondary | ICD-10-CM

## 2019-09-24 DIAGNOSIS — Z4502 Encounter for adjustment and management of automatic implantable cardiac defibrillator: Secondary | ICD-10-CM

## 2019-09-24 DIAGNOSIS — E890 Postprocedural hypothyroidism: Secondary | ICD-10-CM | POA: Diagnosis not present

## 2019-09-24 DIAGNOSIS — E785 Hyperlipidemia, unspecified: Secondary | ICD-10-CM | POA: Diagnosis not present

## 2019-09-24 DIAGNOSIS — Z6841 Body Mass Index (BMI) 40.0 and over, adult: Secondary | ICD-10-CM | POA: Diagnosis not present

## 2019-09-24 DIAGNOSIS — F419 Anxiety disorder, unspecified: Secondary | ICD-10-CM | POA: Diagnosis not present

## 2019-09-24 DIAGNOSIS — Z0000 Encounter for general adult medical examination without abnormal findings: Secondary | ICD-10-CM

## 2019-09-24 MED ORDER — ALPRAZOLAM 0.5 MG PO TABS: 0.5000 mg | ORAL_TABLET | Freq: Three times a day (TID) | ORAL | 0 refills | Status: DC | PRN

## 2019-09-24 NOTE — Assessment & Plan Note (Signed)
He reports mood stable on Sertraline 50mg  QD and PRN Alprazolam 0.5mg - estimates to use 1-2 tabs per month He denies SI/HI Requests refill- PDMP verified- no aberrancies noted, last RF 03/19/2019 for 30 count.

## 2019-09-24 NOTE — Assessment & Plan Note (Signed)
Pradaxa 150mg  BID He denies unusual bleeding/bruising

## 2019-09-24 NOTE — Assessment & Plan Note (Signed)
02/20/2019 Lipid Panel Ref Range & Units 19mo ago   Cholesterol, Total 100 - 199 mg/dL 95Low    Triglycerides 0 - 149 mg/dL 104   HDL >39 mg/dL 31Low    VLDL Cholesterol Cal 5 - 40 mg/dL 21   LDL Calculated 0 - 99 mg/dL 43   Chol/HDL Ratio 0.0 - 5.0 ratio 3.1    Currently on Simvastatin 40mg  QD

## 2019-09-24 NOTE — Assessment & Plan Note (Deleted)
He denies device ever firing/delivring shock

## 2019-09-24 NOTE — Assessment & Plan Note (Addendum)
>>  ASSESSMENT AND PLAN FOR HYPERLIPIDEMIA WRITTEN ON 09/24/2019 10:29 AM BY Madalena Kesecker D, NP  Remains ETOH free  >>ASSESSMENT AND PLAN FOR DYSLIPIDEMIA (HIGH LDL; LOW HDL) WRITTEN ON 09/24/2019 10:33 AM BY Ajah Vanhoose D, NP  02/20/2019 Lipid Panel Ref Range & Units 39mo ago   Cholesterol, Total 100 - 199 mg/dL [id number]    Triglycerides 0 - 149 mg/dL 604   HDL >54 mg/dL [id number]    VLDL Cholesterol Cal 5 - 40 mg/dL 21   LDL Calculated 0 - 99 mg/dL 43   Chol/HDL Ratio 0.0 - 5.0 ratio 3.1    Currently on Simvastatin  QD

## 2019-09-24 NOTE — Assessment & Plan Note (Signed)
He denies device ever firing/delivring shock He denies acute cardiac sx's  interrogated quarterly

## 2019-09-24 NOTE — Assessment & Plan Note (Signed)
He is followed by Endocrinology Q6M- denies increase in fatigue levels- currently in Levothyroxine 297mcg QD 02/20/2019 TSH- 1.550

## 2019-09-24 NOTE — Assessment & Plan Note (Signed)
Agreeable to referral to Healthy Weight/Wellness

## 2019-09-24 NOTE — Assessment & Plan Note (Signed)
Continue all medications as directed. Remain well hydrated, follow Mediterranean diet. Continue follow-up with Cardiology and Endocrinology as directed. Referral placed to Healthy Weight and Wellness. Remain as active as tolerated. Continue to social distance and wear a mask when in public. Follow-up 6 months- physical with fasting labs the week prior.

## 2019-09-24 NOTE — Assessment & Plan Note (Signed)
>>  ASSESSMENT AND PLAN FOR HYPOTHYROIDISM, POSTRADIOIODINE THERAPY WRITTEN ON 09/24/2019 10:30 AM BY DANFORD, KATY D, NP  He is followed by Endocrinology Q6M- denies increase in fatigue levels- currently in Levothyroxine QD 02/20/2019 TSH- 1.550

## 2019-09-24 NOTE — Patient Instructions (Addendum)
Mediterranean Diet A Mediterranean diet refers to food and lifestyle choices that are based on the traditions of countries located on the [employer]. This way of eating has been shown to help prevent certain conditions and improve outcomes for people who have chronic diseases, like kidney disease and heart disease. What are tips for following this plan? Lifestyle  Cook and eat meals together with your family, when possible.  Drink enough fluid to keep your urine clear or pale yellow.  Be physically active every day. This includes: ? Aerobic exercise like running or swimming. ? Leisure activities like gardening, walking, or housework.  Get 7-8 hours of sleep each night.  If recommended by your health care provider, drink red wine in moderation. This means 1 glass a day for nonpregnant women and 2 glasses a day for men. A glass of wine equals 5 oz (150 mL). Reading food labels   Check the serving size of packaged foods. For foods such as rice and pasta, the serving size refers to the amount of cooked product, not dry.  Check the total fat in packaged foods. Avoid foods that have saturated fat or trans fats.  Check the ingredients list for added sugars, such as corn syrup. Shopping  At the grocery store, buy most of your food from the areas near the walls of the store. This includes: ? Fresh fruits and vegetables (produce). ? Grains, beans, nuts, and seeds. Some of these may be available in unpackaged forms or large amounts (in bulk). ? Fresh seafood. ? Poultry and eggs. ? Low-fat dairy products.  Buy whole ingredients instead of prepackaged foods.  Buy fresh fruits and vegetables in-season from local farmers markets.  Buy frozen fruits and vegetables in resealable bags.  If you do not have access to quality fresh seafood, buy precooked frozen shrimp or canned fish, such as tuna, salmon, or sardines.  Buy small amounts of raw or cooked vegetables, salads, or olives from  the deli or salad bar at your store.  Stock your pantry so you always have certain foods on hand, such as olive oil, canned tuna, canned tomatoes, rice, pasta, and beans. Cooking  Cook foods with extra-virgin olive oil instead of using butter or other vegetable oils.  Have meat as a side dish, and have vegetables or grains as your main dish. This means having meat in small portions or adding small amounts of meat to foods like pasta or stew.  Use beans or vegetables instead of meat in common dishes like chili or lasagna.  Experiment with different cooking methods. Try roasting or broiling vegetables instead of steaming or sauteing them.  Add frozen vegetables to soups, stews, pasta, or rice.  Add nuts or seeds for added healthy fat at each meal. You can add these to yogurt, salads, or vegetable dishes.  Marinate fish or vegetables using olive oil, lemon juice, garlic, and fresh herbs. Meal planning   Plan to eat 1 vegetarian meal one day each week. Try to work up to 2 vegetarian meals, if possible.  Eat seafood 2 or more times a week.  Have healthy snacks readily available, such as: ? Vegetable sticks with hummus. ? [place] yogurt. ? Fruit and nut trail mix.  Eat balanced meals throughout the week. This includes: ? Fruit: 2-3 servings a day ? Vegetables: 4-5 servings a day ? Low-fat dairy: 2 servings a day ? Fish, poultry, or lean meat: 1 serving a day ? Beans and legumes: 2 or more servings a week ?  Nuts and seeds: 1-2 servings a day ? Whole grains: 6-8 servings a day ? Extra-virgin olive oil: 3-4 servings a day  Limit red meat and sweets to only a few servings a month What are my food choices?  Mediterranean diet ? Recommended  Grains: Whole-grain pasta. Brown rice. Bulgar wheat. Polenta. Couscous. Whole-wheat bread. [place].  Vegetables: Artichokes. Beets. Broccoli. Cabbage. Carrots. Eggplant. Green beans. Chard. Kale. Spinach. Onions. Leeks. Peas. Squash.  Tomatoes. Peppers. Radishes.  Fruits: Apples. Apricots. Avocado. Berries. Bananas. Cherries. Dates. Figs. Grapes. Lemons. Melon. Oranges. Peaches. Plums. Pomegranate.  Meats and other protein foods: Beans. Almonds. Sunflower seeds. Pine nuts. Peanuts. [place]. Salmon. Scallops. Shrimp. [place]. Tilapia. Clams. Oysters. Eggs.  Dairy: Low-fat milk. Cheese. Greek yogurt.  Beverages: Water. Red wine. Herbal tea.  Fats and oils: Extra virgin olive oil. Avocado oil. Grape seed oil.  Sweets and desserts: [place] yogurt with honey. Baked apples. Poached pears. Trail mix.  Seasoning and other foods: Basil. Cilantro. Coriander. Cumin. Mint. Parsley. Sage. Rosemary. Tarragon. Garlic. Oregano. Thyme. Pepper. Balsalmic vinegar. Tahini. Hummus. Tomato sauce. Olives. Mushrooms. ? Limit these  Grains: Prepackaged pasta or rice dishes. Prepackaged cereal with added sugar.  Vegetables: Deep fried potatoes (french fries).  Fruits: Fruit canned in syrup.  Meats and other protein foods: Beef. Pork. Lamb. Poultry with skin. Hot dogs. [place] Salines.  Dairy: Ice cream. Sour cream. Whole milk.  Beverages: Juice. Sugar-sweetened soft drinks. Beer. Liquor and spirits.  Fats and oils: Butter. Canola oil. Vegetable oil. Beef fat (tallow). Lard.  Sweets and desserts: Cookies. Cakes. Pies. Candy.  Seasoning and other foods: Mayonnaise. Premade sauces and marinades. The items listed may not be a complete list. Talk with your dietitian about what dietary choices are right for you. Summary  The Mediterranean diet includes both food and lifestyle choices.  Eat a variety of fresh fruits and vegetables, beans, nuts, seeds, and whole grains.  Limit the amount of red meat and sweets that you eat.  Talk with your health care provider about whether it is safe for you to drink red wine in moderation. This means 1 glass a day for nonpregnant women and 2 glasses a day for men. A glass of wine equals 5 oz (150 mL). This information  is not intended to replace advice given to you by your health care provider. Make sure you discuss any questions you have with your health care provider. Document Released: 07/22/2016 Document Revised: 07/29/2016 Document Reviewed: 07/22/2016 Elsevier Patient Education  2020 [place] all medications as directed. Remain well hydrated, follow Mediterranean diet. Continue follow-up with Cardiology and Endocrinology as directed. Referral placed to [facility]. Remain as active as tolerated. Continue to social distance and wear a mask when in public. Follow-up 6 months- physical with fasting labs the week prior. GREAT TO SEE YOU!

## 2019-09-25 ENCOUNTER — Ambulatory Visit (INDEPENDENT_AMBULATORY_CARE_PROVIDER_SITE_OTHER): Payer: Medicare Other | Admitting: *Deleted

## 2019-09-25 DIAGNOSIS — I420 Dilated cardiomyopathy: Secondary | ICD-10-CM

## 2019-09-25 DIAGNOSIS — I442 Atrioventricular block, complete: Secondary | ICD-10-CM

## 2019-09-25 LAB — CUP PACEART REMOTE DEVICE CHECK
Battery Remaining Longevity: 34 mo
Battery Voltage: 2.96 V
Brady Statistic AP VP Percent: 0 %
Brady Statistic AP VS Percent: 0 %
Brady Statistic AS VP Percent: 97.73 %
Brady Statistic AS VS Percent: 2.27 %
Brady Statistic RA Percent Paced: 0 %
Brady Statistic RV Percent Paced: 96.5 %
Date Time Interrogation Session: 20201013062505
HighPow Impedance: 41 Ohm
HighPow Impedance: 57 Ohm
Implantable Lead Implant Date: 20071217
Implantable Lead Implant Date: 20071217
Implantable Lead Implant Date: 20071217
Implantable Lead Location: 753858
Implantable Lead Location: 753859
Implantable Lead Location: 753860
Implantable Lead Model: 4194
Implantable Lead Model: 5076
Implantable Lead Model: 6947
Implantable Pulse Generator Implant Date: 20160524
Lead Channel Impedance Value: 285 Ohm
Lead Channel Impedance Value: 304 Ohm
Lead Channel Impedance Value: 342 Ohm
Lead Channel Impedance Value: 399 Ohm
Lead Channel Impedance Value: 532 Ohm
Lead Channel Impedance Value: 703 Ohm
Lead Channel Pacing Threshold Amplitude: 0.625 V
Lead Channel Pacing Threshold Amplitude: 0.75 V
Lead Channel Pacing Threshold Pulse Width: 0.4 ms
Lead Channel Pacing Threshold Pulse Width: 0.8 ms
Lead Channel Sensing Intrinsic Amplitude: 4.375 mV
Lead Channel Sensing Intrinsic Amplitude: 4.375 mV
Lead Channel Setting Pacing Amplitude: 1.75 V
Lead Channel Setting Pacing Amplitude: 2.5 V
Lead Channel Setting Pacing Pulse Width: 0.4 ms
Lead Channel Setting Pacing Pulse Width: 0.8 ms
Lead Channel Setting Sensing Sensitivity: 0.3 mV

## 2019-09-27 ENCOUNTER — Telehealth: Payer: Self-pay | Admitting: Adult Health

## 2019-09-27 ENCOUNTER — Encounter: Payer: Self-pay | Admitting: Adult Health

## 2019-10-05 NOTE — Progress Notes (Signed)
Remote ICD transmission.   

## 2019-10-29 ENCOUNTER — Encounter: Payer: Self-pay | Admitting: Cardiovascular Disease

## 2019-10-29 ENCOUNTER — Other Ambulatory Visit: Payer: Self-pay

## 2019-10-29 ENCOUNTER — Ambulatory Visit (INDEPENDENT_AMBULATORY_CARE_PROVIDER_SITE_OTHER): Payer: Medicare Other | Admitting: Cardiovascular Disease

## 2019-10-29 VITALS — BP 88/60 | HR 78 | Temp 97.2°F | Ht 72.0 in | Wt 319.0 lb

## 2019-10-29 DIAGNOSIS — E785 Hyperlipidemia, unspecified: Secondary | ICD-10-CM

## 2019-10-29 DIAGNOSIS — Z95810 Presence of automatic (implantable) cardiac defibrillator: Secondary | ICD-10-CM

## 2019-10-29 DIAGNOSIS — Z720 Tobacco use: Secondary | ICD-10-CM | POA: Diagnosis not present

## 2019-10-29 DIAGNOSIS — I442 Atrioventricular block, complete: Secondary | ICD-10-CM | POA: Diagnosis not present

## 2019-10-29 DIAGNOSIS — I4821 Permanent atrial fibrillation: Secondary | ICD-10-CM | POA: Diagnosis not present

## 2019-10-29 DIAGNOSIS — Z7901 Long term (current) use of anticoagulants: Secondary | ICD-10-CM

## 2019-10-29 DIAGNOSIS — E890 Postprocedural hypothyroidism: Secondary | ICD-10-CM | POA: Diagnosis not present

## 2019-10-29 DIAGNOSIS — G4733 Obstructive sleep apnea (adult) (pediatric): Secondary | ICD-10-CM | POA: Diagnosis not present

## 2019-10-29 DIAGNOSIS — I5032 Chronic diastolic (congestive) heart failure: Secondary | ICD-10-CM

## 2019-10-29 NOTE — Patient Instructions (Signed)

## 2019-10-29 NOTE — Progress Notes (Signed)
Patient ID: Brian Craig, male   DOB: October 25, 1951, 68 y.o.   MRN: [id number]    Cardiology Office Note    Date:  10/31/2019   ID:  Brian Craig, DOB 12/29/1950, MRN [id number]  PCP:  Esaw Grandchild, NP  Cardiologist:   Sanda Klein, MD   Chief Complaint  Patient presents with  . Congestive Heart Failure  . Pacemaker Check    CRT-D    History of Present Illness:  Brian Craig is a 68 y.o. male who presents for permanent atrial fibrillation, nonischemic cardiomyopathy, complete heart block status post AV node ablation, biventricular defibrillator check.  He feels well and has not had any cardiovascular problems since his last appointment.  He does not complain of fatigue as he did a year ago and believes that he is doing better this year.  He remains morbidly obese and fairly sedentary.  He is taking a relatively low-dose of loop diuretics (40 mg of furosemide 2 days a week and only 20 mg the other days of the week) and has not had problems with edema or shortness of breath either at rest or with activity.  His blood pressure is low today but he denies any dizziness or syncope.  The patient specifically denies any chest pain at rest exertion, dyspnea at rest or with exertion, orthopnea, paroxysmal nocturnal dyspnea, syncope, palpitations, focal neurological deficits, intermittent claudication, lower extremity edema, unexplained weight gain, cough, hemoptysis or wheezing.  Device interrogation shows 100% biventricular pacing and permanent atrial fibrillation with complete heart block.  The device has not recorded sustained or nonsustained ventricular tachycardia and has not shown any recent episodes of  idioventricular rhythm (this happened in January 2017, October 2018 and not since).  Thoracic impedance has been stable.  Generator longevity is estimated to be 2.7 years Brian Craig has a history of severe dilated cardiomyopathy related to uncontrolled atrial fibrillation with rapid ventricular  response that started in the setting of thyrotoxicosis. His left ventricular ejection fraction was as low as 17%. Cardioversion failed. Eventually underwent AV node ablation and received a biventricular pacemaker-defibrillator in 2007. He underwent a generator change out in 2012 (atrial lead Medtronic 5076, ICD lead Medtronic [phone] Sprint Quattro secure, LV lead Medtronic 4194 Attain bipolar, Medtronic Viva XT gen change May 2016). His left ventricular ejection fraction has improved substantially since AV node ablation. His most recent echocardiogram performed in August 2015 shows normal left ventricular systolic function. He remains in permanent atrial fibrillation.   Past Medical History:  Diagnosis Date  . Atrial fibrillation ([place])   . CHF (congestive heart failure) ([place])   . Graves disease    Graves  . Hyperlipidemia   . Hypertension   . OSA treated with BiPAP   . Substance abuse ([place])    alcohol    Past Surgical History:  Procedure Laterality Date  . APPENDECTOMY    . BIV ICD GENERTAOR CHANGE OUT Left 2012   Medtronic Protecta  . CARDIAC ELECTROPHYSIOLOGY STUDY AND ABLATION    . EP IMPLANTABLE DEVICE N/A 05/06/2015   Procedure: ICD Generator Changeout;  Surgeon: Sanda Klein, MD;  Location: [facility];  Service: Cardiovascular;  Laterality: N/A;  . HERNIA REPAIR    . MENISCUS REPAIR Right   . VASECTOMY     unilateral due to injury    Outpatient Medications Prior to Visit  Medication Sig Dispense Refill  . acetaminophen (TYLENOL) 325 MG tablet Take 650 mg by mouth every 6 (six) hours as needed for  headache.    . ALPRAZolam (XANAX) 0.5 MG tablet Take 1 tablet (0.5 mg total) by mouth 3 (three) times daily as needed for anxiety. 30 tablet 0  . carvedilol (COREG CR) 80 MG 24 hr capsule TAKE 1 CAPSULE DAILY 90 capsule 0  . Cholecalciferol ([employer] D3) 2000 units CAPS Take 2,000 Units by mouth daily.    . furosemide (LASIX) 40 MG tablet Take on 40mg  by mouth Mon, Sun, 20mg   rest of the days.    Marland Kitchen levothyroxine (SYNTHROID, LEVOTHROID) 200 MCG tablet Take 200 mcg by mouth daily before breakfast.    . lisinopril (PRINIVIL,ZESTRIL) 10 MG tablet TAKE 1 TABLET TWICE A DAY 180 tablet 4  . Multiple Vitamin (MULTIVITAMIN) tablet Take 1 tablet by mouth daily.    . potassium chloride (KLOR-CON) 10 MEQ tablet Take 10 meq on sun, mon, wed, and Friday by mouth.    Marland Kitchen PRADAXA 150 MG CAPS capsule TAKE 1 CAPSULE TWICE DAILY 180 capsule 3  . sertraline (ZOLOFT) 50 MG tablet TAKE 1 TABLET DAILY 90 tablet 1  . simvastatin (ZOCOR) 40 MG tablet TAKE 1 TABLET DAILY 90 tablet 0  . furosemide (LASIX) 40 MG tablet TAKE 1 TABLET DAILY (Patient not taking: Reported on 10/29/2019) 90 tablet 0  . potassium chloride (K-DUR) 10 MEQ tablet TAKE 1 TABLET DAILY (Patient not taking: Reported on 10/29/2019) 90 tablet 0   No facility-administered medications prior to visit.      Allergies:   Patient has no known allergies.   Social History   Socioeconomic History  . Marital status: Married    Spouse name: Not on file  . Number of children: Not on file  . Years of education: Not on file  . Highest education level: Not on file  Occupational History  . Not on file  Social Needs  . Financial resource strain: Not on file  . Food insecurity    Worry: Not on file    Inability: Not on file  . Transportation needs    Medical: Not on file    Non-medical: Not on file  Tobacco Use  . Smoking status: Current Every Day Smoker    Packs/day: 0.50    Years: 51.00    Pack years: 25.50    Types: Cigarettes  . Smokeless tobacco: Never Used  Substance and Sexual Activity  . Alcohol use: No    Comment: recovering alcoholic  . Drug use: No  . Sexual activity: Yes    Birth control/protection: None  Lifestyle  . Physical activity    Days per week: Not on file    Minutes per session: Not on file  . Stress: Not on file  Relationships  . Social Herbalist on phone: Not on file    Gets  together: Not on file    Attends religious service: Not on file    Active member of club or organization: Not on file    Attends meetings of clubs or organizations: Not on file    Relationship status: Not on file  Other Topics Concern  . Not on file  Social History Narrative  . Not on file     Family History:  The patient's family history includes Alcohol abuse in his maternal grandfather and maternal uncle; Heart attack in his father; Heart disease in his father, mother, paternal aunt, and paternal uncle; Hyperlipidemia in his father; Hypertension in his father and mother.   ROS:   Please see the history of present illness.  ROS All other systems reviewed and are negative.   PHYSICAL EXAM:   VS:  BP (!) 88/60   Pulse 78   Temp (!) 97.2 F (36.2 C)   Ht 6' (1.829 m)   Wt (!) 319 lb (144.7 kg)   SpO2 97%   BMI 43.26 kg/m      General: Alert, oriented x3, no distress, morbidly obese (which limits the physical exam).  The ICD site appears healthy Head: no evidence of trauma, PERRL, EOMI, no exophtalmos or lid lag, no myxedema, no xanthelasma; normal ears, nose and oropharynx Neck: normal jugular venous pulsations and no hepatojugular reflux; brisk carotid pulses without delay and no carotid bruits Chest: clear to auscultation, no signs of consolidation by percussion or palpation, normal fremitus, symmetrical and full respiratory excursions Cardiovascular: normal position and quality of the apical impulse, regular rhythm, normal first and second heart sounds, no murmurs, rubs or gallops Abdomen: no tenderness or distention, no masses by palpation, no abnormal pulsatility or arterial bruits, normal bowel sounds, no hepatosplenomegaly Extremities: no clubbing, cyanosis or edema; 2+ radial, ulnar and brachial pulses bilaterally; 2+ right femoral, posterior tibial and dorsalis pedis pulses; 2+ left femoral, posterior tibial and dorsalis pedis pulses; no subclavian or femoral bruits  Neurological: grossly nonfocal Psych: Normal mood and affect   Wt Readings from Last 3 Encounters:  10/29/19 (!) 319 lb (144.7 kg)  09/24/19 (!) 311 lb 8 oz (141.3 kg)  04/09/19 297 lb 9.6 oz (135 kg)      Studies/Labs Reviewed:   EKG:  EKG is ordered today.  It shows background atrial fibrillation and biventricular paced rhythm with a distinct positive R wave in lead V1, QTC 506 ms  Recent Labs: 02/20/2019: ALT 11; BUN 12; Creatinine, Ser 1.06; Hemoglobin 14.1; Platelets 211; Potassium 4.1; Sodium 146; TSH 1.550   Lipid Panel    Component Value Date/Time   CHOL 95 (L) 02/20/2019 0823   TRIG 104 02/20/2019 0823   HDL 31 (L) 02/20/2019 0823   CHOLHDL 3.1 02/20/2019 0823   CHOLHDL 3.6 02/09/2017 1103   VLDL 25 02/09/2017 1103   LDLCALC 43 02/20/2019 0823      ASSESSMENT:    1. Heart block AV complete ([place])   2. Permanent atrial fibrillation ([place])   3. Long term current use of anticoagulant   4. Biventricular automatic implantable cardioverter defibrillator in situ   5. Chronic diastolic heart failure ([place])   6. OSA (obstructive sleep apnea)   7. Morbid obesity ([place])   8. Tobacco abuse   9. Dyslipidemia (high LDL; low HDL)   10. Postablative hypothyroidism       PLAN:  In order of problems listed above:  1. CHB: Pacemaker dependent following AV node ablation. 2. AFib: Permanent arrhythmia. No history of stroke or other embolic events. CHADSvasc 2 (age, CHF).   3. Anticoagulation: No bleeding complications, most recent hemoglobin 14.1. 4. CRT-D: Comprehensive check in the office today.  Normal findings.  Continue remote downloads every 3 months. 5. CHF: Despite morbid obesity he has good functional status and is euvolemic both clinically and by thoracic impedance.  Continue current regimen of diuretics, which is working well for him.Marland Kitchen  He is on appropriate treatment with carvedilol and ACE inhibitor, based on previous history of depressed LV systolic function, but  these can be decreased if his hypotension becomes a problem. 6. OSA on CPAP: He reports compliance with CPAP and has no problems with daytime hypersomnolence. 7. Obesity: He has actually gained  weight in the last year, probably not fluid but real weight. 8. Tobacco abuse: Smokes less than a pack of cigarettes a month.  Not interested in quitting. 9. Dyslipidemia (high LDL; low HDL): Most recent lipid profile shows an excellent LDL, with chronically low HDL, unlikely to improve without substantial weight loss. 10. Hypothyroidism, postradioiodine therapy: Normal TSH earlier this year. 11. Alcoholism in recovery: Has been abstinent for many years.   Medication Adjustments/Labs and Tests Ordered: Current medicines are reviewed at length with the patient today.  Concerns regarding medicines are outlined above.  Medication changes, Labs and Tests ordered today are listed in the Patient Instructions below. Patient Instructions  Medication Instructions:  No changes *If you need a refill on your cardiac medications before your next appointment, please call your pharmacy*  Lab Work: None ordered If you have labs (blood work) drawn today and your tests are completely normal, you will receive your results only by: Marland Kitchen MyChart Message (if you have MyChart) OR . A paper copy in the mail If you have any lab test that is abnormal or we need to change your treatment, we will call you to review the results.  Testing/Procedures: None ordered  Follow-Up: At [facility], you and your health needs are our priority.  As part of our continuing mission to provide you with exceptional heart care, we have created designated Provider Care Teams.  These Care Teams include your primary Cardiologist (physician) and Advanced Practice Providers (APPs -  Physician Assistants and Nurse Practitioners) who all work together to provide you with the care you need, when you need it.  Your next appointment:   12 months  The  format for your next appointment:   In Person  Provider:   Sanda Klein, MD         Signed, Sanda Klein, MD  10/31/2019 4:27 PM    [facility] [address], [place], [place]  [address] Phone: [phone]; Fax: [phone]

## 2019-10-31 ENCOUNTER — Encounter: Payer: Self-pay | Admitting: Cardiovascular Disease

## 2019-11-01 ENCOUNTER — Other Ambulatory Visit: Payer: Self-pay | Admitting: Cardiovascular Disease

## 2019-11-20 ENCOUNTER — Other Ambulatory Visit: Payer: Self-pay | Admitting: Cardiovascular Disease

## 2019-12-25 ENCOUNTER — Ambulatory Visit (INDEPENDENT_AMBULATORY_CARE_PROVIDER_SITE_OTHER): Payer: Medicare Other | Admitting: *Deleted

## 2019-12-25 DIAGNOSIS — I420 Dilated cardiomyopathy: Secondary | ICD-10-CM | POA: Diagnosis not present

## 2019-12-25 LAB — CUP PACEART REMOTE DEVICE CHECK
Battery Remaining Longevity: 29 mo
Battery Voltage: 2.95 V
Brady Statistic AP VP Percent: 0 %
Brady Statistic AP VS Percent: 0 %
Brady Statistic AS VP Percent: 98.34 %
Brady Statistic AS VS Percent: 1.66 %
Brady Statistic RA Percent Paced: 0 %
Brady Statistic RV Percent Paced: 96.93 %
Date Time Interrogation Session: 20210112042305
HighPow Impedance: 45 Ohm
HighPow Impedance: 57 Ohm
Implantable Lead Implant Date: 20071217
Implantable Lead Implant Date: 20071217
Implantable Lead Implant Date: 20071217
Implantable Lead Location: 753858
Implantable Lead Location: 753859
Implantable Lead Location: 753860
Implantable Lead Model: 4194
Implantable Lead Model: 5076
Implantable Lead Model: 6947
Implantable Pulse Generator Implant Date: 20160524
Lead Channel Impedance Value: 285 Ohm
Lead Channel Impedance Value: 304 Ohm
Lead Channel Impedance Value: 361 Ohm
Lead Channel Impedance Value: 399 Ohm
Lead Channel Impedance Value: 532 Ohm
Lead Channel Impedance Value: 722 Ohm
Lead Channel Pacing Threshold Amplitude: 0.75 V
Lead Channel Pacing Threshold Amplitude: 1 V
Lead Channel Pacing Threshold Pulse Width: 0.4 ms
Lead Channel Pacing Threshold Pulse Width: 0.8 ms
Lead Channel Sensing Intrinsic Amplitude: 4.25 mV
Lead Channel Sensing Intrinsic Amplitude: 4.25 mV
Lead Channel Setting Pacing Amplitude: 2 V
Lead Channel Setting Pacing Amplitude: 2.5 V
Lead Channel Setting Pacing Pulse Width: 0.4 ms
Lead Channel Setting Pacing Pulse Width: 0.8 ms
Lead Channel Setting Sensing Sensitivity: 0.3 mV

## 2020-01-08 DIAGNOSIS — E039 Hypothyroidism, unspecified: Secondary | ICD-10-CM | POA: Diagnosis not present

## 2020-01-10 ENCOUNTER — Other Ambulatory Visit: Payer: Self-pay | Admitting: Cardiovascular Disease

## 2020-01-17 ENCOUNTER — Encounter: Payer: Self-pay | Admitting: Adult Health

## 2020-01-27 ENCOUNTER — Ambulatory Visit: Payer: Medicare Other | Attending: Internal Medicine

## 2020-01-27 DIAGNOSIS — Z23 Encounter for immunization: Secondary | ICD-10-CM | POA: Insufficient documentation

## 2020-01-27 NOTE — Progress Notes (Signed)
   Covid-19 Vaccination Clinic  Name:  Brian Craig    MRN: [id number] DOB: [birth year]  01/27/2020  [name] was observed post Covid-19 immunization for 15 minutes without incidence. He was provided with Vaccine Information Sheet and instruction to access the V-Safe system.   [name] was instructed to call 911 with any severe reactions post vaccine: Marland Kitchen Difficulty breathing  . Swelling of your face and throat  . A fast heartbeat  . A bad rash all over your body  . Dizziness and weakness    Immunizations Administered    Name Date Dose VIS Date Route   Pfizer COVID-19 Vaccine 01/27/2020 10:24 AM 0.3 mL 11/23/2019 Intramuscular   Manufacturer: [place]   Lot: [id number]   [place]: [id number]

## 2020-02-19 ENCOUNTER — Ambulatory Visit: Payer: Medicare Other | Attending: Internal Medicine

## 2020-02-19 DIAGNOSIS — Z23 Encounter for immunization: Secondary | ICD-10-CM | POA: Insufficient documentation

## 2020-02-19 NOTE — Progress Notes (Signed)
   Covid-19 Vaccination Clinic  Name:  Brian Craig    MRN: [id number] DOB: 05-Dec-1951  02/19/2020  [name] was observed post Covid-19 immunization for 15 minutes without incident. He was provided with Vaccine Information Sheet and instruction to access the V-Safe system.   [name] was instructed to call 911 with any severe reactions post vaccine: Marland Kitchen Difficulty breathing  . Swelling of face and throat  . A fast heartbeat  . A bad rash all over body  . Dizziness and weakness   Immunizations Administered    Name Date Dose VIS Date Route   Pfizer COVID-19 Vaccine 02/19/2020 12:02 PM 0.3 mL 11/23/2019 Intramuscular   Manufacturer: [place]   Lot: [id number]   [place]: [id number]

## 2020-03-17 ENCOUNTER — Other Ambulatory Visit: Payer: Medicare Other

## 2020-03-17 ENCOUNTER — Other Ambulatory Visit: Payer: Self-pay

## 2020-03-17 DIAGNOSIS — E890 Postprocedural hypothyroidism: Secondary | ICD-10-CM

## 2020-03-17 DIAGNOSIS — R739 Hyperglycemia, unspecified: Secondary | ICD-10-CM | POA: Diagnosis not present

## 2020-03-17 DIAGNOSIS — E559 Vitamin D deficiency, unspecified: Secondary | ICD-10-CM | POA: Diagnosis not present

## 2020-03-17 DIAGNOSIS — Z0000 Encounter for general adult medical examination without abnormal findings: Secondary | ICD-10-CM

## 2020-03-17 DIAGNOSIS — Z114 Encounter for screening for human immunodeficiency virus [HIV]: Secondary | ICD-10-CM | POA: Diagnosis not present

## 2020-03-17 DIAGNOSIS — E785 Hyperlipidemia, unspecified: Secondary | ICD-10-CM

## 2020-03-17 DIAGNOSIS — Z1159 Encounter for screening for other viral diseases: Secondary | ICD-10-CM

## 2020-03-17 DIAGNOSIS — I10 Essential (primary) hypertension: Secondary | ICD-10-CM

## 2020-03-18 LAB — CBC WITH DIFFERENTIAL/PLATELET
Basophils Absolute: 0.1 10*3/uL (ref 0.0–0.2)
Basos: 1 %
EOS (ABSOLUTE): 0.2 10*3/uL (ref 0.0–0.4)
Eos: 2 %
Hematocrit: 46.6 % (ref 37.5–51.0)
Hemoglobin: 15.3 g/dL (ref 13.0–17.7)
Immature Grans (Abs): 0 10*3/uL (ref 0.0–0.1)
Immature Granulocytes: 0 %
Lymphocytes Absolute: 3.4 10*3/uL — ABNORMAL HIGH (ref 0.7–3.1)
Lymphs: 35 %
MCH: 32.1 pg (ref 26.6–33.0)
MCHC: 32.8 g/dL (ref 31.5–35.7)
MCV: 98 fL — ABNORMAL HIGH (ref 79–97)
Monocytes Absolute: 0.9 10*3/uL (ref 0.1–0.9)
Monocytes: 9 %
Neutrophils Absolute: 5.3 10*3/uL (ref 1.4–7.0)
Neutrophils: 53 %
Platelets: 238 10*3/uL (ref 150–450)
RBC: 4.76 x10E6/uL (ref 4.14–5.80)
RDW: 11.8 % (ref 11.6–15.4)
WBC: 9.9 10*3/uL (ref 3.4–10.8)

## 2020-03-18 LAB — COMPREHENSIVE METABOLIC PANEL
ALT: 14 IU/L (ref 0–44)
AST: 19 IU/L (ref 0–40)
Albumin/Globulin Ratio: 1.8 (ref 1.2–2.2)
Albumin: 4.4 g/dL (ref 3.8–4.8)
Alkaline Phosphatase: 98 IU/L (ref 39–117)
BUN/Creatinine Ratio: 10 (ref 10–24)
BUN: 11 mg/dL (ref 8–27)
Bilirubin Total: 0.4 mg/dL (ref 0.0–1.2)
CO2: 25 mmol/L (ref 20–29)
Calcium: 9.7 mg/dL (ref 8.6–10.2)
Chloride: 103 mmol/L (ref 96–106)
Creatinine, Ser: 1.08 mg/dL (ref 0.76–1.27)
GFR calc Af Amer: 81 mL/min/{1.73_m2} (ref >59)
GFR calc non Af Amer: 70 mL/min/{1.73_m2} (ref >59)
Globulin, Total: 2.5 g/dL (ref 1.5–4.5)
Glucose: 106 mg/dL — ABNORMAL HIGH (ref 65–99)
Potassium: 4.3 mmol/L (ref 3.5–5.2)
Sodium: 143 mmol/L (ref 134–144)
Total Protein: 6.9 g/dL (ref 6.0–8.5)

## 2020-03-18 LAB — VITAMIN D 25 HYDROXY (VIT D DEFICIENCY, FRACTURES): Vit D, 25-Hydroxy: 51.7 ng/mL (ref 30.0–100.0)

## 2020-03-18 LAB — HEMOGLOBIN A1C
Est. average glucose Bld gHb Est-mCnc: 117 mg/dL
Hgb A1c MFr Bld: 5.7 % — ABNORMAL HIGH (ref 4.8–5.6)

## 2020-03-18 LAB — T4, FREE: Free T4: 1.35 ng/dL (ref 0.82–1.77)

## 2020-03-18 LAB — LIPID PANEL
Chol/HDL Ratio: 3.2 ratio (ref 0.0–5.0)
Cholesterol, Total: 110 mg/dL (ref 100–199)
HDL: 34 mg/dL — ABNORMAL LOW (ref >39)
LDL Chol Calc (NIH): 53 mg/dL (ref 0–99)
Triglycerides: 130 mg/dL (ref 0–149)
VLDL Cholesterol Cal: 23 mg/dL (ref 5–40)

## 2020-03-18 LAB — TSH: TSH: 2.02 u[IU]/mL (ref 0.450–4.500)

## 2020-03-18 LAB — HEPATITIS C ANTIBODY: Hep C Virus Ab: <0.1 s/co ratio (ref 0.0–0.9)

## 2020-03-18 LAB — HIV ANTIBODY (ROUTINE TESTING W REFLEX): HIV Screen 4th Generation wRfx: NONREACTIVE

## 2020-03-24 ENCOUNTER — Encounter: Payer: Medicare Other | Admitting: Physician Assistant

## 2020-03-25 ENCOUNTER — Ambulatory Visit (INDEPENDENT_AMBULATORY_CARE_PROVIDER_SITE_OTHER): Payer: Medicare Other | Admitting: *Deleted

## 2020-03-25 DIAGNOSIS — I420 Dilated cardiomyopathy: Secondary | ICD-10-CM

## 2020-03-25 LAB — CUP PACEART REMOTE DEVICE CHECK
Battery Remaining Longevity: 26 mo
Battery Voltage: 2.94 V
Brady Statistic AP VP Percent: 0 %
Brady Statistic AP VS Percent: 0 %
Brady Statistic AS VP Percent: 99.09 %
Brady Statistic AS VS Percent: 0.91 %
Brady Statistic RA Percent Paced: 0 %
Brady Statistic RV Percent Paced: 98.3 %
Date Time Interrogation Session: 20210413022703
HighPow Impedance: 45 Ohm
HighPow Impedance: 62 Ohm
Implantable Lead Implant Date: 20071217
Implantable Lead Implant Date: 20071217
Implantable Lead Implant Date: 20071217
Implantable Lead Location: 753858
Implantable Lead Location: 753859
Implantable Lead Location: 753860
Implantable Lead Model: 4194
Implantable Lead Model: 5076
Implantable Lead Model: 6947
Implantable Pulse Generator Implant Date: 20160524
Lead Channel Impedance Value: 247 Ohm
Lead Channel Impedance Value: 304 Ohm
Lead Channel Impedance Value: 361 Ohm
Lead Channel Impedance Value: 399 Ohm
Lead Channel Impedance Value: 589 Ohm
Lead Channel Impedance Value: 760 Ohm
Lead Channel Pacing Threshold Amplitude: 0.75 V
Lead Channel Pacing Threshold Amplitude: 0.875 V
Lead Channel Pacing Threshold Pulse Width: 0.4 ms
Lead Channel Pacing Threshold Pulse Width: 0.8 ms
Lead Channel Sensing Intrinsic Amplitude: 4.375 mV
Lead Channel Sensing Intrinsic Amplitude: 4.375 mV
Lead Channel Setting Pacing Amplitude: 1.75 V
Lead Channel Setting Pacing Amplitude: 2.5 V
Lead Channel Setting Pacing Pulse Width: 0.4 ms
Lead Channel Setting Pacing Pulse Width: 0.8 ms
Lead Channel Setting Sensing Sensitivity: 0.3 mV

## 2020-03-26 NOTE — Progress Notes (Signed)
ICD Remote  

## 2020-04-14 ENCOUNTER — Encounter: Payer: Self-pay | Admitting: Physician Assistant

## 2020-04-14 ENCOUNTER — Other Ambulatory Visit: Payer: Self-pay

## 2020-04-14 ENCOUNTER — Telehealth (INDEPENDENT_AMBULATORY_CARE_PROVIDER_SITE_OTHER): Payer: Medicare Other | Admitting: Physician Assistant

## 2020-04-14 VITALS — BP 115/67 | HR 80 | Temp 97.4°F | Ht 72.0 in | Wt 311.0 lb

## 2020-04-14 DIAGNOSIS — Z0000 Encounter for general adult medical examination without abnormal findings: Secondary | ICD-10-CM

## 2020-04-14 DIAGNOSIS — Z122 Encounter for screening for malignant neoplasm of respiratory organs: Secondary | ICD-10-CM | POA: Diagnosis not present

## 2020-04-14 DIAGNOSIS — Z23 Encounter for immunization: Secondary | ICD-10-CM | POA: Diagnosis not present

## 2020-04-14 MED ORDER — TETANUS-DIPHTH-ACELL PERTUSSIS 5-2.5-18.5 LF-MCG/0.5 IM SUSP: 0.5000 mL | Freq: Once | INTRAMUSCULAR | 0 refills | Status: AC

## 2020-04-14 MED ORDER — SHINGRIX 50 MCG/0.5ML IM SUSR: 0.5000 mL | Freq: Once | INTRAMUSCULAR | 0 refills | Status: AC

## 2020-04-14 NOTE — Progress Notes (Signed)
Virtual Visit via Telephone Note:  I connected with Brian Craig on 04/14/2020 by telephone and verified that I am speaking with the correct person using two identifiers.   I discussed the limitations, risks, security and privacy concerns for performing an evaluation and management service by telephone and the availability of in person appointments. The staff discussed with patient that there may be a patient responsible charge related to this service. The patient expressed understanding and agreed to proceed.  Location of Patient- Home Location of Provider- Office  Subjective:   Brian Craig is a 69 y.o. male who presents for Medicare Annual/Subsequent preventive examination.   Review of Systems: General:   No F/C, wt loss Pulm:   No DIB, SOB, pleuritic chest pain Card:  No CP, palpitations Abd:  No n/v/d or pain Ext:  No inc edema from baseline    Objective:    Vitals: BP 115/67   Pulse 80   Temp (!) 97.4 F (36.3 C) (Oral)   Ht 6' (1.829 m)   Wt (!) 311 lb (141.1 kg)   BMI 42.18 kg/m   Body mass index is 42.18 kg/m.  Advanced Directives 07/19/2017 05/12/2017 02/21/2017 05/06/2015  Does Patient Have a Medical Advance Directive? No No No No  Would patient like information on creating a medical advance directive? Yes (Inpatient - patient requests chaplain consult to create a medical advance directive);No - Patient declined Yes (MAU/Ambulatory/Procedural Areas - Information given) Yes (MAU/Ambulatory/Procedural Areas - Information given) No - patient declined information    Tobacco Social History   Tobacco Use  Smoking Status Current Every Day Smoker  . Packs/day: 0.50  . Years: 51.00  . Pack years: 25.50  . Types: Cigarettes  Smokeless Tobacco Never Used     Ready to quit: Not Answered Counseling given: Not Answered     Past Medical History:  Diagnosis Date  . Atrial fibrillation ([place])   . CHF (congestive heart failure) ([place])   . Graves disease    Graves  .  Hyperlipidemia   . Hypertension   . OSA treated with BiPAP   . Substance abuse ([place])    alcohol   Past Surgical History:  Procedure Laterality Date  . APPENDECTOMY    . BIV ICD GENERTAOR CHANGE OUT Left 2012   Medtronic Protecta  . CARDIAC ELECTROPHYSIOLOGY STUDY AND ABLATION    . EP IMPLANTABLE DEVICE N/A 05/06/2015   Procedure: ICD Generator Changeout;  Surgeon: Sanda Klein, MD;  Location: [facility];  Service: Cardiovascular;  Laterality: N/A;  . HERNIA REPAIR    . MENISCUS REPAIR Right   . VASECTOMY     unilateral due to injury   Family History  Problem Relation Age of Onset  . Heart disease Mother   . Hypertension Mother   . Heart disease Father   . Hyperlipidemia Father   . Hypertension Father   . Heart attack Father   . Heart disease Paternal Aunt   . Heart disease Paternal Uncle   . Alcohol abuse Maternal Uncle   . Alcohol abuse Maternal Grandfather    Social History   Socioeconomic History  . Marital status: Married    Spouse name: Not on file  . Number of children: Not on file  . Years of education: Not on file  . Highest education level: Not on file  Occupational History  . Not on file  Tobacco Use  . Smoking status: Current Every Day Smoker    Packs/day: 0.50  Years: 51.00    Pack years: 25.50    Types: Cigarettes  . Smokeless tobacco: Never Used  Substance and Sexual Activity  . Alcohol use: No    Comment: recovering alcoholic  . Drug use: No  . Sexual activity: Yes    Birth control/protection: None  Other Topics Concern  . Not on file  Social History Narrative  . Not on file   Social Determinants of Health   Financial Resource Strain:   . Difficulty of Paying Living Expenses:   Food Insecurity:   . Worried About Charity fundraiser in the Last Year:   . Arboriculturist in the Last Year:   Transportation Needs:   . Film/video editor (Medical):   Marland Kitchen Lack of Transportation (Non-Medical):   Physical Activity:   . Days of  Exercise per Week:   . Minutes of Exercise per Session:   Stress:   . Feeling of Stress :   Social Connections:   . Frequency of Communication with Friends and Family:   . Frequency of Social Gatherings with Friends and Family:   . Attends Religious Services:   . Active Member of Clubs or Organizations:   . Attends Archivist Meetings:   Marland Kitchen Marital Status:     Outpatient Encounter Medications as of 04/14/2020  Medication Sig  . acetaminophen (TYLENOL) 325 MG tablet Take 650 mg by mouth every 6 (six) hours as needed for headache.  . ALPRAZolam (XANAX) 0.5 MG tablet Take 1 tablet (0.5 mg total) by mouth 3 (three) times daily as needed for anxiety.  . carvedilol (COREG CR) 80 MG 24 hr capsule TAKE 1 CAPSULE DAILY  . Cholecalciferol ([employer] D3) 2000 units CAPS Take 2,000 Units by mouth daily.  . furosemide (LASIX) 40 MG tablet Take 40 mg by mouth. Take 2 tablets on Sunday and Monday and one tablet the remainder of the week  . levothyroxine (SYNTHROID, LEVOTHROID) 200 MCG tablet Take 200 mcg by mouth daily before breakfast.  . lisinopril (ZESTRIL) 10 MG tablet TAKE 1 TABLET TWICE A DAY  . Multiple Vitamin (MULTIVITAMIN) tablet Take 1 tablet by mouth daily.  . potassium chloride (KLOR-CON) 10 MEQ tablet Take 10 mEq by mouth daily.  Marland Kitchen PRADAXA 150 MG CAPS capsule TAKE 1 CAPSULE TWICE DAILY  . sertraline (ZOLOFT) 50 MG tablet TAKE 1 TABLET DAILY  . simvastatin (ZOCOR) 40 MG tablet TAKE 1 TABLET DAILY  . [EXPIRED] Tdap (BOOSTRIX) 5-2.5-18.5 LF-MCG/0.5 injection Inject 0.5 mLs into the muscle once for 1 dose.  . [EXPIRED] Zoster Vaccine Adjuvanted [facility]) injection Inject 0.5 mLs into the muscle once for 1 dose.  . [DISCONTINUED] furosemide (LASIX) 40 MG tablet Take 40mg  on Sun and Mon and 20mg  on the other days  . [DISCONTINUED] potassium chloride (KLOR-CON) 10 MEQ tablet Take 10 meq on sun, mon, wed, and Friday by mouth.  . [DISCONTINUED] potassium chloride (KLOR-CON) 10 MEQ tablet Take  1 tablet daily EXCEPT for Tues, Thurs, Sat   No facility-administered encounter medications on file as of 04/14/2020.    Activities of Daily Living In your present state of health, do you have any difficulty performing the following activities: 04/14/2020  Hearing? N  Vision? N  Difficulty concentrating or making decisions? N  Walking or climbing stairs? N  Dressing or bathing? N  Doing errands, shopping? N  Some recent data might be hidden    Patient Care Team: Brendolyn Patty, MD (Dermatology) Warden Fillers, MD as Consulting Physician (Ophthalmology)  Amalia Greenhouse, MD as Referring Physician (Endocrinology) Sanda Klein, MD as Consulting Physician (Cardiology)   Assessment:   This is a routine wellness examination for D[place].  Exercise Activities and Dietary recommendations House work/currently taking care of his wife who recently fractured tibia. Diet consists of vegetables, chicken and seafood  Goals   None     Fall Risk Fall Risk  04/14/2020 04/09/2019 02/22/2018 02/21/2017  Falls in the past year? 0 0 No No  Follow up Falls evaluation completed - - -   Is the patient's home free of loose throw rugs in walkways, pet beds, electrical cords, etc?   yes      Grab bars in the bathroom? yes      Handrails on the stairs?   yes      Adequate lighting?   yes  Timed Get Up and Go Performed: N/A, encounter not performed in clinic  Depression Screen PHQ 2/9 Scores 04/14/2020 09/24/2019 04/09/2019 03/19/2019  PHQ - 2 Score 0 0 0 0  PHQ- 9 Score 0 0 0 0    Cognitive Function: WNL     6CIT Screen 04/14/2020 04/09/2019  What Year? 0 points 0 points  What month? 0 points 0 points  What time? 0 points 0 points  Count back from 20 0 points 0 points  Months in reverse 0 points 0 points  Repeat phrase 2 points 2 points  Total Score 2 2    Immunization History  Administered Date(s) Administered  . PFIZER SARS-COV-2 Vaccination 01/27/2020, 02/19/2020  . Pneumococcal Conjugate-13  02/22/2018  . Pneumococcal Polysaccharide-23 04/24/2019    Qualifies for Shingles Vaccine? Yes, order placed.  Screening Tests Health Maintenance  Topic Date Due  . TETANUS/TDAP  Never done  . COLONOSCOPY  04/14/2021 (Originally 10/22/2001)  . INFLUENZA VACCINE  07/13/2020  . COVID-19 Vaccine  Completed  . Hepatitis C Screening  Completed  . PNA vac Low Risk Adult  Completed   Cancer Screenings: Lung: Low Dose CT Chest recommended if Age 39-80 years, 30 pack-year currently smoking OR have quit w/in 15years. Patient does qualify. Order placed. Colorectal: Pt declined.  Additional Screenings:  Hepatitis C Screening: done 03/17/20, normal      Plan:  - Continue heart health diet and stay as active as possible. - Continue all medications as instructed. - Follow-up in 6 months   I have personally reviewed and noted the following in the patient's chart:   . Medical and social history . Use of alcohol, tobacco or illicit drugs  . Current medications and supplements . Functional ability and status . Nutritional status . Physical activity . Advanced directives . List of other physicians . Hospitalizations, surgeries, and ER visits in previous 12 months . Vitals . Screenings to include cognitive, depression, and falls . Referrals and appointments  In addition, I have reviewed and discussed with patient certain preventive protocols, quality metrics, and best practice recommendations. A written personalized care plan for preventive services as well as general preventive health recommendations were provided to patient.   I discussed the assessment and treatment plan with the patient. The patient was provided an opportunity to ask questions and all were answered. The patient agreed with the plan and demonstrated an understanding of the instructions.  The patient was advised to call back or seek an in-person evaluation if the symptoms worsen or if the condition fails to improve as  anticipated.    Lorrene Reid, PA-C  04/15/2020

## 2020-04-29 ENCOUNTER — Other Ambulatory Visit: Payer: Self-pay

## 2020-04-29 ENCOUNTER — Ambulatory Visit
Admission: RE | Admit: 2020-04-29 | Discharge: 2020-04-29 | Disposition: A | Discharge status: Home / Self Care | Payer: Medicare Other | Source: Ambulatory Visit | Attending: Physician Assistant | Admitting: Physician Assistant

## 2020-04-29 DIAGNOSIS — Z122 Encounter for screening for malignant neoplasm of respiratory organs: Secondary | ICD-10-CM

## 2020-04-29 DIAGNOSIS — F17210 Nicotine dependence, cigarettes, uncomplicated: Secondary | ICD-10-CM | POA: Diagnosis not present

## 2020-05-05 ENCOUNTER — Other Ambulatory Visit: Payer: Self-pay | Admitting: Adult Health

## 2020-05-05 ENCOUNTER — Other Ambulatory Visit: Payer: Self-pay | Admitting: Cardiovascular Disease

## 2020-05-14 ENCOUNTER — Other Ambulatory Visit: Payer: Self-pay | Admitting: Physician Assistant

## 2020-05-14 MED ORDER — ALPRAZOLAM 0.5 MG PO TABS: 0.5000 mg | ORAL_TABLET | Freq: Three times a day (TID) | ORAL | 0 refills | Status: DC | PRN

## 2020-05-14 NOTE — Telephone Encounter (Signed)
Pt called req'd refill of :   ALPRAZolam (XANAX) 0.5 MG tablet [id number]   Order Details Dose: 0.5 mg Route: Oral Frequency: 3 times daily PRN for anxiety  Dispense Quantity: 30 tablet Refills: 0   Indications of Use: Anxiety      Sig: Take 1 tablet (0.5 mg total) by mouth 3 (three) times daily as needed for anxiety.   Forwarding request to med asst that if approved send refill order to  :   [facility] [place] [phone] (Phone) [phone] (Fax)   --glh

## 2020-05-14 NOTE — Telephone Encounter (Signed)
Patient last seen for telehealth 04/14/20 and advised to follow up in 6 months.   Last refill of Alprazolam given 09/24/19 #30 by Valetta Fuller.    Please approve if refill deemed appropriate. AS, CMA

## 2020-06-24 ENCOUNTER — Ambulatory Visit (INDEPENDENT_AMBULATORY_CARE_PROVIDER_SITE_OTHER): Payer: Medicare Other | Admitting: *Deleted

## 2020-06-24 DIAGNOSIS — I4821 Permanent atrial fibrillation: Secondary | ICD-10-CM | POA: Diagnosis not present

## 2020-06-24 LAB — CUP PACEART REMOTE DEVICE CHECK
Battery Remaining Longevity: 24 mo
Battery Voltage: 2.94 V
Brady Statistic AP VP Percent: 0 %
Brady Statistic AP VS Percent: 0 %
Brady Statistic AS VP Percent: 99.5 %
Brady Statistic AS VS Percent: 0.5 %
Brady Statistic RA Percent Paced: 0 %
Brady Statistic RV Percent Paced: 99.11 %
Date Time Interrogation Session: 20210713033523
HighPow Impedance: 43 Ohm
HighPow Impedance: 59 Ohm
Implantable Lead Implant Date: 20071217
Implantable Lead Implant Date: 20071217
Implantable Lead Implant Date: 20071217
Implantable Lead Location: 753858
Implantable Lead Location: 753859
Implantable Lead Location: 753860
Implantable Lead Model: 4194
Implantable Lead Model: 5076
Implantable Lead Model: 6947
Implantable Pulse Generator Implant Date: 20160524
Lead Channel Impedance Value: 247 Ohm
Lead Channel Impedance Value: 304 Ohm
Lead Channel Impedance Value: 342 Ohm
Lead Channel Impedance Value: 399 Ohm
Lead Channel Impedance Value: 551 Ohm
Lead Channel Impedance Value: 703 Ohm
Lead Channel Pacing Threshold Amplitude: 0.75 V
Lead Channel Pacing Threshold Amplitude: 0.875 V
Lead Channel Pacing Threshold Pulse Width: 0.4 ms
Lead Channel Pacing Threshold Pulse Width: 0.8 ms
Lead Channel Sensing Intrinsic Amplitude: 4.375 mV
Lead Channel Sensing Intrinsic Amplitude: 4.375 mV
Lead Channel Setting Pacing Amplitude: 2 V
Lead Channel Setting Pacing Amplitude: 2.5 V
Lead Channel Setting Pacing Pulse Width: 0.4 ms
Lead Channel Setting Pacing Pulse Width: 0.8 ms
Lead Channel Setting Sensing Sensitivity: 0.3 mV

## 2020-06-25 ENCOUNTER — Telehealth: Payer: Self-pay | Admitting: Physician Assistant

## 2020-06-25 MED ORDER — SERTRALINE HCL 50 MG PO TABS: 50.0000 mg | ORAL_TABLET | Freq: Every day | ORAL | 1 refills | Status: DC

## 2020-06-25 NOTE — Telephone Encounter (Signed)
Patient is requesting a refill of his Zoloft, if approved please send to [facility]

## 2020-06-25 NOTE — Addendum Note (Signed)
Addended by: Fonnie Mu on: 06/25/2020 02:45 PM   Modules accepted: Orders

## 2020-06-26 NOTE — Progress Notes (Signed)
Remote ICD transmission.   

## 2020-06-30 DIAGNOSIS — E0500 Thyrotoxicosis with diffuse goiter without thyrotoxic crisis or storm: Secondary | ICD-10-CM | POA: Diagnosis not present

## 2020-06-30 DIAGNOSIS — H0520 Unspecified exophthalmos: Secondary | ICD-10-CM | POA: Diagnosis not present

## 2020-06-30 DIAGNOSIS — H2513 Age-related nuclear cataract, bilateral: Secondary | ICD-10-CM | POA: Diagnosis not present

## 2020-06-30 DIAGNOSIS — H04123 Dry eye syndrome of bilateral lacrimal glands: Secondary | ICD-10-CM | POA: Diagnosis not present

## 2020-06-30 DIAGNOSIS — G4730 Sleep apnea, unspecified: Secondary | ICD-10-CM | POA: Diagnosis not present

## 2020-08-05 ENCOUNTER — Telehealth: Payer: Self-pay | Admitting: Physician Assistant

## 2020-08-05 NOTE — Telephone Encounter (Signed)
Left message for patient to call back. AS, CMA 

## 2020-08-05 NOTE — Telephone Encounter (Signed)
Patient called during lunch and left VM about pain and swelling in his left leg, sending message to [place] for triage

## 2020-08-06 NOTE — Telephone Encounter (Signed)
Attempted to contact patient again with no answer. Left message for patient to call back and saving message to chart. AS, CMA

## 2020-08-06 NOTE — Telephone Encounter (Signed)
Left message for patient to call back. AS, CMA 

## 2020-08-19 ENCOUNTER — Encounter: Payer: Self-pay | Admitting: Orthopaedic Surgery

## 2020-08-19 ENCOUNTER — Ambulatory Visit (INDEPENDENT_AMBULATORY_CARE_PROVIDER_SITE_OTHER): Payer: Medicare Other

## 2020-08-19 ENCOUNTER — Ambulatory Visit (INDEPENDENT_AMBULATORY_CARE_PROVIDER_SITE_OTHER): Payer: Medicare Other | Admitting: Orthopaedic Surgery

## 2020-08-19 VITALS — Ht 73.0 in | Wt 315.0 lb

## 2020-08-19 DIAGNOSIS — M545 Low back pain, unspecified: Secondary | ICD-10-CM

## 2020-08-19 DIAGNOSIS — M4306 Spondylolysis, lumbar region: Secondary | ICD-10-CM | POA: Diagnosis not present

## 2020-08-19 DIAGNOSIS — M4316 Spondylolisthesis, lumbar region: Secondary | ICD-10-CM | POA: Diagnosis not present

## 2020-08-19 MED ORDER — PREDNISONE 10 MG PO TABS
10.0000 mg | ORAL_TABLET | Freq: Every day | ORAL | 0 refills | Status: DC
Start: 2020-08-19 — End: 2020-12-23

## 2020-08-19 NOTE — Progress Notes (Signed)
Office Visit Note   Patient: Brian Craig           Date of Birth: 04/22/1951           MRN: [id number] Visit Date: 08/19/2020              Requested by: Lorrene Reid, PA-C [address],  [place] [address] PCP: Lorrene Reid, PA-C   Assessment & Plan: Visit Diagnoses:  1. Low back pain, unspecified back pain laterality, unspecified chronicity, unspecified whether sciatica present   2. Spondylolysis, lumbar region   3. Spondylolisthesis, lumbar region     Plan: Prednisone 10 mg 1 p.o. daily x10days called in.  Will avoid pain medication with his past history of addiction with alcohol.  Last A1c was 5.7.  Physical therapy referral recheck 6 weeks.  Follow-Up Instructions: Return in about 6 weeks (around 09/30/2020).   Orders:  Orders Placed This Encounter  Procedures  . XR Lumbar Spine 2-3 Views   Meds ordered this encounter  Medications  . predniSONE (DELTASONE) 10 MG tablet    Sig: Take 1 tablet (10 mg total) by mouth daily with breakfast.    Dispense:  10 tablet    Refill:  0      Procedures: No procedures performed   Clinical Data: No additional findings.   Subjective: Chief Complaint  Patient presents with  . Left Leg - Pain  . Lower Back - Pain    HPI 69 year old male whose wife is a patient of mine with tibial plateau surgery is here with back and buttocks pain that radiates down the dorsum of his left foot has been present since last month.  Does not recall any specific injury.  He states in the past he has pulled some muscles in his back off and on.  Does have a Investment banker, corporate.  Past history of alcohol abuse and dependency.  Patient been taking some Tylenol arthritis for symptoms.  Review of Systems follow-up for heart failure not currently symptomatic.  Pacemaker defibrillator.  Obesity.   Objective: Vital Signs: Ht 6\' 1"  (1.854 m)   Wt (!) 315 lb (142.9 kg)   BMI 41.56 kg/m   Physical Exam Constitutional:       Appearance: He is well-developed.  HENT:     Head: Normocephalic and atraumatic.  Eyes:     Pupils: Pupils are equal, round, and reactive to light.  Neck:     Thyroid: No thyromegaly.     Trachea: No tracheal deviation.  Cardiovascular:     Rate and Rhythm: Normal rate.     Comments: Pacemaker chest wall. Pulmonary:     Effort: Pulmonary effort is normal.     Breath sounds: No wheezing.  Abdominal:     General: Bowel sounds are normal.     Palpations: Abdomen is soft.  Skin:    General: Skin is warm and dry.     Capillary Refill: Capillary refill takes less than 2 seconds.  Neurological:     Mental Status: He is alert and oriented to person, place, and time.  Psychiatric:        Behavior: Behavior normal.        Thought Content: Thought content normal.        Judgment: Judgment normal.     Ortho Exam negative straight leg raising 90 degrees.  Anterior tib EHL is strong.  Some decreased sensation left dorsal foot normal on the right foot.  No calf atrophy.  Specialty Comments:  No specialty comments available.  Imaging: XR Lumbar Spine 2-3 Views  Result Date: 08/19/2020 AP lateral lumbar spine x-rays are obtained and reviewed.  This shows grade 2 L5-S1 spondylolisthesis with disc space narrowing and bilateral pars defects.  There is some calcification of the abdominal aorta.  Negative for acute changes. Impression: L5-S1 spondylolisthesis with bilateral L5 spondylolysis.    PMFS History: Patient Active Problem List   Diagnosis Date Noted  . Spondylolysis, lumbar region 08/19/2020  . Spondylolisthesis, lumbar region 08/19/2020  . Encounter for Medicare annual wellness exam 04/09/2019  . Otitis media 01/10/2019  . Biventricular automatic implantable cardioverter defibrillator in situ 09/05/2018  . Postablative hypothyroidism 09/05/2018  . BMI 40.0-44.9, adult ([place]) 08/28/2018  . Acute bronchitis 04/25/2018  . Cough in adult 04/25/2018  . Hyperlipidemia 02/22/2018    . Testicular swelling, left 10/10/2017  . HTN (hypertension) 08/25/2017  . Anxiety 08/25/2017  . Healthcare maintenance 08/25/2017  . Localized swelling, mass or lump of neck 06/01/2017  . Vitamin D deficiency 03/04/2017  . Long term current use of anticoagulant 09/20/2016  . Chronic diastolic heart failure ([place]) 02/02/2016  . History of alcohol dependence ([place]) 02/02/2016  . ICD (implantable cardioverter-defibrillator) battery depletion 04/17/2015  . Congestive dilated cardiomyopathy ([place]) 07/15/2014  . Chronic combined systolic and diastolic CHF, NYHA class 1 ([place]) 07/15/2014  . OSA on CPAP 07/15/2014  . Permanent atrial fibrillation ([place]) 07/15/2014  . CHB (complete heart block) ([place]) 07/15/2014  . Biventricular ICD  implanted 2007, generator change in 2012 07/15/2014  . Tobacco abuse 07/15/2014  . Alcoholism in recovery ([place]) 07/15/2014  . Hypothyroidism, postradioiodine therapy 07/15/2014  . Dyslipidemia (high LDL; low HDL) 07/15/2014   Past Medical History:  Diagnosis Date  . Atrial fibrillation ([place])   . CHF (congestive heart failure) ([place])   . Graves disease    Graves  . Hyperlipidemia   . Hypertension   . OSA treated with BiPAP   . Substance abuse ([place])    alcohol    Family History  Problem Relation Age of Onset  . Heart disease Mother   . Hypertension Mother   . Heart disease Father   . Hyperlipidemia Father   . Hypertension Father   . Heart attack Father   . Heart disease Paternal Aunt   . Heart disease Paternal Uncle   . Alcohol abuse Maternal Uncle   . Alcohol abuse Maternal Grandfather     Past Surgical History:  Procedure Laterality Date  . APPENDECTOMY    . BIV ICD GENERTAOR CHANGE OUT Left 2012   Medtronic Protecta  . CARDIAC ELECTROPHYSIOLOGY STUDY AND ABLATION    . EP IMPLANTABLE DEVICE N/A 05/06/2015   Procedure: ICD Generator Changeout;  Surgeon: Sanda Klein, MD;  Location: [facility];  Service: Cardiovascular;  Laterality: N/A;   . HERNIA REPAIR    . MENISCUS REPAIR Right   . VASECTOMY     unilateral due to injury   Social History   Occupational History  . Not on file  Tobacco Use  . Smoking status: Current Every Day Smoker    Packs/day: 0.50    Years: 51.00    Pack years: 25.50    Types: Cigarettes  . Smokeless tobacco: Never Used  Substance and Sexual Activity  . Alcohol use: No    Comment: recovering alcoholic  . Drug use: No  . Sexual activity: Yes    Birth control/protection: None

## 2020-08-19 NOTE — Addendum Note (Signed)
Addended by: Minda Ditto, Geoffery Spruce on: 08/19/2020 09:18 AM   Modules accepted: Orders

## 2020-09-05 ENCOUNTER — Ambulatory Visit: Payer: Medicare Other | Admitting: Physical Therapy

## 2020-09-23 ENCOUNTER — Ambulatory Visit (INDEPENDENT_AMBULATORY_CARE_PROVIDER_SITE_OTHER): Payer: Medicare Other

## 2020-09-23 DIAGNOSIS — I4821 Permanent atrial fibrillation: Secondary | ICD-10-CM

## 2020-09-23 LAB — CUP PACEART REMOTE DEVICE CHECK
Battery Remaining Longevity: 20 mo
Battery Voltage: 2.92 V
Brady Statistic AP VP Percent: 0 %
Brady Statistic AP VS Percent: 0 %
Brady Statistic AS VP Percent: 99.5 %
Brady Statistic AS VS Percent: 0.5 %
Brady Statistic RA Percent Paced: 0 %
Brady Statistic RV Percent Paced: 99.13 %
Date Time Interrogation Session: 20211012042308
HighPow Impedance: 43 Ohm
HighPow Impedance: 57 Ohm
Implantable Lead Implant Date: 20071217
Implantable Lead Implant Date: 20071217
Implantable Lead Implant Date: 20071217
Implantable Lead Location: 753858
Implantable Lead Location: 753859
Implantable Lead Location: 753860
Implantable Lead Model: 4194
Implantable Lead Model: 5076
Implantable Lead Model: 6947
Implantable Pulse Generator Implant Date: 20160524
Lead Channel Impedance Value: 247 Ohm
Lead Channel Impedance Value: 285 Ohm
Lead Channel Impedance Value: 342 Ohm
Lead Channel Impedance Value: 399 Ohm
Lead Channel Impedance Value: 513 Ohm
Lead Channel Impedance Value: 646 Ohm
Lead Channel Pacing Threshold Amplitude: 0.625 V
Lead Channel Pacing Threshold Amplitude: 0.875 V
Lead Channel Pacing Threshold Pulse Width: 0.4 ms
Lead Channel Pacing Threshold Pulse Width: 0.8 ms
Lead Channel Sensing Intrinsic Amplitude: 4.375 mV
Lead Channel Sensing Intrinsic Amplitude: 4.375 mV
Lead Channel Setting Pacing Amplitude: 2 V
Lead Channel Setting Pacing Amplitude: 2.5 V
Lead Channel Setting Pacing Pulse Width: 0.4 ms
Lead Channel Setting Pacing Pulse Width: 0.8 ms
Lead Channel Setting Sensing Sensitivity: 0.3 mV

## 2020-09-24 ENCOUNTER — Ambulatory Visit (INDEPENDENT_AMBULATORY_CARE_PROVIDER_SITE_OTHER): Payer: Medicare Other | Admitting: Physical Therapy

## 2020-09-24 ENCOUNTER — Encounter: Payer: Self-pay | Admitting: Physical Therapy

## 2020-09-24 ENCOUNTER — Other Ambulatory Visit: Payer: Self-pay

## 2020-09-24 DIAGNOSIS — G8929 Other chronic pain: Secondary | ICD-10-CM | POA: Diagnosis not present

## 2020-09-24 DIAGNOSIS — M6281 Muscle weakness (generalized): Secondary | ICD-10-CM

## 2020-09-24 DIAGNOSIS — M5442 Lumbago with sciatica, left side: Secondary | ICD-10-CM

## 2020-09-24 DIAGNOSIS — R2689 Other abnormalities of gait and mobility: Secondary | ICD-10-CM

## 2020-09-24 NOTE — Therapy (Signed)
[facility] Physical Therapy [address] [place], [place], [address] Phone: [phone]   Fax:  [phone]  Physical Therapy Evaluation  Patient Details  Name: Brian Craig MRN: [id number] Date of Birth: 1951-08-18 Referring Provider (PT): Dr. Rodell Perna   Encounter Date: 09/24/2020   PT End of Session - 09/24/20 1047    Visit Number 1    Number of Visits 16    Date for PT Re-Evaluation 11/19/20    Authorization Type MCR/Tricare    PT Start Time 0930    PT Stop Time 1015    PT Time Calculation (min) 45 min    Activity Tolerance Patient tolerated treatment well    Behavior During Therapy [facility] for tasks assessed/performed           Past Medical History:  Diagnosis Date   Atrial fibrillation ([place])    CHF (congestive heart failure) ([place])    Graves disease    Graves   Hyperlipidemia    Hypertension    OSA treated with BiPAP    Substance abuse ([place])    alcohol    Past Surgical History:  Procedure Laterality Date   APPENDECTOMY     BIV ICD GENERTAOR CHANGE OUT Left 2012   Medtronic Protecta   CARDIAC ELECTROPHYSIOLOGY STUDY AND ABLATION     EP IMPLANTABLE DEVICE N/A 05/06/2015   Procedure: ICD Generator Changeout;  Surgeon: Sanda Klein, MD;  Location: [place] [facility];  Service: Cardiovascular;  Laterality: N/A;   HERNIA REPAIR     MENISCUS REPAIR Right    VASECTOMY     unilateral due to injury    There were no vitals filed for this visit.    Subjective Assessment - 09/24/20 0936    Subjective he relays back pain and pain that shoots down his left leg. He has had this pain over last 3 months.    Pertinent History pacemaker,afib with ICD, CHF,graves dx, HTN, OSA, hx alcohol abuse    Limitations Sitting;Lifting;Standing;Walking    How long can you sit comfortably? depends    How long can you stand comfortably? 10 min    How long can you walk comfortably? 10 min    Diagnostic tests AP lateral lumbar spine x-rays are  obtained and reviewed.  This shows grade 2 L5-S1 spondylolisthesis with disc space narrowing and bilateral pars defects.  There is some calcification of the abdominal aorta.  Negative for acute    Patient Stated Goals reduce pain    Currently in Pain? Yes    Pain Score 5     Pain Location Back    Pain Orientation Lower    Pain Descriptors / Indicators Burning    Pain Type Other (Comment)   subacute   Pain Radiating Towards down his Lt leg to his foot, feels like he has a hot coal on his foot    Pain Onset More than a month ago    Pain Frequency Intermittent    Aggravating Factors  sitting, standing, walking    Pain Relieving Factors Tylenol, recliner, lidocane patch, sometimes lying              [facility] PT Assessment - 09/24/20 0001      Assessment   Medical Diagnosis Low back pain, with Lt radiculopathy, spondylolisthesis    Referring Provider (PT) Dr. Rodell Perna    Onset Date/Surgical Date --   3 month onset of pain   Next MD Visit PRN    Prior Therapy none  Precautions   Precautions None      Restrictions   Weight Bearing Restrictions No      Balance Screen   Has the patient fallen in the past 6 months No      [place] residence      Prior Function   Level of Independence Independent    Vocation Full time employment    Vocation Requirements spectrum, work from home    Leisure watch TV      Cognition   Overall Cognitive Status Within Functional Limits for tasks assessed      Observation/Other Assessments   Focus on Therapeutic Outcomes (FOTO)  40% functional score      Posture/Postural Control   Posture Comments Rt trunk shift?      ROM / Strength   AROM / PROM / Strength AROM;Strength      AROM   AROM Assessment Site Lumbar    Lumbar Flexion WNL    Lumbar Extension 25%    Lumbar - Right Side Bend 50%    Lumbar - Left Side Bend 50%    Lumbar - Right Rotation 75%    Lumbar - Left Rotation 50%   Pain on Lt side  with rotation      Strength   Overall Strength Comments leg strength WFL bilat      Flexibility   Soft Tissue Assessment /Muscle Length --   tight hamstings and hips     Palpation   Palpation comment denies TTP      Special Tests   Other special tests + slump test bilat, negative quadrant test bilat, +SLR on Rt that was negative on Lt                      Objective measurements completed on examination: See above findings.       [place] Adult PT Treatment/Exercise - 09/24/20 0001      Modalities   Modalities Moist Heat      Moist Heat Therapy   Number Minutes Moist Heat 10 Minutes    Moist Heat Location Lumbar Spine                  PT Education - 09/24/20 1047    Education Details HEP, POC, exam findings, need to get up and move/walk/stretch every hour    Person(s) Educated Patient;Spouse    Methods Explanation;Demonstration;Verbal cues;Handout    Comprehension Verbalized understanding;Need further instruction            PT Short Term Goals - 09/24/20 1056      PT SHORT TERM GOAL #1   Title Pt will be I and compliant with HEP.    Time 4    Period Weeks    Status New    Target Date 10/22/20             PT Long Term Goals - 09/24/20 1056      PT LONG TERM GOAL #1   Title Pt will improve lumbar ROM to [facility].    Time 8    Period Weeks    Status New    Target Date 11/19/20      PT LONG TERM GOAL #2   Title Pt will report less radicular symptoms into left leg and overall no more than 3/10 pain overall    Time 8    Period Weeks    Status New      PT LONG TERM GOAL #  3   Title Pt will improve FOTO score to 60% functional outcome.                  Plan - 09/24/20 1048    Clinical Impression Statement Pt presents with subacute chronic LBP with Lt sided radiculopath for past 3 months. He has L5-S1 spondylolisthesis with disc space narrowing and bilateral pars defects seen on XR. He will benefit from skilled PT to address his  functional deficits in sitting/standing tolerance, lumbar/hip ROM, decreased core strenght, and to address pain.    Personal Factors and Comorbidities Comorbidity 3+    Comorbidities pacemaker,afib with ICD, CHF,graves dx, HTN, OSA, hx alcohol abuse    Examination-Activity Limitations Bed Mobility;Bend;Carry;Lift;Stand;Sleep;Squat;Locomotion Level    Examination-Participation Restrictions Cleaning;Community Activity;Driving;Laundry;Yard Work;Shop;Occupation    Stability/Clinical Decision Making Evolving/Moderate complexity    Clinical Decision Making Moderate    Rehab Potential Good    PT Frequency 2x / week   1-2   PT Duration 8 weeks    PT Treatment/Interventions ADLs/Self Care Home Management;Cryotherapy;Moist Heat;Traction;Therapeutic activities;Therapeutic exercise;Neuromuscular re-education;Gait training;Manual techniques;Passive range of motion;Dry needling;Joint Manipulations;Spinal Manipulations;Taping    PT Next Visit Plan review and update HEP PRN, no electric modalaties due to pacemaker, possible lateral shift?    PT Home Exercise Plan Access Code: [email or web address]    Consulted and Agree with Plan of Care Patient           Patient will benefit from skilled therapeutic intervention in order to improve the following deficits and impairments:  Decreased activity tolerance, Decreased endurance, Decreased range of motion, Decreased strength, Difficulty walking, Hypomobility, Impaired flexibility, Increased muscle spasms, Increased fascial restricitons, Obesity, Pain, Improper body mechanics, Postural dysfunction  Visit Diagnosis: Chronic bilateral low back pain with left-sided sciatica  Other abnormalities of gait and mobility  Muscle weakness (generalized)     Problem List Patient Active Problem List   Diagnosis Date Noted   Spondylolysis, lumbar region 08/19/2020   Spondylolisthesis, lumbar region 08/19/2020   Encounter for Medicare annual wellness exam 04/09/2019   Otitis  media 01/10/2019   Biventricular automatic implantable cardioverter defibrillator in situ 09/05/2018   Postablative hypothyroidism 09/05/2018   BMI 40.0-44.9, adult ([place]) 08/28/2018   Acute bronchitis 04/25/2018   Cough in adult 04/25/2018   Hyperlipidemia 02/22/2018   Testicular swelling, left 10/10/2017   HTN (hypertension) 08/25/2017   Anxiety 08/25/2017   Healthcare maintenance 08/25/2017   Localized swelling, mass or lump of neck 06/01/2017   Vitamin D deficiency 03/04/2017   Long term current use of anticoagulant 09/20/2016   Chronic diastolic heart failure ([place]) 02/02/2016   History of alcohol dependence ([place]) 02/02/2016   ICD (implantable cardioverter-defibrillator) battery depletion 04/17/2015   Congestive dilated cardiomyopathy ([place]) 07/15/2014   Chronic combined systolic and diastolic CHF, NYHA class 1 ([place]) 07/15/2014   OSA on CPAP 07/15/2014   Permanent atrial fibrillation ([place]) 07/15/2014   CHB (complete heart block) ([place]) 07/15/2014   Biventricular ICD  implanted 2007, generator change in 2012 07/15/2014   Tobacco abuse 07/15/2014   Alcoholism in recovery ([place]) 07/15/2014   Hypothyroidism, postradioiodine therapy 07/15/2014   Dyslipidemia (high LDL; low HDL) 07/15/2014    Silvestre Mesi 09/24/2020, 11:06 AM  [facility] Physical Therapy [address] [place], [place], [address] Phone: [phone]   Fax:  [phone]  Name: Brian Craig MRN: [id number] Date of Birth: [birth year]

## 2020-09-24 NOTE — Patient Instructions (Signed)
Access Code: [email or web address] URL: [email or web address] Date: 09/24/2020 Prepared by: Elsie Ra  Exercises Right Standing Lateral Shift Correction at [place] - 2 x daily - 6 x weekly - 3 sets - 10 reps Seated Sciatic Tensioner - 2 x daily - 6 x weekly - 10 reps - 1 sets - 3 hold Supine Lower Trunk Rotation - 2 x daily - 6 x weekly - 10 reps - 1 sets - 5 sec hold Hooklying Single Knee to Chest - 2 x daily - 6 x weekly - 3 reps - 1 sets - 30 hold Supine Figure 4 Piriformis Stretch - 2 x daily - 6 x weekly - 3 reps - 1 sets - 30 hold Supine Bridge - 2 x daily - 6 x weekly - 10 reps - 1-2 sets - 5 hold

## 2020-09-24 NOTE — Progress Notes (Signed)
Remote ICD transmission.   

## 2020-09-29 DIAGNOSIS — Z23 Encounter for immunization: Secondary | ICD-10-CM | POA: Diagnosis not present

## 2020-10-03 ENCOUNTER — Encounter: Payer: Medicare Other | Admitting: Physical Therapy

## 2020-10-03 ENCOUNTER — Other Ambulatory Visit: Payer: Self-pay | Admitting: Cardiovascular Disease

## 2020-10-06 ENCOUNTER — Other Ambulatory Visit: Payer: Self-pay | Admitting: Cardiovascular Disease

## 2020-10-10 ENCOUNTER — Encounter: Payer: Medicare Other | Admitting: Rehabilitative and Restorative Service Providers"

## 2020-10-13 ENCOUNTER — Ambulatory Visit (INDEPENDENT_AMBULATORY_CARE_PROVIDER_SITE_OTHER): Payer: Medicare Other | Admitting: Physical Therapy

## 2020-10-13 ENCOUNTER — Other Ambulatory Visit: Payer: Self-pay

## 2020-10-13 DIAGNOSIS — M5442 Lumbago with sciatica, left side: Secondary | ICD-10-CM

## 2020-10-13 DIAGNOSIS — G8929 Other chronic pain: Secondary | ICD-10-CM | POA: Diagnosis not present

## 2020-10-13 DIAGNOSIS — R2689 Other abnormalities of gait and mobility: Secondary | ICD-10-CM

## 2020-10-13 DIAGNOSIS — M6281 Muscle weakness (generalized): Secondary | ICD-10-CM | POA: Diagnosis not present

## 2020-10-13 NOTE — Therapy (Signed)
[facility] Physical Therapy [address] [place], [place], [address] Phone: [phone]   Fax:  [phone]  Physical Therapy Treatment  Patient Details  Name: Brian Craig MRN: [id number] Date of Birth: [birth year] Referring Provider (PT): Dr. Rodell Perna   Encounter Date: 10/13/2020   PT End of Session - 10/13/20 1243    Visit Number 2    Number of Visits 16    Date for PT Re-Evaluation 11/19/20    Authorization Type MCR/Tricare    PT Start Time 1140    PT Stop Time 1225    PT Time Calculation (min) 45 min    Activity Tolerance Patient tolerated treatment well    Behavior During Therapy [facility] for tasks assessed/performed           Past Medical History:  Diagnosis Date  . Atrial fibrillation ([place])   . CHF (congestive heart failure) ([place])   . Graves disease    Graves  . Hyperlipidemia   . Hypertension   . OSA treated with BiPAP   . Substance abuse ([place])    alcohol    Past Surgical History:  Procedure Laterality Date  . APPENDECTOMY    . BIV ICD GENERTAOR CHANGE OUT Left 2012   Medtronic Protecta  . CARDIAC ELECTROPHYSIOLOGY STUDY AND ABLATION    . EP IMPLANTABLE DEVICE N/A 05/06/2015   Procedure: ICD Generator Changeout;  Surgeon: Sanda Klein, MD;  Location: [place] [facility];  Service: Cardiovascular;  Laterality: N/A;  . HERNIA REPAIR    . MENISCUS REPAIR Right   . VASECTOMY     unilateral due to injury    There were no vitals filed for this visit.   Subjective Assessment - 10/13/20 1155    Subjective relays about 2/10 LBP with radiculopathy today    Pertinent History pacemaker,afib with ICD, CHF,graves dx, HTN, OSA, hx alcohol abuse    Limitations Sitting;Lifting;Standing;Walking    How long can you sit comfortably? depends    How long can you stand comfortably? 10 min    How long can you walk comfortably? 10 min    Diagnostic tests AP lateral lumbar spine x-rays are obtained and reviewed.  This shows grade 2 L5-S1  spondylolisthesis with disc space narrowing and bilateral pars defects.  There is some calcification of the abdominal aorta.  Negative for acute    Patient Stated Goals reduce pain    Pain Onset More than a month ago            [facility] Adult PT Treatment/Exercise - 10/13/20 0001      Exercises   Exercises Lumbar      Lumbar Exercises: Stretches   Single Knee to Chest Stretch Right;Left;2 reps;30 seconds    Lower Trunk Rotation Limitations 5 sec X 10 reps bilat    Piriformis Stretch Left;3 reps;30 seconds    Gastroc Stretch Right;Left;3 reps;30 seconds    Gastroc Stretch Limitations slantboard    Other Lumbar Stretch Exercise seated slump stretch 2 sets of 10    Other Lumbar Stretch Exercise lateral shift correction X 10 reps      Lumbar Exercises: Aerobic   Nustep 6 min L6       Lumbar Exercises: Standing   Row 20 reps    Theraband Level (Row) Level 3 (Green)    Shoulder Extension Both;20 reps    Theraband Level (Shoulder Extension) Level 3 (Green)      Lumbar Exercises: Supine   Clam 20 reps    Clam Limitations green  Bent Knee Raise Limitations up, up, then down down X 10 reps ea with TAC    Bridge 15 reps;5 seconds      Manual Therapy   Manual therapy comments Lt leg LAD 1 min X 3 reps                    PT Short Term Goals - 09/24/20 1056      PT SHORT TERM GOAL #1   Title Pt will be I and compliant with HEP.    Time 4    Period Weeks    Status New    Target Date 10/22/20             PT Long Term Goals - 09/24/20 1056      PT LONG TERM GOAL #1   Title Pt will improve lumbar ROM to [facility].    Time 8    Period Weeks    Status New    Target Date 11/19/20      PT LONG TERM GOAL #2   Title Pt will report less radicular symptoms into left leg and overall no more than 3/10 pain overall    Time 8    Period Weeks    Status New      PT LONG TERM GOAL #3   Title Pt will improve FOTO score to 60% functional outcome.                 Plan  - 10/13/20 1243    Clinical Impression Statement Session focused on HEP review and he did appear to have less centralization of symptoms after session. PT will continue to progress as able.    Personal Factors and Comorbidities Comorbidity 3+    Comorbidities pacemaker,afib with ICD, CHF,graves dx, HTN, OSA, hx alcohol abuse    Examination-Activity Limitations Bed Mobility;Bend;Carry;Lift;Stand;Sleep;Squat;Locomotion Level    Examination-Participation Restrictions Cleaning;Community Activity;Driving;Laundry;Yard Work;Shop;Occupation    Stability/Clinical Decision Making Evolving/Moderate complexity    Rehab Potential Good    PT Frequency 2x / week   1-2   PT Duration 8 weeks    PT Treatment/Interventions ADLs/Self Care Home Management;Cryotherapy;Moist Heat;Traction;Therapeutic activities;Therapeutic exercise;Neuromuscular re-education;Gait training;Manual techniques;Passive range of motion;Dry needling;Joint Manipulations;Spinal Manipulations;Taping    PT Next Visit Plan review and update HEP PRN, no electric modalaties due to pacemaker, possible lateral shift?    PT Home Exercise Plan Access Code: [email or web address]    [email or web address] and Agree with Plan of Care Patient           Patient will benefit from skilled therapeutic intervention in order to improve the following deficits and impairments:  Decreased activity tolerance, Decreased endurance, Decreased range of motion, Decreased strength, Difficulty walking, Hypomobility, Impaired flexibility, Increased muscle spasms, Increased fascial restricitons, Obesity, Pain, Improper body mechanics, Postural dysfunction  Visit Diagnosis: Chronic bilateral low back pain with left-sided sciatica  Other abnormalities of gait and mobility  Muscle weakness (generalized)     Problem List Patient Active Problem List   Diagnosis Date Noted  . Spondylolysis, lumbar region 08/19/2020  . Spondylolisthesis, lumbar region 08/19/2020  . Encounter for Medicare  annual wellness exam 04/09/2019  . Otitis media 01/10/2019  . Biventricular automatic implantable cardioverter defibrillator in situ 09/05/2018  . Postablative hypothyroidism 09/05/2018  . BMI 40.0-44.9, adult ([place]) 08/28/2018  . Acute bronchitis 04/25/2018  . Cough in adult 04/25/2018  . Hyperlipidemia 02/22/2018  . Testicular swelling, left 10/10/2017  . HTN (hypertension) 08/25/2017  . Anxiety 08/25/2017  . Healthcare maintenance 08/25/2017  .  Localized swelling, mass or lump of neck 06/01/2017  . Vitamin D deficiency 03/04/2017  . Long term current use of anticoagulant 09/20/2016  . Chronic diastolic heart failure ([place]) 02/02/2016  . History of alcohol dependence ([place]) 02/02/2016  . ICD (implantable cardioverter-defibrillator) battery depletion 04/17/2015  . Congestive dilated cardiomyopathy ([place]) 07/15/2014  . Chronic combined systolic and diastolic CHF, NYHA class 1 ([place]) 07/15/2014  . OSA on CPAP 07/15/2014  . Permanent atrial fibrillation ([place]) 07/15/2014  . CHB (complete heart block) ([place]) 07/15/2014  . Biventricular ICD  implanted 2007, generator change in 2012 07/15/2014  . Tobacco abuse 07/15/2014  . Alcoholism in recovery ([place]) 07/15/2014  . Hypothyroidism, postradioiodine therapy 07/15/2014  . Dyslipidemia (high LDL; low HDL) 07/15/2014    Silvestre Mesi 10/13/2020, 12:45 PM  [facility] Physical Therapy [address] [place], [place], [address] Phone: [phone]   Fax:  [phone]  Name: Chay Mazzoni MRN: [id number] Date of Birth: August 31, 1951

## 2020-10-20 ENCOUNTER — Other Ambulatory Visit: Payer: Self-pay

## 2020-10-20 ENCOUNTER — Encounter: Payer: Self-pay | Admitting: Physical Therapy

## 2020-10-20 ENCOUNTER — Ambulatory Visit (INDEPENDENT_AMBULATORY_CARE_PROVIDER_SITE_OTHER): Payer: Medicare Other | Admitting: Physical Therapy

## 2020-10-20 DIAGNOSIS — R2689 Other abnormalities of gait and mobility: Secondary | ICD-10-CM | POA: Diagnosis not present

## 2020-10-20 DIAGNOSIS — G8929 Other chronic pain: Secondary | ICD-10-CM

## 2020-10-20 DIAGNOSIS — M6281 Muscle weakness (generalized): Secondary | ICD-10-CM

## 2020-10-20 DIAGNOSIS — M5442 Lumbago with sciatica, left side: Secondary | ICD-10-CM

## 2020-10-20 NOTE — Therapy (Signed)
[facility] Physical Therapy [address] [place], [place], [address] Phone: [phone]   Fax:  [phone]  Physical Therapy Treatment  Patient Details  Name: Brian Craig MRN: [id number] Date of Birth: 1951/11/18 Referring Provider (PT): Dr. Rodell Perna   Encounter Date: 10/20/2020   PT End of Session - 10/20/20 1343    Visit Number 3    Number of Visits 16    Date for PT Re-Evaluation 11/19/20    Authorization Type MCR/Tricare    PT Start Time 1300    PT Stop Time 1345    PT Time Calculation (min) 45 min    Activity Tolerance Patient tolerated treatment well    Behavior During Therapy [facility] for tasks assessed/performed           Past Medical History:  Diagnosis Date   Atrial fibrillation ([place])    CHF (congestive heart failure) ([place])    Graves disease    Graves   Hyperlipidemia    Hypertension    OSA treated with BiPAP    Substance abuse ([place])    alcohol    Past Surgical History:  Procedure Laterality Date   APPENDECTOMY     BIV ICD GENERTAOR CHANGE OUT Left 2012   Medtronic Protecta   CARDIAC ELECTROPHYSIOLOGY STUDY AND ABLATION     EP IMPLANTABLE DEVICE N/A 05/06/2015   Procedure: ICD Generator Changeout;  Surgeon: Sanda Klein, MD;  Location: [place] [facility];  Service: Cardiovascular;  Laterality: N/A;   HERNIA REPAIR     MENISCUS REPAIR Right    VASECTOMY     unilateral due to injury    There were no vitals filed for this visit.   Subjective Assessment - 10/20/20 1311    Subjective relays the pain is getting a little better.    Pertinent History pacemaker,afib with ICD, CHF,graves dx, HTN, OSA, hx alcohol abuse    Limitations Sitting;Lifting;Standing;Walking    How long can you sit comfortably? depends    How long can you stand comfortably? 10 min    How long can you walk comfortably? 10 min    Diagnostic tests AP lateral lumbar spine x-rays are obtained and reviewed.  This shows grade 2 L5-S1 spondylolisthesis  with disc space narrowing and bilateral pars defects.  There is some calcification of the abdominal aorta.  Negative for acute    Patient Stated Goals reduce pain    Pain Onset More than a month ago                             [facility] Adult PT Treatment/Exercise - 10/20/20 0001      Lumbar Exercises: Stretches   Single Knee to Chest Stretch Right;Left;2 reps;30 seconds    Lower Trunk Rotation Limitations 5 sec X 10 reps bilat    Piriformis Stretch Right;Left;2 reps;30 seconds    Gastroc Stretch Right;Left;3 reps;30 seconds    Other Lumbar Stretch Exercise seated slump stretch 2 sets of 10      Lumbar Exercises: Aerobic   Recumbent Bike L2 X 6 min      Lumbar Exercises: Standing   Row Both    Theraband Level (Row) Level 3 (Green)    Row Limitations 2X15    Shoulder Extension Both    Theraband Level (Shoulder Extension) Level 3 (Green)    Shoulder Extension Limitations 2X15    Other Standing Lumbar Exercises anti rotation press/piloff press green  X15 reps bilat  Lumbar Exercises: Supine   Clam 20 reps    Clam Limitations green    Bent Knee Raise Limitations --    Bridge 5 seconds    Bridge Limitations 2 sets of 10      Manual Therapy   Manual therapy comments bilat leg LAD 1 min X 3 reps                    PT Short Term Goals - 09/24/20 1056      PT SHORT TERM GOAL #1   Title Pt will be I and compliant with HEP.    Time 4    Period Weeks    Status New    Target Date 10/22/20             PT Long Term Goals - 09/24/20 1056      PT LONG TERM GOAL #1   Title Pt will improve lumbar ROM to [facility].    Time 8    Period Weeks    Status New    Target Date 11/19/20      PT LONG TERM GOAL #2   Title Pt will report less radicular symptoms into left leg and overall no more than 3/10 pain overall    Time 8    Period Weeks    Status New      PT LONG TERM GOAL #3   Title Pt will improve FOTO score to 60% functional outcome.                  Plan - 10/20/20 1344    Clinical Impression Statement Continued with progression of core strength and lumbar stretching as toleated. He appears to be making some early progress with PT. Continue POC    Personal Factors and Comorbidities Comorbidity 3+    Comorbidities pacemaker,afib with ICD, CHF,graves dx, HTN, OSA, hx alcohol abuse    Examination-Activity Limitations Bed Mobility;Bend;Carry;Lift;Stand;Sleep;Squat;Locomotion Level    Examination-Participation Restrictions Cleaning;Community Activity;Driving;Laundry;Yard Work;Shop;Occupation    Stability/Clinical Decision Making Evolving/Moderate complexity    Rehab Potential Good    PT Frequency 2x / week   1-2   PT Duration 8 weeks    PT Treatment/Interventions ADLs/Self Care Home Management;Cryotherapy;Moist Heat;Traction;Therapeutic activities;Therapeutic exercise;Neuromuscular re-education;Gait training;Manual techniques;Passive range of motion;Dry needling;Joint Manipulations;Spinal Manipulations;Taping    PT Next Visit Plan review and update HEP PRN, no electric modalaties due to pacemaker, possible lateral shift?    PT Home Exercise Plan Access Code: [email or web address]    [email or web address] and Agree with Plan of Care Patient           Patient will benefit from skilled therapeutic intervention in order to improve the following deficits and impairments:  Decreased activity tolerance, Decreased endurance, Decreased range of motion, Decreased strength, Difficulty walking, Hypomobility, Impaired flexibility, Increased muscle spasms, Increased fascial restricitons, Obesity, Pain, Improper body mechanics, Postural dysfunction  Visit Diagnosis: Chronic bilateral low back pain with left-sided sciatica  Other abnormalities of gait and mobility  Muscle weakness (generalized)     Problem List Patient Active Problem List   Diagnosis Date Noted   Spondylolysis, lumbar region 08/19/2020   Spondylolisthesis, lumbar region 08/19/2020    Encounter for Medicare annual wellness exam 04/09/2019   Otitis media 01/10/2019   Biventricular automatic implantable cardioverter defibrillator in situ 09/05/2018   Postablative hypothyroidism 09/05/2018   BMI 40.0-44.9, adult ([place]) 08/28/2018   Acute bronchitis 04/25/2018   Cough in adult 04/25/2018   Hyperlipidemia 02/22/2018   Testicular swelling, left 10/10/2017  HTN (hypertension) 08/25/2017   Anxiety 08/25/2017   Healthcare maintenance 08/25/2017   Localized swelling, mass or lump of neck 06/01/2017   Vitamin D deficiency 03/04/2017   Long term current use of anticoagulant 09/20/2016   Chronic diastolic heart failure ([place]) 02/02/2016   History of alcohol dependence ([place]) 02/02/2016   ICD (implantable cardioverter-defibrillator) battery depletion 04/17/2015   Congestive dilated cardiomyopathy ([place]) 07/15/2014   Chronic combined systolic and diastolic CHF, NYHA class 1 ([place]) 07/15/2014   OSA on CPAP 07/15/2014   Permanent atrial fibrillation ([place]) 07/15/2014   CHB (complete heart block) ([place]) 07/15/2014   Biventricular ICD  implanted 2007, generator change in 2012 07/15/2014   Tobacco abuse 07/15/2014   Alcoholism in recovery ([place]) 07/15/2014   Hypothyroidism, postradioiodine therapy 07/15/2014   Dyslipidemia (high LDL; low HDL) 07/15/2014    Silvestre Mesi 10/20/2020, 1:47 PM  [facility] Physical Therapy [address] [place], [place], [address] Phone: [phone]   Fax:  [phone]  Name: Vadim Centola MRN: [id number] Date of Birth: 1951-03-24

## 2020-10-27 ENCOUNTER — Ambulatory Visit (INDEPENDENT_AMBULATORY_CARE_PROVIDER_SITE_OTHER): Payer: Medicare Other | Admitting: Physical Therapy

## 2020-10-27 ENCOUNTER — Other Ambulatory Visit: Payer: Self-pay

## 2020-10-27 DIAGNOSIS — G8929 Other chronic pain: Secondary | ICD-10-CM | POA: Diagnosis not present

## 2020-10-27 DIAGNOSIS — R2689 Other abnormalities of gait and mobility: Secondary | ICD-10-CM | POA: Diagnosis not present

## 2020-10-27 DIAGNOSIS — M6281 Muscle weakness (generalized): Secondary | ICD-10-CM

## 2020-10-27 DIAGNOSIS — M5442 Lumbago with sciatica, left side: Secondary | ICD-10-CM | POA: Diagnosis not present

## 2020-10-27 NOTE — Therapy (Signed)
[facility] Physical Therapy [address] [place], [place], [address] Phone: [phone]   Fax:  [phone]  Physical Therapy Treatment  Patient Details  Name: Brian Craig MRN: [id number] Date of Birth: June 27, 1951 Referring Provider (PT): Dr. Rodell Perna   Encounter Date: 10/27/2020   PT End of Session - 10/27/20 1134    Visit Number 4    Number of Visits 16    Date for PT Re-Evaluation 11/19/20    Authorization Type MCR/Tricare    PT Start Time 1100    PT Stop Time 1139    PT Time Calculation (min) 39 min    Activity Tolerance Patient tolerated treatment well    Behavior During Therapy [facility] for tasks assessed/performed           Past Medical History:  Diagnosis Date   Atrial fibrillation ([place])    CHF (congestive heart failure) ([place])    Graves disease    Graves   Hyperlipidemia    Hypertension    OSA treated with BiPAP    Substance abuse ([place])    alcohol    Past Surgical History:  Procedure Laterality Date   APPENDECTOMY     BIV ICD GENERTAOR CHANGE OUT Left 2012   Medtronic Protecta   CARDIAC ELECTROPHYSIOLOGY STUDY AND ABLATION     EP IMPLANTABLE DEVICE N/A 05/06/2015   Procedure: ICD Generator Changeout;  Surgeon: Sanda Klein, MD;  Location: [place] [facility];  Service: Cardiovascular;  Laterality: N/A;   HERNIA REPAIR     MENISCUS REPAIR Right    VASECTOMY     unilateral due to injury    There were no vitals filed for this visit.   Subjective Assessment - 10/27/20 1132    Subjective relays his back feels ok, mild to mod pain overall    Pertinent History pacemaker,afib with ICD, CHF,graves dx, HTN, OSA, hx alcohol abuse    Limitations Sitting;Lifting;Standing;Walking    How long can you sit comfortably? depends    How long can you stand comfortably? 10 min    How long can you walk comfortably? 10 min    Diagnostic tests AP lateral lumbar spine x-rays are obtained and reviewed.  This shows grade 2 L5-S1  spondylolisthesis with disc space narrowing and bilateral pars defects.  There is some calcification of the abdominal aorta.  Negative for acute    Patient Stated Goals reduce pain    Pain Onset More than a month ago              [facility] PT Assessment - 10/27/20 0001      AROM   Lumbar Flexion WNL    Lumbar Extension 30%    Lumbar - Right Side Bend 60%    Lumbar - Left Side Bend 60%    Lumbar - Right Rotation 75%    Lumbar - Left Rotation 69%      Strength   Overall Strength Comments leg strength WFL bilat                         [facility] Adult PT Treatment/Exercise - 10/27/20 0001      Lumbar Exercises: Stretches   Single Knee to Chest Stretch Right;Left;2 reps;30 seconds    Lower Trunk Rotation Limitations 5 sec X 10 reps bilat    Piriformis Stretch Right;Left;2 reps;30 seconds    Other Lumbar Stretch Exercise seated slump stretch 2 sets of 10    Other Lumbar Stretch Exercise seated Pball roll  outs 10 sec X 10 reps      Lumbar Exercises: Aerobic   Recumbent Bike L2 X 6 min      Lumbar Exercises: Standing   Row Both    Theraband Level (Row) Level 4 (Blue)    Row Limitations 2X15    Shoulder Extension Both    Theraband Level (Shoulder Extension) Level 4 (Blue)    Shoulder Extension Limitations 2X15    Other Standing Lumbar Exercises anti rotation press/piloff press green  X15 reps bilat      Lumbar Exercises: Supine   Clam 20 reps    Clam Limitations green    Bent Knee Raise 20 reps    Bent Knee Raise Limitations green    Bridge 5 seconds    Bridge Limitations 2 sets of 10                    PT Short Term Goals - 09/24/20 1056      PT SHORT TERM GOAL #1   Title Pt will be I and compliant with HEP.    Time 4    Period Weeks    Status New    Target Date 10/22/20             PT Long Term Goals - 09/24/20 1056      PT LONG TERM GOAL #1   Title Pt will improve lumbar ROM to [facility].    Time 8    Period Weeks    Status New    Target  Date 11/19/20      PT LONG TERM GOAL #2   Title Pt will report less radicular symptoms into left leg and overall no more than 3/10 pain overall    Time 8    Period Weeks    Status New      PT LONG TERM GOAL #3   Title Pt will improve FOTO score to 60% functional outcome.                 Plan - 10/27/20 1136    Clinical Impression Statement his lumbar ROM improved today with visual assessment. He continues to have good activity tolerance and PT will continue to progress as able.    Personal Factors and Comorbidities Comorbidity 3+    Comorbidities pacemaker,afib with ICD, CHF,graves dx, HTN, OSA, hx alcohol abuse    Examination-Activity Limitations Bed Mobility;Bend;Carry;Lift;Stand;Sleep;Squat;Locomotion Level    Examination-Participation Restrictions Cleaning;Community Activity;Driving;Laundry;Yard Work;Shop;Occupation    Stability/Clinical Decision Making Evolving/Moderate complexity    Rehab Potential Good    PT Frequency 2x / week   1-2   PT Duration 8 weeks    PT Treatment/Interventions ADLs/Self Care Home Management;Cryotherapy;Moist Heat;Traction;Therapeutic activities;Therapeutic exercise;Neuromuscular re-education;Gait training;Manual techniques;Passive range of motion;Dry needling;Joint Manipulations;Spinal Manipulations;Taping    PT Next Visit Plan lumbar stretching and strengthening,  no electric modalaties due to pacemaker, possible lateral shift?    PT Home Exercise Plan Access Code: [email or web address]    [email or web address] and Agree with Plan of Care Patient           Patient will benefit from skilled therapeutic intervention in order to improve the following deficits and impairments:  Decreased activity tolerance, Decreased endurance, Decreased range of motion, Decreased strength, Difficulty walking, Hypomobility, Impaired flexibility, Increased muscle spasms, Increased fascial restricitons, Obesity, Pain, Improper body mechanics, Postural dysfunction  Visit Diagnosis: Chronic  bilateral low back pain with left-sided sciatica  Other abnormalities of gait and mobility  Muscle weakness (generalized)     Problem  List Patient Active Problem List   Diagnosis Date Noted   Spondylolysis, lumbar region 08/19/2020   Spondylolisthesis, lumbar region 08/19/2020   Encounter for Medicare annual wellness exam 04/09/2019   Otitis media 01/10/2019   Biventricular automatic implantable cardioverter defibrillator in situ 09/05/2018   Postablative hypothyroidism 09/05/2018   BMI 40.0-44.9, adult ([place]) 08/28/2018   Acute bronchitis 04/25/2018   Cough in adult 04/25/2018   Hyperlipidemia 02/22/2018   Testicular swelling, left 10/10/2017   HTN (hypertension) 08/25/2017   Anxiety 08/25/2017   Healthcare maintenance 08/25/2017   Localized swelling, mass or lump of neck 06/01/2017   Vitamin D deficiency 03/04/2017   Long term current use of anticoagulant 09/20/2016   Chronic diastolic heart failure ([place]) 02/02/2016   History of alcohol dependence ([place]) 02/02/2016   ICD (implantable cardioverter-defibrillator) battery depletion 04/17/2015   Congestive dilated cardiomyopathy ([place]) 07/15/2014   Chronic combined systolic and diastolic CHF, NYHA class 1 ([place]) 07/15/2014   OSA on CPAP 07/15/2014   Permanent atrial fibrillation ([place]) 07/15/2014   CHB (complete heart block) ([place]) 07/15/2014   Biventricular ICD  implanted 2007, generator change in 2012 07/15/2014   Tobacco abuse 07/15/2014   Alcoholism in recovery ([place]) 07/15/2014   Hypothyroidism, postradioiodine therapy 07/15/2014   Dyslipidemia (high LDL; low HDL) 07/15/2014    Silvestre Mesi 10/27/2020, 12:27 PM  [facility] Physical Therapy [address] [place], [place], [address] Phone: [phone]   Fax:  [phone]  Name: Brian Craig MRN: [id number] Date of Birth: [birth year]

## 2020-11-03 ENCOUNTER — Encounter: Payer: Medicare Other | Admitting: Physical Therapy

## 2020-11-10 ENCOUNTER — Telehealth: Payer: Self-pay | Admitting: Physician Assistant

## 2020-11-10 MED ORDER — SERTRALINE HCL 50 MG PO TABS
50.0000 mg | ORAL_TABLET | Freq: Every day | ORAL | 0 refills | Status: DC
Start: 2020-11-10 — End: 2020-12-25

## 2020-11-10 NOTE — Telephone Encounter (Signed)
Please call patient to schedule OV for further med refills. AS, CMA 

## 2020-11-14 ENCOUNTER — Other Ambulatory Visit: Payer: Self-pay | Admitting: Cardiovascular Disease

## 2020-11-17 ENCOUNTER — Encounter: Payer: Medicare Other | Admitting: Physical Therapy

## 2020-11-24 ENCOUNTER — Encounter: Payer: Self-pay | Admitting: Physical Therapy

## 2020-11-24 ENCOUNTER — Other Ambulatory Visit: Payer: Self-pay

## 2020-11-24 ENCOUNTER — Ambulatory Visit (INDEPENDENT_AMBULATORY_CARE_PROVIDER_SITE_OTHER): Payer: Medicare Other | Admitting: Physical Therapy

## 2020-11-24 DIAGNOSIS — G8929 Other chronic pain: Secondary | ICD-10-CM | POA: Diagnosis not present

## 2020-11-24 DIAGNOSIS — M5442 Lumbago with sciatica, left side: Secondary | ICD-10-CM

## 2020-11-24 DIAGNOSIS — R2689 Other abnormalities of gait and mobility: Secondary | ICD-10-CM | POA: Diagnosis not present

## 2020-11-24 DIAGNOSIS — M6281 Muscle weakness (generalized): Secondary | ICD-10-CM

## 2020-11-24 NOTE — Patient Instructions (Signed)
Access Code: [email or web address] URL: [email or web address] Date: 11/24/2020 Prepared by: Elsie Ra  Exercises Seated Sciatic Tensioner - 2 x daily - 6 x weekly - 10 reps - 1 sets - 3 hold Supine Lower Trunk Rotation - 2 x daily - 6 x weekly - 10 reps - 1 sets - 5 sec hold Hooklying Single Knee to Chest - 2 x daily - 6 x weekly - 3 reps - 1 sets - 30 hold Supine Figure 4 Piriformis Stretch - 2 x daily - 6 x weekly - 3 reps - 1 sets - 30 hold Supine Bridge - 2 x daily - 6 x weekly - 10 reps - 1-2 sets - 5 hold Standing Row with Anchored Resistance - 2 x daily - 6 x weekly - 10-20 reps - 2-3 sets Shoulder extension with resistance - Neutral - 2 x daily - 6 x weekly - 10 reps - 2-3 sets Standing Anti-Rotation Press with Anchored Resistance - 2 x daily - 6 x weekly - 1-2 sets - 15-20 reps

## 2020-11-24 NOTE — Therapy (Signed)
[facility] Physical Therapy [address] [place], [place], [address] Phone: [phone]   Fax:  [phone]  Physical Therapy Treatment/Discharge PHYSICAL THERAPY DISCHARGE SUMMARY  Visits from Start of Care: 5  Current functional level related to goals / functional outcomes: See below   Remaining deficits: See below   Education / Equipment: HEP Plan: Patient agrees to discharge.  Patient goals were met. Patient is being discharged due to being pleased with the current functional level.  ?????       Patient Details  Name: Brian Craig MRN: [id number] Date of Birth: 17-May-1951 Referring Provider (PT): Dr. Rodell Perna   Encounter Date: 11/24/2020   PT End of Session - 11/24/20 1339    Visit Number 5    Number of Visits 16    Date for PT Re-Evaluation 11/19/20    Authorization Type MCR/Tricare    PT Start Time 1300    PT Stop Time 1338    PT Time Calculation (min) 38 min    Activity Tolerance Patient tolerated treatment well    Behavior During Therapy [facility] for tasks assessed/performed           Past Medical History:  Diagnosis Date   Atrial fibrillation ([place])    CHF (congestive heart failure) ([place])    Graves disease    Graves   Hyperlipidemia    Hypertension    OSA treated with BiPAP    Substance abuse ([place])    alcohol    Past Surgical History:  Procedure Laterality Date   APPENDECTOMY     BIV ICD GENERTAOR CHANGE OUT Left 2012   Medtronic Protecta   CARDIAC ELECTROPHYSIOLOGY STUDY AND ABLATION     EP IMPLANTABLE DEVICE N/A 05/06/2015   Procedure: ICD Generator Changeout;  Surgeon: Sanda Klein, MD;  Location: [place] [facility];  Service: Cardiovascular;  Laterality: N/A;   HERNIA REPAIR     MENISCUS REPAIR Right    VASECTOMY     unilateral due to injury    There were no vitals filed for this visit.   Subjective Assessment - 11/24/20 1321    Subjective Relays his back is doing well today. He has no pain or  radiculopathy and feels ready to discharge.    Pertinent History pacemaker,afib with ICD, CHF,graves dx, HTN, OSA, hx alcohol abuse    Limitations Sitting;Lifting;Standing;Walking    How long can you sit comfortably? depends    How long can you stand comfortably? 10 min    How long can you walk comfortably? 10 min    Diagnostic tests AP lateral lumbar spine x-rays are obtained and reviewed.  This shows grade 2 L5-S1 spondylolisthesis with disc space narrowing and bilateral pars defects.  There is some calcification of the abdominal aorta.  Negative for acute    Patient Stated Goals reduce pain    Pain Onset More than a month ago              [facility] PT Assessment - 11/24/20 0001      Assessment   Medical Diagnosis Low back pain, with Lt radiculopathy, spondylolisthesis    Referring Provider (PT) Dr. Rodell Perna      Observation/Other Assessments   Focus on Therapeutic Outcomes (FOTO)  59%      AROM   Overall AROM Comments lumbar ROM now WNL and painfree      Strength   Overall Strength Comments leg strength WFL bilat  [facility] Adult PT Treatment/Exercise - 11/24/20 0001      Lumbar Exercises: Stretches   Single Knee to Chest Stretch Right;Left;2 reps;30 seconds    Lower Trunk Rotation Limitations 5 sec X 10 reps bilat    Gastroc Stretch Right;Left;3 reps;30 seconds    Other Lumbar Stretch Exercise seated Pball roll outs 10 sec X 10 reps      Lumbar Exercises: Aerobic   Nustep 8 min L6      Lumbar Exercises: Standing   Row Both    Theraband Level (Row) Level 4 (Blue)    Row Limitations 2X15    Shoulder Extension Both    Theraband Level (Shoulder Extension) Level 4 (Blue)    Shoulder Extension Limitations 2X15    Other Standing Lumbar Exercises anti rotation press/piloff press green  X15 reps bilat                    PT Short Term Goals - 11/24/20 1341      PT SHORT TERM GOAL #1   Title Pt will be I and compliant with  HEP.    Time 4    Period Weeks    Status Achieved    Target Date 10/22/20             PT Long Term Goals - 11/24/20 1325      PT LONG TERM GOAL #1   Title Pt will improve lumbar ROM to [facility].    Time 8    Period Weeks    Status Achieved      PT LONG TERM GOAL #2   Title Pt will report less radicular symptoms into left leg and overall no more than 3/10 pain overall    Time 8    Period Weeks    Status Achieved      PT LONG TERM GOAL #3   Title Improve FOTO score to 60%    Baseline today scored 59%    Status Partially Met                 Plan - 11/24/20 1339    Clinical Impression Statement He has made great progress with PT and has met his PT goals. He feels confident he can maintain at home with HEP. HEP was updated today and he showed good understanding. He will be discharged due to being pleased with progress and he had no further questions or concerns.    Personal Factors and Comorbidities Comorbidity 3+    Comorbidities pacemaker,afib with ICD, CHF,graves dx, HTN, OSA, hx alcohol abuse    Examination-Activity Limitations Bed Mobility;Bend;Carry;Lift;Stand;Sleep;Squat;Locomotion Level    Examination-Participation Restrictions Cleaning;Community Activity;Driving;Laundry;Yard Work;Shop;Occupation    Stability/Clinical Decision Making Evolving/Moderate complexity    Rehab Potential Good    PT Frequency 2x / week   1-2   PT Duration 8 weeks    PT Treatment/Interventions ADLs/Self Care Home Management;Cryotherapy;Moist Heat;Traction;Therapeutic activities;Therapeutic exercise;Neuromuscular re-education;Gait training;Manual techniques;Passive range of motion;Dry needling;Joint Manipulations;Spinal Manipulations;Taping    PT Next Visit Plan discharge today    PT Home Exercise Plan Access Code: [email or web address]    Consulted and Agree with Plan of Care Patient           Patient will benefit from skilled therapeutic intervention in order to improve the following deficits and  impairments:  Decreased activity tolerance,Decreased endurance,Decreased range of motion,Decreased strength,Difficulty walking,Hypomobility,Impaired flexibility,Increased muscle spasms,Increased fascial restricitons,Obesity,Pain,Improper body mechanics,Postural dysfunction  Visit Diagnosis: Chronic bilateral low back pain with left-sided sciatica  Other abnormalities of gait and  mobility  Muscle weakness (generalized)     Problem List Patient Active Problem List   Diagnosis Date Noted   Spondylolysis, lumbar region 08/19/2020   Spondylolisthesis, lumbar region 08/19/2020   Encounter for Medicare annual wellness exam 04/09/2019   Otitis media 01/10/2019   Biventricular automatic implantable cardioverter defibrillator in situ 09/05/2018   Postablative hypothyroidism 09/05/2018   BMI 40.0-44.9, adult ([place]) 08/28/2018   Acute bronchitis 04/25/2018   Cough in adult 04/25/2018   Hyperlipidemia 02/22/2018   Testicular swelling, left 10/10/2017   HTN (hypertension) 08/25/2017   Anxiety 08/25/2017   Healthcare maintenance 08/25/2017   Localized swelling, mass or lump of neck 06/01/2017   Vitamin D deficiency 03/04/2017   Long term current use of anticoagulant 09/20/2016   Chronic diastolic heart failure ([place]) 02/02/2016   History of alcohol dependence ([place]) 02/02/2016   ICD (implantable cardioverter-defibrillator) battery depletion 04/17/2015   Congestive dilated cardiomyopathy ([place]) 07/15/2014   Chronic combined systolic and diastolic CHF, NYHA class 1 ([place]) 07/15/2014   OSA on CPAP 07/15/2014   Permanent atrial fibrillation ([place]) 07/15/2014   CHB (complete heart block) ([place]) 07/15/2014   Biventricular ICD  implanted 2007, generator change in 2012 07/15/2014   Tobacco abuse 07/15/2014   Alcoholism in recovery ([place]) 07/15/2014   Hypothyroidism, postradioiodine therapy 07/15/2014   Dyslipidemia (high LDL; low HDL) 07/15/2014    Silvestre Mesi 11/24/2020, 1:49 PM  [facility] Physical Therapy [address] [place], [place], [address] Phone: [phone]   Fax:  [phone]  Name: Brian Craig MRN: [id number] Date of Birth: 05/18/1951

## 2020-12-18 ENCOUNTER — Telehealth: Payer: Self-pay | Admitting: Physician Assistant

## 2020-12-18 NOTE — Telephone Encounter (Signed)
Returned call to patient from [facility]- left message

## 2020-12-23 ENCOUNTER — Encounter: Payer: Self-pay | Admitting: Cardiology

## 2020-12-23 ENCOUNTER — Other Ambulatory Visit: Payer: Self-pay

## 2020-12-23 ENCOUNTER — Ambulatory Visit (INDEPENDENT_AMBULATORY_CARE_PROVIDER_SITE_OTHER): Payer: Medicare Other

## 2020-12-23 ENCOUNTER — Ambulatory Visit (INDEPENDENT_AMBULATORY_CARE_PROVIDER_SITE_OTHER): Payer: Medicare Other | Admitting: Cardiology

## 2020-12-23 VITALS — BP 112/78 | HR 79 | Ht 73.0 in | Wt 332.6 lb

## 2020-12-23 DIAGNOSIS — R0602 Shortness of breath: Secondary | ICD-10-CM | POA: Diagnosis not present

## 2020-12-23 DIAGNOSIS — I2510 Atherosclerotic heart disease of native coronary artery without angina pectoris: Secondary | ICD-10-CM | POA: Diagnosis not present

## 2020-12-23 DIAGNOSIS — I420 Dilated cardiomyopathy: Secondary | ICD-10-CM | POA: Diagnosis not present

## 2020-12-23 DIAGNOSIS — I10 Essential (primary) hypertension: Secondary | ICD-10-CM

## 2020-12-23 DIAGNOSIS — I4821 Permanent atrial fibrillation: Secondary | ICD-10-CM | POA: Diagnosis not present

## 2020-12-23 DIAGNOSIS — Z6841 Body Mass Index (BMI) 40.0 and over, adult: Secondary | ICD-10-CM | POA: Diagnosis not present

## 2020-12-23 DIAGNOSIS — J439 Emphysema, unspecified: Secondary | ICD-10-CM | POA: Insufficient documentation

## 2020-12-23 DIAGNOSIS — G4733 Obstructive sleep apnea (adult) (pediatric): Secondary | ICD-10-CM

## 2020-12-23 DIAGNOSIS — Z9989 Dependence on other enabling machines and devices: Secondary | ICD-10-CM | POA: Diagnosis not present

## 2020-12-23 DIAGNOSIS — Z95810 Presence of automatic (implantable) cardiac defibrillator: Secondary | ICD-10-CM

## 2020-12-23 DIAGNOSIS — E785 Hyperlipidemia, unspecified: Secondary | ICD-10-CM | POA: Diagnosis not present

## 2020-12-23 DIAGNOSIS — R0600 Dyspnea, unspecified: Secondary | ICD-10-CM | POA: Insufficient documentation

## 2020-12-23 DIAGNOSIS — Z7901 Long term (current) use of anticoagulants: Secondary | ICD-10-CM

## 2020-12-23 NOTE — Patient Instructions (Signed)
Medication Instructions:  INCREASE- Lasix 80 mg by mouth daily for 2 days then back to 40 mg daily  *If you need a refill on your cardiac medications before your next appointment, please call your pharmacy*   Lab Work: None Ordered   Testing/Procedures: Your physician has requested that you have an echocardiogram. Echocardiography is a painless test that uses sound waves to create images of your heart. It provides your doctor with information about the size and shape of your heart and how well your heart's chambers and valves are working. This procedure takes approximately one hour. There are no restrictions for this procedure.   Follow-Up: At [facility] you and your health needs are our priority.  As part of our continuing mission to provide you with exceptional heart care, we have created designated Provider Care Teams.  These Care Teams include your primary Cardiologist (physician) and Advanced Practice Providers (APPs -  Physician Assistants and Nurse Practitioners) who all work together to provide you with the care you need, when you need it.  We recommend signing up for the patient portal called "MyChart".  Sign up information is provided on this After Visit Summary.  MyChart is used to connect with patients for Virtual Visits (Telemedicine).  Patients are able to view lab/test results, encounter notes, upcoming appointments, etc.  Non-urgent messages can be sent to your provider as well.   To learn more about what you can do with MyChart, go to [email or web address].    Your next appointment:   3-4 week(s)  The format for your next appointment:   In Person  Provider:   You will see one of the following Advanced Practice Providers on your designated Care Team:    Kerin Ransom, [place]

## 2020-12-23 NOTE — Assessment & Plan Note (Signed)
Compliant 

## 2020-12-23 NOTE — Assessment & Plan Note (Signed)
Felt to be secondary to AF.  He has been a BiV responder-EF 2015 60-65%

## 2020-12-23 NOTE — Assessment & Plan Note (Deleted)
He received a CRT-D device in December 3612 (Medtronic Raymond, atrial lead Medtronic 5076, ICD lead Medtronic [phone] Sprint Quattro secure, LV lead Medtronic 4194 Attain bipolar). He underwent a generator change out in March 2012 for a Medtronic protecta [id number] device. The patient's notes describe him as having "excellent RV pacing threshold and progressive exit block in the left ventricular lead"

## 2020-12-23 NOTE — Assessment & Plan Note (Signed)
BMI 41-weight up 17 lbs since Sept 2021

## 2020-12-23 NOTE — Assessment & Plan Note (Signed)
CHADS VASc= 4 for age, HTN, CHF, vascular dis.  He is on Pradaxa

## 2020-12-23 NOTE — Assessment & Plan Note (Signed)
Increased weight concerning for acute on chronic CHF.  Emphysema on CT, current smoking, and rhonchi on exam concerning for COPD.  Increase Lasix to 80 mg a day for 2 days, check echo for LV function.

## 2020-12-23 NOTE — Assessment & Plan Note (Signed)
MDT-implanted in '07.  Gen change 2012, 2016

## 2020-12-23 NOTE — Assessment & Plan Note (Signed)
Noted on CT scan May 2021- h/o smoking

## 2020-12-23 NOTE — Progress Notes (Signed)
Cardiology Office Note:    Date:  12/23/2020   ID:  Brian Craig, DOB Apr 27, 1951, MRN [id number]  PCP:  Lorrene Reid, PA-C  Cardiologist:  No primary care provider on file.  Electrophysiologist:  None   Referring MD: Lorrene Reid, PA-C   CC: SOB, edema  History of Present Illness:    Brian Craig is a 70 y.o. male with a hx of a severe dilated cardiomyopathy related to uncontrolled atrial fibrillation and thyrotoxicosis.  His ejection fraction was initially 17%.  He failed cardioversion and eventually received a biventricular pacemaker in 2007. In 2009 he had AVN ablation.  This was in [place].  He had a generator change with a Medtronic device in 2007, 2012, and 2016.  He was a BiV responder, his ejection fraction in August 2015 was 60 to 65%.  Other medical issues include morbid obesity, sleep apnea on BiPAP, and hypertension.  He is in the office today for a 1 year follow-up.  Since we saw him last he has been stable.  He is followed at [facility] for his thyroid test and these have been within normal limits.  He is a smoker and had a screening CT scan in May 2021 that showed emphysema.  He also had an incidental finding of atherosclerosis in his LAD.  Since we saw him last he has noted some increasing shortness of breath.  He has had intermittent lower extremity edema although today not so much.  He does not limit his sodium intake.  He has not had orthopnea.  He is compliant with his BiPAP.  His weight is up 17 pounds since September.  Past Medical History:  Diagnosis Date  . Atrial fibrillation ([place])   . CHF (congestive heart failure) ([place])   . Graves disease    Graves  . Hyperlipidemia   . Hypertension   . OSA treated with BiPAP   . Substance abuse ([place])    alcohol    Past Surgical History:  Procedure Laterality Date  . APPENDECTOMY    . BIV ICD GENERTAOR CHANGE OUT Left 2012   Medtronic Protecta  . CARDIAC ELECTROPHYSIOLOGY STUDY AND ABLATION    . EP IMPLANTABLE  DEVICE N/A 05/06/2015   Procedure: ICD Generator Changeout;  Surgeon: Sanda Klein, MD;  Location: [place] [facility];  Service: Cardiovascular;  Laterality: N/A;  . HERNIA REPAIR    . MENISCUS REPAIR Right   . VASECTOMY     unilateral due to injury    Current Medications: Current Meds  Medication Sig  . acetaminophen (TYLENOL) 325 MG tablet Take 650 mg by mouth every 6 (six) hours as needed for headache.  . ALPRAZolam (XANAX) 0.5 MG tablet Take 1 tablet (0.5 mg total) by mouth 3 (three) times daily as needed for anxiety.  . carvedilol (COREG CR) 80 MG 24 hr capsule TAKE 1 CAPSULE DAILY  . Cholecalciferol 50 MCG (2000 UT) CAPS Take 2,000 Units by mouth daily.  . furosemide (LASIX) 40 MG tablet Take 40 mg by mouth daily.  Marland Kitchen levothyroxine (SYNTHROID, LEVOTHROID) 200 MCG tablet Take 200 mcg by mouth daily before breakfast.  . lisinopril (ZESTRIL) 10 MG tablet TAKE 1 TABLET TWICE A DAY  . Multiple Vitamin (MULTIVITAMIN) tablet Take 1 tablet by mouth daily.  . potassium chloride (KLOR-CON) 10 MEQ tablet Take 10 mEq by mouth daily.  Marland Kitchen PRADAXA 150 MG CAPS capsule TAKE 1 CAPSULE TWICE DAILY  . sertraline (ZOLOFT) 50 MG tablet Take 1 tablet (50 mg total) by mouth  daily. **NEEDS APT FOR REFILLS**  . simvastatin (ZOCOR) 40 MG tablet TAKE 1 TABLET DAILY     Allergies:   Patient has no known allergies.   Social History   Socioeconomic History  . Marital status: Married    Spouse name: Not on file  . Number of children: Not on file  . Years of education: Not on file  . Highest education level: Not on file  Occupational History  . Not on file  Tobacco Use  . Smoking status: Current Every Day Smoker    Packs/day: 0.50    Years: 51.00    Pack years: 25.50    Types: Cigarettes  . Smokeless tobacco: Never Used  Substance and Sexual Activity  . Alcohol use: No    Comment: recovering alcoholic  . Drug use: No  . Sexual activity: Yes    Birth control/protection: None  Other Topics  Concern  . Not on file  Social History Narrative  . Not on file   Social Determinants of Health   Financial Resource Strain: Not on file  Food Insecurity: Not on file  Transportation Needs: Not on file  Physical Activity: Not on file  Stress: Not on file  Social Connections: Not on file     Family History: The patient's family history includes Alcohol abuse in his maternal grandfather and maternal uncle; Heart attack in his father; Heart disease in his father, mother, paternal aunt, and paternal uncle; Hyperlipidemia in his father; Hypertension in his father and mother.  ROS:   Please see the history of present illness.     All other systems reviewed and are negative.  EKGs/Labs/Other Studies Reviewed:    The following studies were reviewed today: Echo Aug 2015- Study Conclusions   - Left ventricle: The cavity size was normal. There was mild  concentric hypertrophy. Systolic function was normal. The  estimated ejection fraction was in the range of 60% to 65%. Wall  motion was normal; there were no regional wall motion  abnormalities.  - Aortic valve: Trileaflet; normal thickness leaflets. There was no  regurgitation.  - Mitral valve: There was no regurgitation.  - Left atrium: The atrium was severely dilated.  - Right ventricle: Systolic function was normal.  - Right atrium: The atrium was mildly dilated.  - Tricuspid valve: There was mild regurgitation.  - Pulmonary arteries: Systolic pressure was within the normal  range.  - Pericardium, extracardiac: There was no pericardial effusion.   Impressions:   - Normal biventricular size and systolic function.  Severe left atrial dilatation.  Mild tricuspid regurgitation.  CT scan-04/29/2020-  FINDINGS: Cardiovascular: The heart is normal in size. No pericardial effusion.  No evidence of thoracic aortic aneurysm. Mild atherosclerotic calcifications of the aortic arch.  Coronary atherosclerosis of  the LAD.  Left subclavian pacemaker.  Mediastinum/Nodes: No suspicious mediastinal lymphadenopathy.  Lungs/Pleura: Mild centrilobular and paraseptal emphysematous changes, upper lung predominant.  Mild ground-glass opacity/mosaic attenuation in the lungs bilaterally.  2.6 mm left lower lobe nodule, unchanged.  No pleural effusion or pneumothorax.  Upper Abdomen: Visualized upper abdomen is notable for cholelithiasis and mild vascular calcifications.  Musculoskeletal: Degenerative changes of the visualized thoracolumbar spine.  IMPRESSION: Lung-RADS 2, benign appearance or behavior. Continue annual screening with low-dose chest CT without contrast in 12 months.  Aortic Atherosclerosis (ICD10-I70.0) and Emphysema (ICD10-J43.9).   EKG:  EKG is ordered today.  The ekg ordered today demonstrates V paced  Recent Labs: 03/17/2020: ALT 14; BUN 11; Creatinine, Ser 1.08; Hemoglobin  15.3; Platelets 238; Potassium 4.3; Sodium 143; TSH 2.020  Recent Lipid Panel    Component Value Date/Time   CHOL 110 03/17/2020 0842   TRIG 130 03/17/2020 0842   HDL 34 (L) 03/17/2020 0842   CHOLHDL 3.2 03/17/2020 0842   CHOLHDL 3.6 02/09/2017 1103   VLDL 25 02/09/2017 1103   LDLCALC 53 03/17/2020 0842    Physical Exam:    VS:  BP 112/78   Pulse 79   Ht 6\' 1"  (1.854 m)   Wt (!) 332 lb 9.6 oz (150.9 kg)   BMI 43.88 kg/m     Wt Readings from Last 3 Encounters:  12/23/20 (!) 332 lb 9.6 oz (150.9 kg)  08/19/20 (!) 315 lb (142.9 kg)  04/14/20 (!) 311 lb (141.1 kg)     GEN:  Morbidly obese male,  in no acute distress HEENT: Normal NECK: No JVD; No carotid bruits CARDIAC: RRR, no murmurs, rubs, gallops RESPIRATORY:  Scattered rhonchi  ABDOMEN: Obese, soft, non-distended MUSCULOSKELETAL:  Trace edema; No deformity  SKIN: Warm and dry NEUROLOGIC:  Alert and oriented x 3 PSYCHIATRIC:  Normal affect   ASSESSMENT:    Dyspnea Increased weight concerning for acute on chronic CHF.   Emphysema on CT, current smoking, and rhonchi on exam concerning for COPD.  Increase Lasix to 80 mg a day for 2 days, check echo for LV function.  Congestive dilated cardiomyopathy ([place]) Felt to be secondary to AF.  He has been a BiV responder-EF 2015 60-65%  Biventricular automatic implantable cardioverter defibrillator in situ MDT-implanted in '07.  Gen change 2012, 2016  BMI 40.0-44.9, adult (HCC) BMI 41-weight up 17 lbs since Sept 2021  OSA on CPAP Compliant  Essential hypertension Controlled  Long term current use of anticoagulant CHADS VASc= 4 for age, HTN, CHF, vascular dis.  He is on Pradaxa  CAD (coronary artery disease) Incidental LAD atherosclerosis noted on lung cancer screen CT May 2021  Emphysema lung [facility]) Noted on CT scan May 2021- h/o smoking  PLAN:    Increase Lasix to 80 mg daily x 2 days- then resume 40 mg daily.  Check echo.  OV 2-3 weeks.   Medication Adjustments/Labs and Tests Ordered: Current medicines are reviewed at length with the patient today.  Concerns regarding medicines are outlined above.  Orders Placed This Encounter  Procedures  . EKG 12-Lead  . ECHOCARDIOGRAM COMPLETE   No orders of the defined types were placed in this encounter.   Patient Instructions  Medication Instructions:  INCREASE- Lasix 80 mg by mouth daily for 2 days then back to 40 mg daily  *If you need a refill on your cardiac medications before your next appointment, please call your pharmacy*   Lab Work: None Ordered   Testing/Procedures: Your physician has requested that you have an echocardiogram. Echocardiography is a painless test that uses sound waves to create images of your heart. It provides your doctor with information about the size and shape of your heart and how well your heart's chambers and valves are working. This procedure takes approximately one hour. There are no restrictions for this procedure.   Follow-Up: At [facility], you and your  health needs are our priority.  As part of our continuing mission to provide you with exceptional heart care, we have created designated Provider Care Teams.  These Care Teams include your primary Cardiologist (physician) and Advanced Practice Providers (APPs -  Physician Assistants and Nurse Practitioners) who all work together to provide you with the  care you need, when you need it.  We recommend signing up for the patient portal called "MyChart".  Sign up information is provided on this After Visit Summary.  MyChart is used to connect with patients for Virtual Visits (Telemedicine).  Patients are able to view lab/test results, encounter notes, upcoming appointments, etc.  Non-urgent messages can be sent to your provider as well.   To learn more about what you can do with MyChart, go to [email or web address].    Your next appointment:   3-4 week(s)  The format for your next appointment:   In Person  Provider:   You will see one of the following Advanced Practice Providers on your designated Care Team:    Kerin Ransom, PA-C          Signed, Kerin Ransom, [place]  12/23/2020 10:32 AM    [place]

## 2020-12-23 NOTE — Assessment & Plan Note (Signed)
Controlled.  

## 2020-12-23 NOTE — Assessment & Plan Note (Signed)
Incidental LAD atherosclerosis noted on lung cancer screen CT May 2021

## 2020-12-24 LAB — CUP PACEART REMOTE DEVICE CHECK
Battery Remaining Longevity: 18 mo
Battery Voltage: 2.92 V
Brady Statistic AP VP Percent: 0 %
Brady Statistic AP VS Percent: 0 %
Brady Statistic AS VP Percent: 99.66 %
Brady Statistic AS VS Percent: 0.34 %
Brady Statistic RA Percent Paced: 0 %
Brady Statistic RV Percent Paced: 99.27 %
Date Time Interrogation Session: 20220111043723
HighPow Impedance: 41 Ohm
HighPow Impedance: 58 Ohm
Implantable Lead Implant Date: 20071217
Implantable Lead Implant Date: 20071217
Implantable Lead Implant Date: 20071217
Implantable Lead Location: 753858
Implantable Lead Location: 753859
Implantable Lead Location: 753860
Implantable Lead Model: 4194
Implantable Lead Model: 5076
Implantable Lead Model: 6947
Implantable Pulse Generator Implant Date: 20160524
Lead Channel Impedance Value: 228 Ohm
Lead Channel Impedance Value: 342 Ohm
Lead Channel Impedance Value: 342 Ohm
Lead Channel Impedance Value: 361 Ohm
Lead Channel Impedance Value: 513 Ohm
Lead Channel Impedance Value: 608 Ohm
Lead Channel Pacing Threshold Amplitude: 0.75 V
Lead Channel Pacing Threshold Amplitude: 0.875 V
Lead Channel Pacing Threshold Pulse Width: 0.4 ms
Lead Channel Pacing Threshold Pulse Width: 0.8 ms
Lead Channel Sensing Intrinsic Amplitude: 4.375 mV
Lead Channel Sensing Intrinsic Amplitude: 4.375 mV
Lead Channel Setting Pacing Amplitude: 1.75 V
Lead Channel Setting Pacing Amplitude: 2.5 V
Lead Channel Setting Pacing Pulse Width: 0.4 ms
Lead Channel Setting Pacing Pulse Width: 0.8 ms
Lead Channel Setting Sensing Sensitivity: 0.3 mV

## 2020-12-25 ENCOUNTER — Other Ambulatory Visit: Payer: Self-pay | Admitting: Physician Assistant

## 2021-01-05 ENCOUNTER — Other Ambulatory Visit: Payer: Self-pay | Admitting: Cardiovascular Disease

## 2021-01-08 NOTE — Progress Notes (Signed)
Remote ICD transmission.   

## 2021-01-12 ENCOUNTER — Encounter: Payer: Self-pay | Admitting: Physician Assistant

## 2021-01-12 ENCOUNTER — Ambulatory Visit (INDEPENDENT_AMBULATORY_CARE_PROVIDER_SITE_OTHER): Payer: Medicare Other | Admitting: Physician Assistant

## 2021-01-12 ENCOUNTER — Other Ambulatory Visit: Payer: Self-pay

## 2021-01-12 VITALS — BP 129/82 | HR 78 | Temp 97.8°F | Ht 73.0 in | Wt 328.0 lb

## 2021-01-12 DIAGNOSIS — Z716 Tobacco abuse counseling: Secondary | ICD-10-CM | POA: Diagnosis not present

## 2021-01-12 DIAGNOSIS — E890 Postprocedural hypothyroidism: Secondary | ICD-10-CM

## 2021-01-12 DIAGNOSIS — E039 Hypothyroidism, unspecified: Secondary | ICD-10-CM | POA: Diagnosis not present

## 2021-01-12 DIAGNOSIS — F419 Anxiety disorder, unspecified: Secondary | ICD-10-CM

## 2021-01-12 DIAGNOSIS — I10 Essential (primary) hypertension: Secondary | ICD-10-CM | POA: Diagnosis not present

## 2021-01-12 DIAGNOSIS — Z6841 Body Mass Index (BMI) 40.0 and over, adult: Secondary | ICD-10-CM | POA: Diagnosis not present

## 2021-01-12 DIAGNOSIS — D490 Neoplasm of unspecified behavior of digestive system: Secondary | ICD-10-CM | POA: Diagnosis not present

## 2021-01-12 MED ORDER — ALPRAZOLAM 0.5 MG PO TABS: 0.5000 mg | ORAL_TABLET | Freq: Three times a day (TID) | ORAL | 0 refills | Status: DC | PRN

## 2021-01-12 NOTE — Progress Notes (Signed)
Telehealth office visit note for Brian Reid, PA-C- at Primary Care at [facility]   I connected with current patient today by telephone and verified that I am speaking with the correct person   . Location of the patient: Home . Location of the provider: Office - This visit type was conducted due to national recommendations for restrictions regarding the COVID-19 Pandemic (e.g. social distancing) in an effort to limit this patient's exposure and mitigate transmission in our community.    - No physical exam could be performed with this format, beyond that communicated to [place] by the patient/ family members as noted.   - Additionally my office staff/ schedulers were to discuss with the patient that there may be a monetary charge related to this service, depending on their medical insurance.  My understanding is that patient understood and consented to proceed.     _________________________________________________________________________________   History of Present Illness: Patient calls in to follow-up on mood, hypertension and hypothyroid. Has no acute concerns.  HTN: Pt denies new onset chest pain, palpitations, dizziness or headache.  Patient recently seen by cardiology and Lasix was increased to 80 mg for a few days.  Patient reports shortness of breath and lower extremity swelling have improved. Taking medication as directed without side effects. Checks BP at home and readings range 110-120s/80s. Pt follows a low salt diet.  Mood: Taking medications without issue. Has no concerns. States mood is stable. Denies SI/HI.  Hypothyroid: Continues to see Dr. Posey Pronto (endocrinologist). Reports had Grave's disease and took nuclear pill which lead to underactive thyroid.  Tobacco use: Patient has reduced smoking use to 1/2 PPD.     No flowsheet data found.  Depression screen [facility] 2/9 01/12/2021 01/12/2021 04/14/2020 09/24/2019 04/09/2019  Decreased Interest 0 0 0 0 0  Down, Depressed,  Hopeless 0 0 0 0 0  PHQ - 2 Score 0 0 0 0 0  Altered sleeping 0 0 0 0 0  Tired, decreased energy 0 0 0 0 0  Change in appetite 0 0 0 0 0  Feeling bad or failure about yourself  0 - 0 0 0  Trouble concentrating 0 - 0 0 0  Moving slowly or fidgety/restless 0 - 0 0 0  Suicidal thoughts 0 - 0 0 0  PHQ-9 Score 0 0 0 0 0  Some recent data might be hidden      Impression and Recommendations:     1. Anxiety   2. Hypertension, unspecified type   3. Hypothyroidism, postradioiodine therapy   4. Encounter for tobacco use cessation counseling     Anxiety: -PHQ-9 score of 0. Controlled. -Continue current medication regimen.  Not due for refill of sertraline yet.  PDMP reviewed, Xanax last filled June 02, 2020.  Provided refill. -Will continue to monitor.  Hypertension, unspecified type: -Controlled. -Continue current medication regimen. -Follow low-sodium diet and stay well-hydrated. -Will continue to monitor alongside cardiology and repeat CMP with [place].  Hypothyroidism, post radioiodine therapy: -Followed by endocrinology, Dr. Posey Pronto with [facility].  Last TSH 2.020 -Asymptomatic. -On levothyroxine 200 mcg.  Encounter for tobacco use cessation counseling: Smoking cessation instruction/counseling given:  counseled patient on the dangers of tobacco use, advised patient to stop smoking, and reviewed strategies to maximize success   - As part of my medical decision making, I reviewed the following data within the electronic MEDICAL RECORD NUMBER History obtained from pt /family, CMA notes reviewed and incorporated if applicable, Labs reviewed, Radiograph/  tests reviewed if applicable and OV notes from prior OV's with me, as well as any other specialists she/he has seen since seeing me last, were all reviewed and used in my medical decision making process today.    - Additionally, when appropriate, discussion had with patient regarding our treatment plan, and their biases/concerns about that  plan were used in my medical decision making today.    - The patient agreed with the plan and demonstrated an understanding of the instructions.   No barriers to understanding were identified.     - The patient was advised to call back or seek an in-person evaluation if the symptoms worsen or if the condition fails to improve as anticipated.   Return in about 4 months (around 05/12/2021) for MCW and FBW 1 wk prior .    No orders of the defined types were placed in this encounter.   Meds ordered this encounter  Medications  . ALPRAZolam (XANAX) 0.5 MG tablet    Sig: Take 1 tablet (0.5 mg total) by mouth 3 (three) times daily as needed for anxiety.    Dispense:  30 tablet    Refill:  0    Order Specific Question:   Supervising Provider    Answer:   Beatrice Lecher D [[id number]]    Medications Discontinued During This Encounter  Medication Reason  . ALPRAZolam (XANAX) 0.5 MG tablet Reorder       Time spent on visit including pre-visit chart review and post-visit care was 15 minutes.      The [place] was signed into law in 2016 which includes the topic of electronic health records.  This provides immediate access to information in MyChart.  This includes consultation notes, operative notes, office notes, lab results and pathology reports.  If you have any questions about what you read please let us know at your next visit or call us at the office.  We are right here with you.  Note:  This note was prepared with assistance of Dragon voice recognition software. Occasional wrong-word or sound-a-like substitutions may have occurred due to the inherent limitations of voice recognition software.   __________________________________________________________________________________     Patient Care Team    Relationship Specialty Notifications Start End  Brian Craig, [place] PCP - General Physician Assistant  05/14/20   Brendolyn Patty, MD  Dermatology  02/21/17   Warden Fillers, MD Consulting Physician Ophthalmology  02/21/17   Amalia Greenhouse, MD Referring Physician Endocrinology  02/21/17   Sanda Klein, MD Consulting Physician Cardiology  02/21/17      -Vitals obtained; medications/ allergies reconciled;  personal medical, social, Sx etc.histories were updated by CMA, reviewed by me and are reflected in chart   Patient Active Problem List   Diagnosis Date Noted  . CAD (coronary artery disease) 12/23/2020  . Dyspnea 12/23/2020  . Emphysema lung ([place]) 12/23/2020  . Spondylolysis, lumbar region 08/19/2020  . Spondylolisthesis, lumbar region 08/19/2020  . Encounter for Medicare annual wellness exam 04/09/2019  . Otitis media 01/10/2019  . Biventricular automatic implantable cardioverter defibrillator in situ 09/05/2018  . Postablative hypothyroidism 09/05/2018  . BMI 40.0-44.9, adult ([place]) 08/28/2018  . Acute bronchitis 04/25/2018  . Cough in adult 04/25/2018  . Dyslipidemia 02/22/2018  . Testicular swelling, left 10/10/2017  . Essential hypertension 08/25/2017  . Anxiety 08/25/2017  . Healthcare maintenance 08/25/2017  . 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 Localized swelling, mass or lump of neck 06/01/2017  . Vitamin D deficiency 03/04/2017  .  Long term current use of anticoagulant 09/20/2016  . Chronic diastolic heart failure ([place]) 02/02/2016  . History of alcohol dependence ([place]) 02/02/2016  . ICD (implantable cardioverter-defibrillator) battery depletion 04/17/2015  . Congestive dilated cardiomyopathy ([place]) 07/15/2014  . Chronic combined systolic and diastolic CHF, NYHA class 1 ([place]) 07/15/2014  . OSA on CPAP 07/15/2014  . Permanent atrial fibrillation ([place]) 07/15/2014  . CHB (complete heart block) ([place]) 07/15/2014  . Biventricular ICD  implanted 2007, generator change in 2012 07/15/2014  . Tobacco abuse 07/15/2014  . Alcoholism in recovery ([place]) 07/15/2014  . Hypothyroidism, postradioiodine therapy 07/15/2014  . Dyslipidemia  (high LDL; low HDL) 07/15/2014     Current Meds  Medication Sig  . acetaminophen (TYLENOL) 325 MG tablet Take 650 mg by mouth every 6 (six) hours as needed for headache.  . carvedilol (COREG CR) 80 MG 24 hr capsule TAKE 1 CAPSULE DAILY  . Cholecalciferol 50 MCG (2000 UT) CAPS Take 2,000 Units by mouth daily.  . furosemide (LASIX) 40 MG tablet Take 40 mg by mouth daily.  Marland Kitchen levothyroxine (SYNTHROID, LEVOTHROID) 200 MCG tablet Take 200 mcg by mouth daily before breakfast.  . lisinopril (ZESTRIL) 10 MG tablet TAKE 1 TABLET TWICE A DAY  . Multiple Vitamin (MULTIVITAMIN) tablet Take 1 tablet by mouth daily.  . potassium chloride (KLOR-CON) 10 MEQ tablet Take 10 mEq by mouth daily.  Marland Kitchen PRADAXA 150 MG CAPS capsule TAKE 1 CAPSULE TWICE DAILY  . sertraline (ZOLOFT) 50 MG tablet TAKE 1 TABLET DAILY (NEED APPOINTMENT FOR REFILLS)  . simvastatin (ZOCOR) 40 MG tablet TAKE 1 TABLET DAILY  . [DISCONTINUED] ALPRAZolam (XANAX) 0.5 MG tablet Take 1 tablet (0.5 mg total) by mouth 3 (three) times daily as needed for anxiety.     Allergies:  No Known Allergies   ROS:  See above HPI for pertinent positives and negatives   Objective:   Blood pressure 129/82, pulse 78, temperature 97.8 F (36.6 C), height 6\' 1"  (1.854 m), weight (!) 328 lb (148.8 kg).  (if some vitals are omitted, this means that patient was UNABLE to obtain them. ) General: A & O * 3; sounds in no acute distress Respiratory: speaking in full sentences, no conversational dyspnea Psych: insight appears good, mood- appears full

## 2021-01-12 NOTE — Patient Instructions (Signed)

## 2021-01-15 ENCOUNTER — Telehealth: Payer: Self-pay | Admitting: Cardiovascular Disease

## 2021-01-15 NOTE — Telephone Encounter (Signed)
Pt called in and stated he filed a AVA with [employer] ( his employer) to continue working from home due to health reasons.  He stated they would be faxed over something to the office for Dr Sallyanne Kuster to fill out.  He also wanted to to know about see if Dr Sallyanne Kuster could also include info for him to work a only 6 hr a day work shift.    Best number [phone]

## 2021-01-15 NOTE — Telephone Encounter (Signed)
Spoke with the patient. He stated that he is having forms sent to the office for Dr. Sallyanne Kuster to sign off on. He is requesting he continue to work from home at a 6 hour work day. He stated that when he works 8 hours that his ankle swell. He has been advised that Dr. Sallyanne Kuster will review the forms when they come in.

## 2021-01-19 ENCOUNTER — Other Ambulatory Visit: Payer: Self-pay

## 2021-01-19 ENCOUNTER — Ambulatory Visit (HOSPITAL_COMMUNITY): Payer: Medicare Other | Attending: Cardiovascular Disease

## 2021-01-19 DIAGNOSIS — R0602 Shortness of breath: Secondary | ICD-10-CM | POA: Insufficient documentation

## 2021-01-19 LAB — ECHOCARDIOGRAM COMPLETE
Area-P 1/2: 5.09 cm2
P 1/2 time: 1132 msec
S' Lateral: 3.1 cm

## 2021-01-20 DIAGNOSIS — K118 Other diseases of salivary glands: Secondary | ICD-10-CM | POA: Diagnosis not present

## 2021-01-21 ENCOUNTER — Telehealth: Payer: Self-pay | Admitting: Cardiovascular Disease

## 2021-01-21 NOTE — Telephone Encounter (Signed)
[facility] received paperwork from [facility] on 2/9, paperwork was given to provider and nurse.

## 2021-01-27 ENCOUNTER — Telehealth: Payer: Self-pay | Admitting: Cardiovascular Disease

## 2021-01-27 NOTE — Telephone Encounter (Signed)
I s/w Vickie with Dr. Servando Salina office for clarification on procedure being done.  PROCEDURE IS EXCISION OF PAROTID MASS ANESTHESIA WILL BE GENERAL DATE OF PROCEDURE IS 02/19/21  Pt has appt with Kerin Ransom, [facility] 02/02/21. Per pre op provider pt to be assessed for clearance at that time. I will forward these notes to [facility] for upcoming appt. Will remove from the pre op call back pool.

## 2021-01-27 NOTE — Telephone Encounter (Signed)
Vickie is calling to give the date of Brian Craig's procedure. She states it is scheduled for 02/19/21 and will be done at [facility], but is still unsure of the time. Vickie states the clearance has already been received from Dr. Sallyanne Kuster, but I was unable to find the clearance in the system to add on to the telephone note. Please advise.

## 2021-01-27 NOTE — Telephone Encounter (Addendum)
Not clear where surgical clearance request is coming from.  I do not see a formal request in chart.  I reviewed Care Everywhere. He has seen Dr. Nicolette Bang at [facility] on 01/20/21 for a parotid mass.  The notes indicate he is to have it excised.  Suspect this may be the surgery in question.  We need to obtain formal request for surgical clearance to make sure we are evaluating him for the correct procedure.  Note indicates "Vickie is calling."  It looks like this may be someone from surgeon's office.  It is probably good to start with her to find out where request is coming from.  He has a f/u with Kerin Ransom, PA-C Mon 02/02/21.  Surgical clearance can be provided at that visit.  I will fwd to [facility] as FYI.  Once formal clearance request obtained, please forward note to Lurena Joiner so he has all needed info for appt on 2/21.  Richardson Dopp, PA-C    01/27/2021 3:17 PM

## 2021-01-28 NOTE — Telephone Encounter (Signed)
Brian Craig You see 2/21. Please send surgical clearance info to surgeon. Thanks [employer]

## 2021-01-30 DIAGNOSIS — Z0279 Encounter for issue of other medical certificate: Secondary | ICD-10-CM

## 2021-02-02 ENCOUNTER — Encounter: Payer: Self-pay | Admitting: Cardiology

## 2021-02-02 ENCOUNTER — Ambulatory Visit (INDEPENDENT_AMBULATORY_CARE_PROVIDER_SITE_OTHER): Payer: Medicare Other | Admitting: Cardiology

## 2021-02-02 ENCOUNTER — Other Ambulatory Visit: Payer: Self-pay

## 2021-02-02 VITALS — BP 120/76 | HR 92 | Ht 73.0 in | Wt 330.6 lb

## 2021-02-02 DIAGNOSIS — Z01818 Encounter for other preprocedural examination: Secondary | ICD-10-CM | POA: Insufficient documentation

## 2021-02-02 DIAGNOSIS — Z95810 Presence of automatic (implantable) cardiac defibrillator: Secondary | ICD-10-CM | POA: Diagnosis not present

## 2021-02-02 DIAGNOSIS — Z7901 Long term (current) use of anticoagulants: Secondary | ICD-10-CM | POA: Diagnosis not present

## 2021-02-02 DIAGNOSIS — J439 Emphysema, unspecified: Secondary | ICD-10-CM | POA: Diagnosis not present

## 2021-02-02 DIAGNOSIS — I420 Dilated cardiomyopathy: Secondary | ICD-10-CM

## 2021-02-02 DIAGNOSIS — D490 Neoplasm of unspecified behavior of digestive system: Secondary | ICD-10-CM | POA: Diagnosis not present

## 2021-02-02 DIAGNOSIS — Z6841 Body Mass Index (BMI) 40.0 and over, adult: Secondary | ICD-10-CM

## 2021-02-02 DIAGNOSIS — Z79899 Other long term (current) drug therapy: Secondary | ICD-10-CM

## 2021-02-02 DIAGNOSIS — I2510 Atherosclerotic heart disease of native coronary artery without angina pectoris: Secondary | ICD-10-CM | POA: Diagnosis not present

## 2021-02-02 DIAGNOSIS — Z9989 Dependence on other enabling machines and devices: Secondary | ICD-10-CM

## 2021-02-02 DIAGNOSIS — R0602 Shortness of breath: Secondary | ICD-10-CM | POA: Diagnosis not present

## 2021-02-02 DIAGNOSIS — G4733 Obstructive sleep apnea (adult) (pediatric): Secondary | ICD-10-CM | POA: Diagnosis not present

## 2021-02-02 NOTE — Telephone Encounter (Signed)
I saw the patient today and cleared him but I don't see any official clearance-fax number to send the clearance to.  Kerin Ransom PA-C 02/02/2021 2:08 PM

## 2021-02-02 NOTE — Patient Instructions (Signed)
Medication Instructions:  HOLD Pradaxa 2 days prior to surgery  *If you need a refill on your cardiac medications before your next appointment, please call your pharmacy*   Lab Work: BMP and BNP   Testing/Procedures: None Ordered   Follow-Up: At [facility], you and your health needs are our priority.  As part of our continuing mission to provide you with exceptional heart care, we have created designated Provider Care Teams.  These Care Teams include your primary Cardiologist (physician) and Advanced Practice Providers (APPs -  Physician Assistants and Nurse Practitioners) who all work together to provide you with the care you need, when you need it.  We recommend signing up for the patient portal called "MyChart".  Sign up information is provided on this After Visit Summary.  MyChart is used to connect with patients for Virtual Visits (Telemedicine).  Patients are able to view lab/test results, encounter notes, upcoming appointments, etc.  Non-urgent messages can be sent to your provider as well.   To learn more about what you can do with MyChart, go to [email or web address].    Your next appointment:   6 month(s)  The format for your next appointment:   In Person  Provider:   You may see Sanda Klein, MD or one of the following Advanced Practice Providers on your designated Care Team:    Almyra Deforest, PA-C  Fabian Sharp, PA-C or   Roby Lofts, [place]

## 2021-02-02 NOTE — Progress Notes (Signed)
Cardiology Office Note:    Date:  02/02/2021   ID:  Brian Craig, DOB 1951/06/10, MRN [id number]  PCP:  Lorrene Reid, PA-C  Cardiologist:  Dr Sallyanne Kuster Electrophysiologist:  None   Referring MD: Lorrene Reid, PA-C   No chief complaint on file.   History of Present Illness:    Brian Craig is a 70 y.o. male with a hx of past severe dilated cardiomyopathy related to uncontrolled atrial fibrillation and thyrotoxicosis.  His ejection fraction was initially 17%.  He failed cardioversion and eventually received a biventricular pacemaker in 2007. In 2009 he had AVN ablation.  This was in [place].  He had a generator change with a Medtronic device in 2007, 2012, and 2016.  He is a BiV responder, his ejection fraction in August 2015 was 60 to 65%.  Other medical issues include morbid obesity, sleep apnea on BiPAP, and hypertension.  He was seen 12/23/2020 in the office today for a 1 year follow-up.  He is followed at [facility] for his thyroid test and these have been within normal limits.  He is a smoker and had a screening CT scan in May 2021 that showed emphysema.  He also had an incidental finding of atherosclerosis in his LAD.  Since we saw him last he had noted some increasing shortness of breath.  He also had intermittent lower extremity edema.  He does not limit his sodium intake.  He has not had orthopnea.  He is compliant with his BiPAP.  His weight was up 17 pounds since September. I increased his Lasix to 80 mg daily and ordered an echo.  He returns today for follow up.  He tells me he feels like the extra Lasix helped.  His echo showed normal LV function with a moderately dilated right atrium and normal RV function.  I asked planed to the patient that I suspect these changes were secondary to his COPD.  I explained the pathophysiology of right heart failure.  I encouraged him to weigh himself daily and if his weight goes up 3 to 5 pounds in a week he should take an extra 40 mg of Lasix  for few days until it comes back down.    He also tells me he needs parotid surgery.  This is be done March 10.  I reviewed this with the pharmacist.  He is on Pradaxa.  The patient is low risk for complications but high risk for bleeding and the recommendation is to hold the Pradaxa 3 days preop and resume as soon as safe postop.  The patient understands these instructions.  I also ordered a BM P and a BNP today for baseline.  Past Medical History:  Diagnosis Date  . Atrial fibrillation ([place])   . CHF (congestive heart failure) ([place])   . Graves disease    Graves  . Hyperlipidemia   . Hypertension   . OSA treated with BiPAP   . Substance abuse ([place])    alcohol    Past Surgical History:  Procedure Laterality Date  . APPENDECTOMY    . BIV ICD GENERTAOR CHANGE OUT Left 2012   Medtronic Protecta  . CARDIAC ELECTROPHYSIOLOGY STUDY AND ABLATION    . EP IMPLANTABLE DEVICE N/A 05/06/2015   Procedure: ICD Generator Changeout;  Surgeon: Sanda Klein, MD;  Location: [place] [facility];  Service: Cardiovascular;  Laterality: N/A;  . HERNIA REPAIR    . MENISCUS REPAIR Right   . VASECTOMY     unilateral due to injury  Current Medications: Current Meds  Medication Sig  . acetaminophen (TYLENOL) 325 MG tablet Take 650 mg by mouth every 6 (six) hours as needed for headache.  . ALPRAZolam (XANAX) 0.5 MG tablet Take 1 tablet (0.5 mg total) by mouth 3 (three) times daily as needed for anxiety.  . carvedilol (COREG CR) 80 MG 24 hr capsule TAKE 1 CAPSULE DAILY  . Cholecalciferol 50 MCG (2000 UT) CAPS Take 2,000 Units by mouth daily.  . furosemide (LASIX) 40 MG tablet Take 40 mg by mouth daily.  Marland Kitchen levothyroxine (SYNTHROID, LEVOTHROID) 200 MCG tablet Take 200 mcg by mouth daily before breakfast.  . lisinopril (ZESTRIL) 10 MG tablet TAKE 1 TABLET TWICE A DAY  . Multiple Vitamin (MULTIVITAMIN) tablet Take 1 tablet by mouth daily.  . potassium chloride (KLOR-CON) 10 MEQ tablet Take 10 mEq by mouth  daily.  Marland Kitchen PRADAXA 150 MG CAPS capsule TAKE 1 CAPSULE TWICE DAILY  . sertraline (ZOLOFT) 50 MG tablet TAKE 1 TABLET DAILY (NEED APPOINTMENT FOR REFILLS)  . simvastatin (ZOCOR) 40 MG tablet TAKE 1 TABLET DAILY     Allergies:   Patient has no known allergies.   Social History   Socioeconomic History  . Marital status: Married    Spouse name: Not on file  . Number of children: Not on file  . Years of education: Not on file  . Highest education level: Not on file  Occupational History  . Not on file  Tobacco Use  . Smoking status: Current Every Day Smoker    Packs/day: 0.50    Years: 51.00    Pack years: 25.50    Types: Cigarettes  . Smokeless tobacco: Never Used  Substance and Sexual Activity  . Alcohol use: No    Comment: recovering alcoholic  . Drug use: No  . Sexual activity: Yes    Birth control/protection: None  Other Topics Concern  . Not on file  Social History Narrative  . Not on file   Social Determinants of Health   Financial Resource Strain: Not on file  Food Insecurity: Not on file  Transportation Needs: Not on file  Physical Activity: Not on file  Stress: Not on file  Social Connections: Not on file     Family History: The patient's family history includes Alcohol abuse in his maternal grandfather and maternal uncle; Heart attack in his father; Heart disease in his father, mother, paternal aunt, and paternal uncle; Hyperlipidemia in his father; Hypertension in his father and mother.  ROS:   Please see the history of present illness.     All other systems reviewed and are negative.  EKGs/Labs/Other Studies Reviewed:    The following studies were reviewed today: Echo 01/19/2021- IMPRESSIONS    1. Left ventricular ejection fraction, by estimation, is 55 to 60%. The  left ventricle has normal function. The left ventricle has no regional  wall motion abnormalities. There is mild concentric left ventricular  hypertrophy. Left ventricular diastolic   function could not be evaluated.  2. Right ventricular systolic function is normal. The right ventricular  size is moderately enlarged. Tricuspid regurgitation signal is inadequate  for assessing PA pressure.  3. Left atrial size was moderately dilated.  4. Right atrial size was moderately dilated.  5. The mitral valve is grossly normal. No evidence of mitral valve  regurgitation. No evidence of mitral stenosis.  6. The aortic valve is tricuspid. Aortic valve regurgitation is mild. No  aortic stenosis is present.  7. The inferior vena cava  is normal in size with greater than 50%  respiratory variability, suggesting right atrial pressure of 3 mmHg.   EKG:  EKG is not ordered today.  The ekg ordered 12/23/2020 demonstrates Bi V paced  Recent Labs: 03/17/2020: ALT 14; BUN 11; Creatinine, Ser 1.08; Hemoglobin 15.3; Platelets 238; Potassium 4.3; Sodium 143; TSH 2.020  Recent Lipid Panel    Component Value Date/Time   CHOL 110 03/17/2020 0842   TRIG 130 03/17/2020 0842   HDL 34 (L) 03/17/2020 0842   CHOLHDL 3.2 03/17/2020 0842   CHOLHDL 3.6 02/09/2017 1103   VLDL 25 02/09/2017 1103   LDLCALC 53 03/17/2020 0842    Physical Exam:    VS:  BP 120/76   Pulse 92   Ht 6\' 1"  (1.854 m)   Wt (!) 330 lb 9.6 oz (150 kg)   SpO2 97%   BMI 43.62 kg/m     Wt Readings from Last 3 Encounters:  02/02/21 (!) 330 lb 9.6 oz (150 kg)  01/12/21 (!) 328 lb (148.8 kg)  12/23/20 (!) 332 lb 9.6 oz (150.9 kg)     GEN: Obese male well developed in no acute distress HEENT: Normal NECK: No JVD; No carotid bruits-mass rt neck CARDIAC: RRR, no murmurs, rubs, gallops RESPIRATORY:  Clear to auscultation without rales, wheezing or rhonchi  ABDOMEN: obese, soft, non-distended MUSCULOSKELETAL:  No edema; No deformity  SKIN: Warm and dry NEUROLOGIC:  Alert and oriented x 3 PSYCHIATRIC:  Normal affect   ASSESSMENT:    Pre op clearance Pt is an acceptable risk for parotid surgery from cardiac  standpoint without further work up.  OK to hold Pradaxa 3 days pre op and resume as soon as safe post op.   Dyspnea Improved after two days of increased lasix- I suspect he has some degree of rt heart failure secondary to COPD.  We discuss low sodium diet, daily weights, and PRN extra lasix use.   History of congestive dilated cardiomyopathy (HCC) Felt to be secondary to AF.  He has been a BiV responder-EF  55-60% by echo 01/19/2021  Biventricular automatic implantable cardioverter defibrillator in situ MDT-implanted in '07.  Gen change 2012, 2016  BMI 40.0-44.9, adult (HCC) BMI 41-weight up 17 lbs since Sept 2021  OSA on CPAP Compliant  Essential hypertension Controlled  Long term current use of anticoagulant CHADS VASc= 4 for age, HTN, CHF, vascular dis.  He is on Pradaxa  CAD (coronary artery disease) Incidental LAD atherosclerosis noted on lung cancer screen CT May 2021  Emphysema lung [facility]) Noted on CT scan May 2021- h/o smoking  PLAN:    As above- f/u Dr Sallyanne Kuster in 6 months   Medication Adjustments/Labs and Tests Ordered: Current medicines are reviewed at length with the patient today.  Concerns regarding medicines are outlined above.  No orders of the defined types were placed in this encounter.  No orders of the defined types were placed in this encounter.   There are no Patient Instructions on file for this visit.   Signed, Kerin Ransom, PA-C  02/02/2021 1:49 PM    [facility]

## 2021-02-03 LAB — BASIC METABOLIC PANEL
BUN/Creatinine Ratio: 11 (ref 10–24)
BUN: 12 mg/dL (ref 8–27)
CO2: 22 mmol/L (ref 20–29)
Calcium: 9.4 mg/dL (ref 8.6–10.2)
Chloride: 99 mmol/L (ref 96–106)
Creatinine, Ser: 1.14 mg/dL (ref 0.76–1.27)
GFR calc Af Amer: 75 mL/min/{1.73_m2} (ref >59)
GFR calc non Af Amer: 65 mL/min/{1.73_m2} (ref >59)
Glucose: 78 mg/dL (ref 65–99)
Potassium: 4.5 mmol/L (ref 3.5–5.2)
Sodium: 141 mmol/L (ref 134–144)

## 2021-02-03 LAB — BRAIN NATRIURETIC PEPTIDE: BNP: 108.4 pg/mL — ABNORMAL HIGH (ref 0.0–100.0)

## 2021-02-03 NOTE — Telephone Encounter (Signed)
ADDENDUM: The requesting office never sent over an official clearance request. I have called Dr. Servando Salina office this morning and confirmed ph# [phone]; fax # [phone]. I will fax ov notes giving clearance from Kerin Ransom, [facility] to Dr. Nicolette Bang.

## 2021-02-05 NOTE — Telephone Encounter (Signed)
Paperwork was faxed on 2/18 to [facility].

## 2021-02-06 ENCOUNTER — Other Ambulatory Visit: Payer: Self-pay | Admitting: Physician Assistant

## 2021-02-09 DIAGNOSIS — Z01811 Encounter for preprocedural respiratory examination: Secondary | ICD-10-CM | POA: Diagnosis not present

## 2021-02-09 DIAGNOSIS — J449 Chronic obstructive pulmonary disease, unspecified: Secondary | ICD-10-CM | POA: Diagnosis not present

## 2021-02-09 DIAGNOSIS — K056 Periodontal disease, unspecified: Secondary | ICD-10-CM | POA: Diagnosis not present

## 2021-02-12 DIAGNOSIS — J449 Chronic obstructive pulmonary disease, unspecified: Secondary | ICD-10-CM | POA: Diagnosis not present

## 2021-02-19 DIAGNOSIS — I2510 Atherosclerotic heart disease of native coronary artery without angina pectoris: Secondary | ICD-10-CM | POA: Diagnosis not present

## 2021-02-19 DIAGNOSIS — F17210 Nicotine dependence, cigarettes, uncomplicated: Secondary | ICD-10-CM | POA: Diagnosis not present

## 2021-02-19 DIAGNOSIS — I420 Dilated cardiomyopathy: Secondary | ICD-10-CM | POA: Diagnosis not present

## 2021-02-19 DIAGNOSIS — K118 Other diseases of salivary glands: Secondary | ICD-10-CM | POA: Diagnosis not present

## 2021-02-19 DIAGNOSIS — E039 Hypothyroidism, unspecified: Secondary | ICD-10-CM | POA: Diagnosis not present

## 2021-02-19 DIAGNOSIS — Z79899 Other long term (current) drug therapy: Secondary | ICD-10-CM | POA: Diagnosis not present

## 2021-02-19 DIAGNOSIS — I110 Hypertensive heart disease with heart failure: Secondary | ICD-10-CM | POA: Diagnosis not present

## 2021-02-19 DIAGNOSIS — D110 Benign neoplasm of parotid gland: Secondary | ICD-10-CM | POA: Diagnosis not present

## 2021-02-19 DIAGNOSIS — J439 Emphysema, unspecified: Secondary | ICD-10-CM | POA: Diagnosis not present

## 2021-02-19 DIAGNOSIS — Z7901 Long term (current) use of anticoagulants: Secondary | ICD-10-CM | POA: Diagnosis not present

## 2021-02-19 DIAGNOSIS — Z6841 Body Mass Index (BMI) 40.0 and over, adult: Secondary | ICD-10-CM | POA: Diagnosis not present

## 2021-02-19 DIAGNOSIS — F32A Depression, unspecified: Secondary | ICD-10-CM | POA: Diagnosis not present

## 2021-02-19 DIAGNOSIS — E785 Hyperlipidemia, unspecified: Secondary | ICD-10-CM | POA: Diagnosis not present

## 2021-02-19 DIAGNOSIS — G4733 Obstructive sleep apnea (adult) (pediatric): Secondary | ICD-10-CM | POA: Diagnosis not present

## 2021-02-19 DIAGNOSIS — I509 Heart failure, unspecified: Secondary | ICD-10-CM | POA: Diagnosis not present

## 2021-02-19 DIAGNOSIS — Z950 Presence of cardiac pacemaker: Secondary | ICD-10-CM | POA: Diagnosis not present

## 2021-02-19 DIAGNOSIS — F419 Anxiety disorder, unspecified: Secondary | ICD-10-CM | POA: Diagnosis not present

## 2021-02-19 DIAGNOSIS — D490 Neoplasm of unspecified behavior of digestive system: Secondary | ICD-10-CM | POA: Diagnosis not present

## 2021-02-20 DIAGNOSIS — D490 Neoplasm of unspecified behavior of digestive system: Secondary | ICD-10-CM | POA: Diagnosis not present

## 2021-02-20 DIAGNOSIS — I110 Hypertensive heart disease with heart failure: Secondary | ICD-10-CM | POA: Diagnosis not present

## 2021-02-20 DIAGNOSIS — I420 Dilated cardiomyopathy: Secondary | ICD-10-CM | POA: Diagnosis not present

## 2021-02-20 DIAGNOSIS — I509 Heart failure, unspecified: Secondary | ICD-10-CM | POA: Diagnosis not present

## 2021-02-20 DIAGNOSIS — Z6841 Body Mass Index (BMI) 40.0 and over, adult: Secondary | ICD-10-CM | POA: Diagnosis not present

## 2021-02-23 DIAGNOSIS — Z9089 Acquired absence of other organs: Secondary | ICD-10-CM | POA: Diagnosis not present

## 2021-02-23 DIAGNOSIS — D37030 Neoplasm of uncertain behavior of the parotid salivary glands: Secondary | ICD-10-CM | POA: Diagnosis not present

## 2021-03-10 DIAGNOSIS — Z48815 Encounter for surgical aftercare following surgery on the digestive system: Secondary | ICD-10-CM | POA: Diagnosis not present

## 2021-03-24 ENCOUNTER — Ambulatory Visit (INDEPENDENT_AMBULATORY_CARE_PROVIDER_SITE_OTHER): Payer: Medicare Other

## 2021-03-24 DIAGNOSIS — I442 Atrioventricular block, complete: Secondary | ICD-10-CM

## 2021-03-24 LAB — CUP PACEART REMOTE DEVICE CHECK
Battery Remaining Longevity: 15 mo
Battery Voltage: 2.9 V
Brady Statistic AP VP Percent: 0.15 %
Brady Statistic AP VS Percent: 0 %
Brady Statistic AS VP Percent: 99.36 %
Brady Statistic AS VS Percent: 0.49 %
Brady Statistic RA Percent Paced: 0.15 %
Brady Statistic RV Percent Paced: 99.4 %
Date Time Interrogation Session: 20220412043823
HighPow Impedance: 44 Ohm
HighPow Impedance: 59 Ohm
Implantable Lead Implant Date: 20071217
Implantable Lead Implant Date: 20071217
Implantable Lead Implant Date: 20071217
Implantable Lead Location: 753858
Implantable Lead Location: 753859
Implantable Lead Location: 753860
Implantable Lead Model: 4194
Implantable Lead Model: 5076
Implantable Lead Model: 6947
Implantable Pulse Generator Implant Date: 20160524
Lead Channel Impedance Value: 247 Ohm
Lead Channel Impedance Value: 304 Ohm
Lead Channel Impedance Value: 342 Ohm
Lead Channel Impedance Value: 418 Ohm
Lead Channel Impedance Value: 532 Ohm
Lead Channel Impedance Value: 665 Ohm
Lead Channel Pacing Threshold Amplitude: 0.75 V
Lead Channel Pacing Threshold Amplitude: 0.75 V
Lead Channel Pacing Threshold Pulse Width: 0.4 ms
Lead Channel Pacing Threshold Pulse Width: 0.8 ms
Lead Channel Sensing Intrinsic Amplitude: 4.375 mV
Lead Channel Sensing Intrinsic Amplitude: 4.375 mV
Lead Channel Setting Pacing Amplitude: 2 V
Lead Channel Setting Pacing Amplitude: 2.5 V
Lead Channel Setting Pacing Pulse Width: 0.4 ms
Lead Channel Setting Pacing Pulse Width: 0.8 ms
Lead Channel Setting Sensing Sensitivity: 0.3 mV

## 2021-04-08 NOTE — Progress Notes (Signed)
Remote ICD transmission.   

## 2021-05-07 ENCOUNTER — Other Ambulatory Visit: Payer: Self-pay | Admitting: Physician Assistant

## 2021-05-07 DIAGNOSIS — E890 Postprocedural hypothyroidism: Secondary | ICD-10-CM

## 2021-05-07 DIAGNOSIS — I10 Essential (primary) hypertension: Secondary | ICD-10-CM

## 2021-05-07 DIAGNOSIS — R739 Hyperglycemia, unspecified: Secondary | ICD-10-CM

## 2021-05-07 DIAGNOSIS — E559 Vitamin D deficiency, unspecified: Secondary | ICD-10-CM

## 2021-05-07 DIAGNOSIS — E785 Hyperlipidemia, unspecified: Secondary | ICD-10-CM

## 2021-05-07 DIAGNOSIS — Z0000 Encounter for general adult medical examination without abnormal findings: Secondary | ICD-10-CM

## 2021-05-12 ENCOUNTER — Other Ambulatory Visit: Payer: Medicare Other

## 2021-05-12 ENCOUNTER — Other Ambulatory Visit: Payer: Self-pay

## 2021-05-12 DIAGNOSIS — R739 Hyperglycemia, unspecified: Secondary | ICD-10-CM

## 2021-05-12 DIAGNOSIS — E785 Hyperlipidemia, unspecified: Secondary | ICD-10-CM | POA: Diagnosis not present

## 2021-05-12 DIAGNOSIS — Z0000 Encounter for general adult medical examination without abnormal findings: Secondary | ICD-10-CM | POA: Diagnosis not present

## 2021-05-12 DIAGNOSIS — E890 Postprocedural hypothyroidism: Secondary | ICD-10-CM

## 2021-05-12 DIAGNOSIS — E559 Vitamin D deficiency, unspecified: Secondary | ICD-10-CM | POA: Diagnosis not present

## 2021-05-12 DIAGNOSIS — I10 Essential (primary) hypertension: Secondary | ICD-10-CM | POA: Diagnosis not present

## 2021-05-13 LAB — COMPREHENSIVE METABOLIC PANEL
ALT: 12 IU/L (ref 0–44)
AST: 15 IU/L (ref 0–40)
Albumin/Globulin Ratio: 1.5 (ref 1.2–2.2)
Albumin: 3.9 g/dL (ref 3.8–4.8)
Alkaline Phosphatase: 94 IU/L (ref 44–121)
BUN/Creatinine Ratio: 13 (ref 10–24)
BUN: 13 mg/dL (ref 8–27)
Bilirubin Total: 0.5 mg/dL (ref 0.0–1.2)
CO2: 25 mmol/L (ref 20–29)
Calcium: 9.4 mg/dL (ref 8.6–10.2)
Chloride: 105 mmol/L (ref 96–106)
Creatinine, Ser: 1.01 mg/dL (ref 0.76–1.27)
Globulin, Total: 2.6 g/dL (ref 1.5–4.5)
Glucose: 101 mg/dL — ABNORMAL HIGH (ref 65–99)
Potassium: 4.6 mmol/L (ref 3.5–5.2)
Sodium: 146 mmol/L — ABNORMAL HIGH (ref 134–144)
Total Protein: 6.5 g/dL (ref 6.0–8.5)
eGFR: 81 mL/min/{1.73_m2} (ref >59)

## 2021-05-13 LAB — LIPID PANEL
Chol/HDL Ratio: 3.1 ratio (ref 0.0–5.0)
Cholesterol, Total: 96 mg/dL — ABNORMAL LOW (ref 100–199)
HDL: 31 mg/dL — ABNORMAL LOW (ref >39)
LDL Chol Calc (NIH): 49 mg/dL (ref 0–99)
Triglycerides: 77 mg/dL (ref 0–149)
VLDL Cholesterol Cal: 16 mg/dL (ref 5–40)

## 2021-05-13 LAB — CBC
Hematocrit: 43.7 % (ref 37.5–51.0)
Hemoglobin: 15.1 g/dL (ref 13.0–17.7)
MCH: 32.8 pg (ref 26.6–33.0)
MCHC: 34.6 g/dL (ref 31.5–35.7)
MCV: 95 fL (ref 79–97)
Platelets: 212 10*3/uL (ref 150–450)
RBC: 4.61 x10E6/uL (ref 4.14–5.80)
RDW: 12.4 % (ref 11.6–15.4)
WBC: 8.5 10*3/uL (ref 3.4–10.8)

## 2021-05-13 LAB — HEMOGLOBIN A1C
Est. average glucose Bld gHb Est-mCnc: 120 mg/dL
Hgb A1c MFr Bld: 5.8 % — ABNORMAL HIGH (ref 4.8–5.6)

## 2021-05-13 LAB — VITAMIN D 25 HYDROXY (VIT D DEFICIENCY, FRACTURES): Vit D, 25-Hydroxy: 54.2 ng/mL (ref 30.0–100.0)

## 2021-05-13 LAB — TSH: TSH: 4.71 u[IU]/mL — ABNORMAL HIGH (ref 0.450–4.500)

## 2021-05-14 ENCOUNTER — Telehealth: Payer: Self-pay | Admitting: Physician Assistant

## 2021-05-14 NOTE — Telephone Encounter (Signed)
-----   Message from Lorrene Reid, [place] sent at 05/13/2021 12:03 PM EDT ----- Please call [facility] and add free T4 and T3.  Thank you, Herb Grays

## 2021-05-14 NOTE — Telephone Encounter (Signed)
Labs have been added. AS, CMA 

## 2021-05-15 ENCOUNTER — Other Ambulatory Visit: Payer: Self-pay | Admitting: Physician Assistant

## 2021-05-15 ENCOUNTER — Other Ambulatory Visit: Payer: Self-pay | Admitting: Cardiovascular Disease

## 2021-05-15 LAB — SPECIMEN STATUS REPORT

## 2021-05-15 LAB — T3: T3, Total: 99 ng/dL (ref 71–180)

## 2021-05-15 LAB — T4, FREE: Free T4: 1.31 ng/dL (ref 0.82–1.77)

## 2021-05-18 ENCOUNTER — Other Ambulatory Visit: Payer: Self-pay

## 2021-05-18 ENCOUNTER — Ambulatory Visit (INDEPENDENT_AMBULATORY_CARE_PROVIDER_SITE_OTHER): Payer: Medicare Other | Admitting: Physician Assistant

## 2021-05-18 ENCOUNTER — Encounter: Payer: Self-pay | Admitting: Physician Assistant

## 2021-05-18 VITALS — BP 121/75 | HR 75 | Temp 98.3°F | Ht 73.0 in | Wt 337.1 lb

## 2021-05-18 DIAGNOSIS — F17200 Nicotine dependence, unspecified, uncomplicated: Secondary | ICD-10-CM | POA: Diagnosis not present

## 2021-05-18 DIAGNOSIS — Z87891 Personal history of nicotine dependence: Secondary | ICD-10-CM | POA: Diagnosis not present

## 2021-05-18 DIAGNOSIS — Z0000 Encounter for general adult medical examination without abnormal findings: Secondary | ICD-10-CM

## 2021-05-18 DIAGNOSIS — E785 Hyperlipidemia, unspecified: Secondary | ICD-10-CM | POA: Diagnosis not present

## 2021-05-18 DIAGNOSIS — Z122 Encounter for screening for malignant neoplasm of respiratory organs: Secondary | ICD-10-CM | POA: Diagnosis not present

## 2021-05-18 DIAGNOSIS — F419 Anxiety disorder, unspecified: Secondary | ICD-10-CM | POA: Diagnosis not present

## 2021-05-18 DIAGNOSIS — R7303 Prediabetes: Secondary | ICD-10-CM

## 2021-05-18 DIAGNOSIS — R911 Solitary pulmonary nodule: Secondary | ICD-10-CM

## 2021-05-18 MED ORDER — SERTRALINE HCL 50 MG PO TABS: 1.0000 | ORAL_TABLET | Freq: Every day | ORAL | 1 refills | Status: DC

## 2021-05-18 MED ORDER — ALPRAZOLAM 0.5 MG PO TABS: 0.5000 mg | ORAL_TABLET | Freq: Three times a day (TID) | ORAL | 0 refills | Status: DC | PRN

## 2021-05-18 NOTE — Patient Instructions (Signed)
Preventive Care 65 Years and Older, Male Preventive care refers to lifestyle choices and visits with your health care provider that can promote health and wellness. This includes:  A yearly physical exam. This is also called an annual wellness visit.  Regular dental and eye exams.  Immunizations.  Screening for certain conditions.  Healthy lifestyle choices, such as: ? Eating a healthy diet. ? Getting regular exercise. ? Not using drugs or products that contain nicotine and tobacco. ? Limiting alcohol use. What can I expect for my preventive care visit? Physical exam Your health care provider will check your:  Height and weight. These may be used to calculate your BMI (body mass index). BMI is a measurement that tells if you are at a healthy weight.  Heart rate and blood pressure.  Body temperature.  Skin for abnormal spots. Counseling Your health care provider may ask you questions about your:  Past medical problems.  Family's medical history.  Alcohol, tobacco, and drug use.  Emotional well-being.  Home life and relationship well-being.  Sexual activity.  Diet, exercise, and sleep habits.  History of falls.  Memory and ability to understand (cognition).  Work and work environment.  Access to firearms. What immunizations do I need? Vaccines are usually given at various ages, according to a schedule. Your health care provider will recommend vaccines for you based on your age, medical history, and lifestyle or other factors, such as travel or where you work.   What tests do I need? Blood tests  Lipid and cholesterol levels. These may be checked every 5 years, or more often depending on your overall health.  Hepatitis C test.  Hepatitis B test. Screening  Lung cancer screening. You may have this screening every year starting at age 55 if you have a 30-pack-year history of smoking and currently smoke or have quit within the past 15 years.  Colorectal  cancer screening. ? All adults should have this screening starting at age 50 and continuing until age 75. ? Your health care provider may recommend screening at age 45 if you are at increased risk. ? You will have tests every 1-10 years, depending on your results and the type of screening test.  Prostate cancer screening. Recommendations will vary depending on your family history and other risks.  Genital exam to check for testicular cancer or hernias.  Diabetes screening. ? This is done by checking your blood sugar (glucose) after you have not eaten for a while (fasting). ? You may have this done every 1-3 years.  Abdominal aortic aneurysm (AAA) screening. You may need this if you are a current or former smoker.  STD (sexually transmitted disease) testing, if you are at risk. Follow these instructions at home: Eating and drinking  Eat a diet that includes fresh fruits and vegetables, whole grains, lean protein, and low-fat dairy products. Limit your intake of foods with high amounts of sugar, saturated fats, and salt.  Take vitamin and mineral supplements as recommended by your health care provider.  Do not drink alcohol if your health care provider tells you not to drink.  If you drink alcohol: ? Limit how much you have to 0-2 drinks a day. ? Be aware of how much alcohol is in your drink. In the [place] one drink equals one 12 oz bottle of beer (355 mL), one 5 oz glass of wine (148 mL), or one 1 oz glass of hard liquor (44 mL).   Lifestyle  Take daily care of your teeth   and gums. Brush your teeth every morning and night with fluoride toothpaste. Floss one time each day.  Stay active. Exercise for at least 30 minutes 5 or more days each week.  Do not use any products that contain nicotine or tobacco, such as cigarettes, e-cigarettes, and chewing tobacco. If you need help quitting, ask your health care provider.  Do not use drugs.  If you are sexually active, practice safe sex.  Use a condom or other form of protection to prevent STIs (sexually transmitted infections).  Talk with your health care provider about taking a low-dose aspirin or statin.  Find healthy ways to cope with stress, such as: ? Meditation, yoga, or listening to music. ? Journaling. ? Talking to a trusted person. ? Spending time with friends and family. Safety  Always wear your seat belt while driving or riding in a vehicle.  Do not drive: ? If you have been drinking alcohol. Do not ride with someone who has been drinking. ? When you are tired or distracted. ? While texting.  Wear a helmet and other protective equipment during sports activities.  If you have firearms in your house, make sure you follow all gun safety procedures. What's next?  Visit your health care provider once a year for an annual wellness visit.  Ask your health care provider how often you should have your eyes and teeth checked.  Stay up to date on all vaccines. This information is not intended to replace advice given to you by your health care provider. Make sure you discuss any questions you have with your health care provider. Document Revised: 08/28/2019 Document Reviewed: 11/23/2018 Elsevier Patient Education  2021 [employer]  

## 2021-05-18 NOTE — Progress Notes (Signed)
Subjective:   Brian Craig is a 70 y.o. male who presents for Medicare Annual/Subsequent preventive examination.  Review of Systems    General:   No F/C, wt loss Pulm:   No DIB, SOB, pleuritic chest pain Card:  No CP, palpitations Abd:  No n/v/d or pain Ext:  No inc edema from baseline    Objective:    There were no vitals filed for this visit. There is no height or weight on file to calculate BMI.  Advanced Directives 07/19/2017 05/12/2017 02/21/2017 05/06/2015  Does Patient Have a Medical Advance Directive? No No No No  Would patient like information on creating a medical advance directive? Yes (Inpatient - patient requests chaplain consult to create a medical advance directive);No - Patient declined Yes (MAU/Ambulatory/Procedural Areas - Information given) Yes (MAU/Ambulatory/Procedural Areas - Information given) No - patient declined information    Current Medications (verified) Outpatient Encounter Medications as of 05/18/2021  Medication Sig  . acetaminophen (TYLENOL) 325 MG tablet Take 650 mg by mouth every 6 (six) hours as needed for headache.  . ALPRAZolam (XANAX) 0.5 MG tablet Take 1 tablet (0.5 mg total) by mouth 3 (three) times daily as needed for anxiety.  . carvedilol (COREG CR) 80 MG 24 hr capsule Take 1 capsule (80 mg total) by mouth daily.  . Cholecalciferol 50 MCG (2000 UT) CAPS Take 2,000 Units by mouth daily.  . furosemide (LASIX) 40 MG tablet Take 40 mg by mouth daily.  Marland Kitchen levothyroxine (SYNTHROID, LEVOTHROID) 200 MCG tablet Take 200 mcg by mouth daily before breakfast.  . lisinopril (ZESTRIL) 10 MG tablet TAKE 1 TABLET TWICE A DAY  . Multiple Vitamin (MULTIVITAMIN) tablet Take 1 tablet by mouth daily.  . potassium chloride (KLOR-CON) 10 MEQ tablet Take 10 mEq by mouth daily.  Marland Kitchen PRADAXA 150 MG CAPS capsule TAKE 1 CAPSULE TWICE DAILY  . sertraline (ZOLOFT) 50 MG tablet TAKE 1 TABLET DAILY  . simvastatin (ZOCOR) 40 MG tablet Take 1 tablet (40 mg total) by mouth  daily at 6 PM.   No facility-administered encounter medications on file as of 05/18/2021.    Allergies (verified) Patient has no known allergies.   History: Past Medical History:  Diagnosis Date  . Atrial fibrillation ([place])   . CHF (congestive heart failure) ([place])   . Graves disease    Graves  . Hyperlipidemia   . Hypertension   . OSA treated with BiPAP   . Substance abuse ([place])    alcohol   Past Surgical History:  Procedure Laterality Date  . APPENDECTOMY    . BIV ICD GENERTAOR CHANGE OUT Left 2012   Medtronic Protecta  . CARDIAC ELECTROPHYSIOLOGY STUDY AND ABLATION    . EP IMPLANTABLE DEVICE N/A 05/06/2015   Procedure: ICD Generator Changeout;  Surgeon: Sanda Klein, MD;  Location: [facility];  Service: Cardiovascular;  Laterality: N/A;  . HERNIA REPAIR    . MENISCUS REPAIR Right   . VASECTOMY     unilateral due to injury   Family History  Problem Relation Age of Onset  . Heart disease Mother   . Hypertension Mother   . Heart disease Father   . Hyperlipidemia Father   . Hypertension Father   . Heart attack Father   . Heart disease Paternal Aunt   . Heart disease Paternal Uncle   . Alcohol abuse Maternal Uncle   . Alcohol abuse Maternal Grandfather    Social History   Socioeconomic History  . Marital status: Married  Spouse name: Not on file  . Number of children: Not on file  . Years of education: Not on file  . Highest education level: Not on file  Occupational History  . Not on file  Tobacco Use  . Smoking status: Current Every Day Smoker    Packs/day: 0.50    Years: 51.00    Pack years: 25.50    Types: Cigarettes  . Smokeless tobacco: Never Used  Substance and Sexual Activity  . Alcohol use: No    Comment: recovering alcoholic  . Drug use: No  . Sexual activity: Yes    Birth control/protection: None  Other Topics Concern  . Not on file  Social History Narrative  . Not on file   Social Determinants of Health   Financial  Resource Strain: Not on file  Food Insecurity: Not on file  Transportation Needs: Not on file  Physical Activity: Not on file  Stress: Not on file  Social Connections: Not on file    Tobacco Counseling Ready to quit: Not Answered Counseling given: Not Answered    Diabetic? no   Activities of Daily Living In your present state of health, do you have any difficulty performing the following activities: 01/12/2021  Hearing? N  Vision? N  Difficulty concentrating or making decisions? N  Walking or climbing stairs? Y  Dressing or bathing? N  Doing errands, shopping? N  Some recent data might be hidden    Patient Care Team: Lorrene Reid, PA-C as PCP - General (Physician Assistant) Brendolyn Patty, MD (Dermatology) Warden Fillers, MD as Consulting Physician (Ophthalmology) Amalia Greenhouse, MD as Referring Physician (Endocrinology) Croitoru, Dani Gobble, MD as Consulting Physician (Cardiology)  Indicate any recent Medical Services you may have received from other than [facility] providers in the past year (date may be approximate).     Assessment:   This is a routine wellness examination for Brian Craig.  Hearing/Vision screen No exam data present  Dietary issues and exercise activities discussed: -Recommend to reduce carbohydrates and glucose intake, follow a heart healthy diet. Increase physical activity such as walking for 15-30 minutes daily.  Goals Addressed   None    Depression Screen PHQ 2/9 Scores 01/12/2021 01/12/2021 04/14/2020 09/24/2019 04/09/2019 03/19/2019 01/10/2019  PHQ - 2 Score 0 0 0 0 0 0 0  PHQ- 9 Score 0 0 0 0 0 0 0    Fall Risk Fall Risk  01/12/2021 04/14/2020 04/09/2019 02/22/2018 02/21/2017  Falls in the past year? 0 0 0 No No  Follow up Falls evaluation completed Falls evaluation completed - - -    FALL RISK PREVENTION PERTAINING TO THE HOME:  Any stairs in or around the home? Yes  If so, are there any without handrails? No  Home free of loose throw rugs in  walkways, pet beds, electrical cords, etc? Yes  Adequate lighting in your home to reduce risk of falls? Yes   ASSISTIVE DEVICES UTILIZED TO PREVENT FALLS:  Life alert? No  Use of a cane, walker or w/c? No  Grab bars in the bathroom? Yes  Shower chair or bench in shower? No Elevated toilet seat or a handicapped toilet? No   TIMED UP AND GO:  Was the test performed? Yes .  Length of time to ambulate 10 feet: 12 sec.   Gait steady and fast without use of assistive device  Cognitive Function: stable    6CIT Screen 05/18/2021 04/14/2020 04/09/2019  What Year? 0 points 0 points 0 points  What month? 0 points  0 points 0 points  What time? 0 points 0 points 0 points  Count back from 20 0 points 0 points 0 points  Months in reverse 2 points 0 points 0 points  Repeat phrase 0 points 2 points 2 points  Total Score 2 2 2     Immunizations Immunization History  Administered Date(s) Administered  . PFIZER(Purple Top)SARS-COV-2 Vaccination 01/27/2020, 02/19/2020  . Pneumococcal Conjugate-13 02/22/2018  . Pneumococcal Polysaccharide-23 04/24/2019    TDAP status: Due, Education has been provided regarding the importance of this vaccine. Advised may receive this vaccine at local pharmacy or Health Dept. Aware to provide a copy of the vaccination record if obtained from local pharmacy or Health Dept. Verbalized acceptance and understanding.  Flu Vaccine status: Up to date  Pneumococcal vaccine status: Up to date  Covid-19 vaccine status: Completed vaccines  Qualifies for Shingles Vaccine? Yes   Zostavax completed No   Shingrix Completed?: No.    Education has been provided regarding the importance of this vaccine. Patient has been advised to call insurance company to determine out of pocket expense if they have not yet received this vaccine. Advised may also receive vaccine at local pharmacy or Health Dept. Verbalized acceptance and understanding.  Screening Tests Health Maintenance  Topic  Date Due  . Pneumococcal Vaccine 76-5 Years old (1 of 4 - PCV13) Never done  . TETANUS/TDAP  Never done  . COLONOSCOPY (Pts 45-69yrs Insurance coverage will need to be confirmed)  Never done  . Zoster Vaccines- Shingrix (1 of 2) Never done  . COVID-19 Vaccine (3 - Booster for Pfizer series) 07/21/2020  . INFLUENZA VACCINE  07/13/2021  . Hepatitis C Screening  Completed  . PNA vac Low Risk Adult  Completed  . HPV VACCINES  Aged Out    Health Maintenance  Health Maintenance Due  Topic Date Due  . Pneumococcal Vaccine 45-75 Years old (1 of 4 - PCV13) Never done  . TETANUS/TDAP  Never done  . COLONOSCOPY (Pts 45-10yrs Insurance coverage will need to be confirmed)  Never done  . Zoster Vaccines- Shingrix (1 of 2) Never done  . COVID-19 Vaccine (3 - Booster for Pfizer series) 07/21/2020    Colorectal cancer screening: Referral to GI placed colonoscopy. Pt aware the office will call re: appt.  Lung Cancer Screening: (Low Dose CT Chest recommended if Age 64-80 years, 30 pack-year currently smoking OR have quit w/in 15years.) does qualify.   Lung Cancer Screening Referral: Placed 05/18/2021  Additional Screening:  Hepatitis C Screening: does qualify; Completed 03/17/2020.  Vision Screening: Recommended annual ophthalmology exams for early detection of glaucoma and other disorders of the eye. Is the patient up to date with their annual eye exam?  Yes  Who is the provider or what is the name of the office in which the patient attends annual eye exams? Unable to recall If pt is not established with a provider, would they like to be referred to a provider to establish care? No .   Dental Screening: Recommended annual dental exams for proper oral hygiene  Community Resource Referral / Chronic Care Management: CRR required this visit?  No   CCM required this visit?  No      Plan:  -Discussed with patient recent lab results, most are essentially within normal limits or stable from prior  with the exception of A1c and TSH. Will repeat thyroid labs in 6 weeks, if TSH continues to be mildly elevated recommend to follow up with endocrinologist. -Continue to  follow up with various specialists including cardiology. -Encourage to consider tobacco cessation. -Continue current medication regimen. Provided refills. -Schedule lab visit in 6 weeks for thyroid panel. -Follow up in 4 months for HTN, HLD, preMD, mood.  I have personally reviewed and noted the following in the patient's chart:   . Medical and social history . Use of alcohol, tobacco or illicit drugs  . Current medications and supplements including opioid prescriptions. Patient is not currently taking opioid prescriptions. . Functional ability and status . Nutritional status . Physical activity . Advanced directives . List of other physicians . Hospitalizations, surgeries, and ER visits in previous 12 months . Vitals . Screenings to include cognitive, depression, and falls . Referrals and appointments  In addition, I have reviewed and discussed with patient certain preventive protocols, quality metrics, and best practice recommendations. A written personalized care plan for preventive services as well as general preventive health recommendations were provided to patient.     Aron Baba, [place]   05/18/2021

## 2021-06-17 ENCOUNTER — Other Ambulatory Visit: Payer: Self-pay | Admitting: Cardiovascular Disease

## 2021-06-22 DIAGNOSIS — Z20822 Contact with and (suspected) exposure to covid-19: Secondary | ICD-10-CM | POA: Diagnosis not present

## 2021-06-23 ENCOUNTER — Ambulatory Visit (INDEPENDENT_AMBULATORY_CARE_PROVIDER_SITE_OTHER): Payer: Medicare Other

## 2021-06-23 DIAGNOSIS — I442 Atrioventricular block, complete: Secondary | ICD-10-CM | POA: Diagnosis not present

## 2021-06-23 LAB — CUP PACEART REMOTE DEVICE CHECK
Battery Remaining Longevity: 13 mo
Battery Voltage: 2.88 V
Brady Statistic AP VP Percent: 0 %
Brady Statistic AP VS Percent: 0 %
Brady Statistic AS VP Percent: 99.43 %
Brady Statistic AS VS Percent: 0.57 %
Brady Statistic RA Percent Paced: 0 %
Brady Statistic RV Percent Paced: 99.02 %
Date Time Interrogation Session: 20220712032205
HighPow Impedance: 44 Ohm
HighPow Impedance: 59 Ohm
Implantable Lead Implant Date: 20071217
Implantable Lead Implant Date: 20071217
Implantable Lead Implant Date: 20071217
Implantable Lead Location: 753858
Implantable Lead Location: 753859
Implantable Lead Location: 753860
Implantable Lead Model: 4194
Implantable Lead Model: 5076
Implantable Lead Model: 6947
Implantable Pulse Generator Implant Date: 20160524
Lead Channel Impedance Value: 247 Ohm
Lead Channel Impedance Value: 285 Ohm
Lead Channel Impedance Value: 304 Ohm
Lead Channel Impedance Value: 399 Ohm
Lead Channel Impedance Value: 513 Ohm
Lead Channel Impedance Value: 665 Ohm
Lead Channel Pacing Threshold Amplitude: 0.625 V
Lead Channel Pacing Threshold Amplitude: 1 V
Lead Channel Pacing Threshold Pulse Width: 0.4 ms
Lead Channel Pacing Threshold Pulse Width: 0.8 ms
Lead Channel Sensing Intrinsic Amplitude: 4.375 mV
Lead Channel Sensing Intrinsic Amplitude: 4.375 mV
Lead Channel Setting Pacing Amplitude: 2 V
Lead Channel Setting Pacing Amplitude: 2.5 V
Lead Channel Setting Pacing Pulse Width: 0.4 ms
Lead Channel Setting Pacing Pulse Width: 0.8 ms
Lead Channel Setting Sensing Sensitivity: 0.3 mV

## 2021-06-29 ENCOUNTER — Other Ambulatory Visit: Payer: Medicare Other

## 2021-06-30 ENCOUNTER — Other Ambulatory Visit: Payer: Self-pay

## 2021-06-30 ENCOUNTER — Other Ambulatory Visit: Payer: Medicare Other

## 2021-06-30 DIAGNOSIS — E890 Postprocedural hypothyroidism: Secondary | ICD-10-CM | POA: Diagnosis not present

## 2021-07-01 DIAGNOSIS — H02831 Dermatochalasis of right upper eyelid: Secondary | ICD-10-CM | POA: Diagnosis not present

## 2021-07-01 DIAGNOSIS — H04123 Dry eye syndrome of bilateral lacrimal glands: Secondary | ICD-10-CM | POA: Diagnosis not present

## 2021-07-01 DIAGNOSIS — H16213 Exposure keratoconjunctivitis, bilateral: Secondary | ICD-10-CM | POA: Diagnosis not present

## 2021-07-01 DIAGNOSIS — G4730 Sleep apnea, unspecified: Secondary | ICD-10-CM | POA: Diagnosis not present

## 2021-07-01 DIAGNOSIS — H16143 Punctate keratitis, bilateral: Secondary | ICD-10-CM | POA: Diagnosis not present

## 2021-07-01 DIAGNOSIS — H2513 Age-related nuclear cataract, bilateral: Secondary | ICD-10-CM | POA: Diagnosis not present

## 2021-07-01 DIAGNOSIS — H02834 Dermatochalasis of left upper eyelid: Secondary | ICD-10-CM | POA: Diagnosis not present

## 2021-07-01 DIAGNOSIS — E0500 Thyrotoxicosis with diffuse goiter without thyrotoxic crisis or storm: Secondary | ICD-10-CM | POA: Diagnosis not present

## 2021-07-01 DIAGNOSIS — H0520 Unspecified exophthalmos: Secondary | ICD-10-CM | POA: Diagnosis not present

## 2021-07-01 LAB — TSH: TSH: 8.16 u[IU]/mL — ABNORMAL HIGH (ref 0.450–4.500)

## 2021-07-01 LAB — T4, FREE: Free T4: 1.38 ng/dL (ref 0.82–1.77)

## 2021-07-01 LAB — T3: T3, Total: 84 ng/dL (ref 71–180)

## 2021-07-13 ENCOUNTER — Ambulatory Visit
Admission: RE | Admit: 2021-07-13 | Discharge: 2021-07-13 | Disposition: A | Discharge status: Home / Self Care | Payer: Medicare Other | Source: Ambulatory Visit | Attending: Physician Assistant | Admitting: Physician Assistant

## 2021-07-13 ENCOUNTER — Other Ambulatory Visit: Payer: Self-pay

## 2021-07-13 DIAGNOSIS — R911 Solitary pulmonary nodule: Secondary | ICD-10-CM

## 2021-07-13 DIAGNOSIS — F17210 Nicotine dependence, cigarettes, uncomplicated: Secondary | ICD-10-CM | POA: Diagnosis not present

## 2021-07-13 DIAGNOSIS — Z87891 Personal history of nicotine dependence: Secondary | ICD-10-CM

## 2021-07-13 DIAGNOSIS — F17200 Nicotine dependence, unspecified, uncomplicated: Secondary | ICD-10-CM

## 2021-07-16 NOTE — Progress Notes (Signed)
Remote ICD transmission.   

## 2021-07-22 ENCOUNTER — Telehealth: Payer: Self-pay | Admitting: Physician Assistant

## 2021-07-22 NOTE — Telephone Encounter (Signed)
Patient called office and left msg to schedule apt. Called patient back. No answer, left msg to return call. AS, CMA

## 2021-07-23 ENCOUNTER — Other Ambulatory Visit: Payer: Self-pay | Admitting: Physician Assistant

## 2021-07-23 DIAGNOSIS — K8020 Calculus of gallbladder without cholecystitis without obstruction: Secondary | ICD-10-CM

## 2021-07-23 DIAGNOSIS — R1110 Vomiting, unspecified: Secondary | ICD-10-CM

## 2021-07-28 ENCOUNTER — Encounter: Payer: Self-pay | Admitting: Physician Assistant

## 2021-07-30 ENCOUNTER — Encounter: Payer: Self-pay | Admitting: Internal Medicine

## 2021-09-07 ENCOUNTER — Ambulatory Visit (INDEPENDENT_AMBULATORY_CARE_PROVIDER_SITE_OTHER): Payer: Medicare Other | Admitting: Internal Medicine

## 2021-09-07 ENCOUNTER — Encounter: Payer: Self-pay | Admitting: Internal Medicine

## 2021-09-07 VITALS — BP 110/60 | HR 82 | Ht 73.0 in | Wt 331.4 lb

## 2021-09-07 DIAGNOSIS — I2510 Atherosclerotic heart disease of native coronary artery without angina pectoris: Secondary | ICD-10-CM | POA: Diagnosis not present

## 2021-09-07 DIAGNOSIS — M7918 Myalgia, other site: Secondary | ICD-10-CM | POA: Diagnosis not present

## 2021-09-07 DIAGNOSIS — R935 Abnormal findings on diagnostic imaging of other abdominal regions, including retroperitoneum: Secondary | ICD-10-CM

## 2021-09-07 DIAGNOSIS — D179 Benign lipomatous neoplasm, unspecified: Secondary | ICD-10-CM | POA: Diagnosis not present

## 2021-09-07 DIAGNOSIS — K8020 Calculus of gallbladder without cholecystitis without obstruction: Secondary | ICD-10-CM | POA: Diagnosis not present

## 2021-09-07 NOTE — Progress Notes (Signed)
HISTORY OF PRESENT ILLNESS:  Brian Craig is a 70 y.o. male with multiple medical problems including a history of atrial fibrillation status post ablation, congestive heart failure, hypothyroidism, hypertension, hyperlipidemia, obstructive sleep apnea, morbid obesity, and chronic ongoing tobacco abuse.  The patient underwent a CT chest lung cancer screening study July 13, 2021 due to longstanding smoking.  He was found to have emphysema, aortic atherosclerosis, coronary artery calcifications, and lung RADS 2.  He was also noted to have cholelithiasis.  Patient presents today with concerns over right upper quadrant abdominal pain being related to the known gallstones.  Patient states that for about 2 and half months he has noticed, after long periods of sitting at his desk for his job, that he develops right upper quadrant pain upon standing up.  This is predictable and generally lasts several minutes with resolution upon walking.  He does not experience this issue at other times.  No nocturnal symptoms.  This is not affected by meals.  No radiation.  The only other GI issue that he mentions is occasionally feeling sick after certain meals for which she self induces vomiting.  He does tell me that he has had a lump or bump on his left abdomen for about 15 years.  He questions the etiology or significance.  It has been unchanged during that period of time and causes no symptoms.  Blood work from May 12, 2021 shows unremarkable comprehensive metabolic panel and CBC with hemoglobin 15.1.  REVIEW OF SYSTEMS:  All non-GI ROS negative unless otherwise stated in the HPI except for anxiety, lower extremity edema  Past Medical History:  Diagnosis Date   Atrial fibrillation ([place])    CHF (congestive heart failure) ([place])    Graves disease    Graves   Hyperlipidemia    Hypertension    OSA treated with BiPAP    Substance abuse ([place])    alcohol    Past Surgical History:  Procedure Laterality Date    APPENDECTOMY     BIV ICD GENERTAOR CHANGE OUT Left 2012   Medtronic Protecta   CARDIAC ELECTROPHYSIOLOGY STUDY AND ABLATION     EP IMPLANTABLE DEVICE N/A 05/06/2015   Procedure: ICD Generator Changeout;  Surgeon: Sanda Klein, MD;  Location: [facility];  Service: Cardiovascular;  Laterality: N/A;   HERNIA REPAIR     MENISCUS REPAIR Right    VASECTOMY     unilateral due to injury    Social History Brian Craig  reports that he has been smoking cigarettes. He has a 25.50 pack-year smoking history. He has never used smokeless tobacco. He reports that he does not drink alcohol and does not use drugs.  family history includes Alcohol abuse in his maternal grandfather and maternal uncle; Heart attack in his father; Heart disease in his father, mother, paternal aunt, and paternal uncle; Hyperlipidemia in his father; Hypertension in his father and mother.  No Known Allergies     PHYSICAL EXAMINATION: Vital signs: BP 110/60   Pulse 82   Ht 6\' 1"  (1.854 m)   Wt (!) 331 lb 6 oz (150.3 kg)   BMI 43.72 kg/m   Constitutional: Obese, unhealthy appearing male, no acute distress Psychiatric: alert and oriented x3, cooperative Eyes: extraocular movements intact, anicteric, conjunctiva pink Mouth: Mask Neck: supple no lymphadenopathy Cardiovascular: heart regular rate and rhythm, no murmur Lungs: clear to auscultation bilaterally Abdomen: soft, obese, nontender, nondistended, no obvious ascites, no peritoneal signs, normal bowel sounds, no organomegaly.  Palpable small superficial  mass left abdominal wall measuring about 3 cm.  Most consistent with benign lipoma Rectal: Omitted Extremities: no clubbing or cyanosis.  1+ lower extremity edema bilaterally Skin: no relevant lesions on visible extremities Neuro: No focal deficits.  Cranial nerves intact  ASSESSMENT:  1.  Right upper quadrant pain is musculoskeletal in nature. 2.  Incidental gallstones 3.  Benign abdominal wall  lipoma 4.  Morbid obesity 5.  General medical problems   PLAN:  1.  Reassurance regarding his pain.  Recommend that he take periodic breaks from sitting, as this may help. 2.  Discussion on signs or symptoms of gallbladder disease 3.  Reassurance regarding chronic stable abdominal wall lipoma 4.  Exercise and weight loss 5.  Resume general medical care with PCP

## 2021-09-07 NOTE — Patient Instructions (Signed)
If you are age 70 or older, your body mass index should be between 23-30. Your Body mass index is 43.72 kg/m. If this is out of the aforementioned range listed, please consider follow up with your Primary Care Provider.  If you are age 70 or younger, your body mass index should be between 19-25. Your Body mass index is 43.72 kg/m. If this is out of the aformentioned range listed, please consider follow up with your Primary Care Provider.   __________________________________________________________  The [facility] providers would like to encourage you to use [facility] to communicate with providers for non-urgent requests or questions.  Due to long hold times on the telephone, sending your provider a message by [facility] may be a faster and more efficient way to get a response.  Please allow 48 business hours for a response.  Please remember that this is for non-urgent requests.   Please follow up as needed

## 2021-09-09 ENCOUNTER — Telehealth: Payer: Self-pay | Admitting: Cardiovascular Disease

## 2021-09-09 MED ORDER — POTASSIUM CHLORIDE CRYS ER 10 MEQ PO TBCR: 10.0000 meq | EXTENDED_RELEASE_TABLET | Freq: Every day | ORAL | 0 refills | Status: DC

## 2021-09-09 NOTE — Telephone Encounter (Signed)
 *  STAT* If patient is at the pharmacy, call can be transferred to refill team.   1. Which medications need to be refilled? (please list name of each medication and dose if known)   potassium chloride (KLOR-CON) 10 MEQ tablet    2. Which pharmacy/location (including street and city if local pharmacy) is medication to be sent to? [facility], [place]  3. Do they need a 30 day or 90 day supply? 90 days

## 2021-09-22 ENCOUNTER — Ambulatory Visit (INDEPENDENT_AMBULATORY_CARE_PROVIDER_SITE_OTHER): Payer: Medicare Other

## 2021-09-22 DIAGNOSIS — I442 Atrioventricular block, complete: Secondary | ICD-10-CM | POA: Diagnosis not present

## 2021-09-22 LAB — CUP PACEART REMOTE DEVICE CHECK
Battery Remaining Longevity: 10 mo
Battery Voltage: 2.87 V
Brady Statistic AP VP Percent: 0 %
Brady Statistic AP VS Percent: 0 %
Brady Statistic AS VP Percent: 99.67 %
Brady Statistic AS VS Percent: 0.33 %
Brady Statistic RA Percent Paced: 0 %
Brady Statistic RV Percent Paced: 99.39 %
Date Time Interrogation Session: 20221011033625
HighPow Impedance: 44 Ohm
HighPow Impedance: 59 Ohm
Implantable Lead Implant Date: 20071217
Implantable Lead Implant Date: 20071217
Implantable Lead Implant Date: 20071217
Implantable Lead Location: 753858
Implantable Lead Location: 753859
Implantable Lead Location: 753860
Implantable Lead Model: 4194
Implantable Lead Model: 5076
Implantable Lead Model: 6947
Implantable Pulse Generator Implant Date: 20160524
Lead Channel Impedance Value: 247 Ohm
Lead Channel Impedance Value: 285 Ohm
Lead Channel Impedance Value: 342 Ohm
Lead Channel Impedance Value: 418 Ohm
Lead Channel Impedance Value: 532 Ohm
Lead Channel Impedance Value: 703 Ohm
Lead Channel Pacing Threshold Amplitude: 0.875 V
Lead Channel Pacing Threshold Amplitude: 0.875 V
Lead Channel Pacing Threshold Pulse Width: 0.4 ms
Lead Channel Pacing Threshold Pulse Width: 0.8 ms
Lead Channel Sensing Intrinsic Amplitude: 4.375 mV
Lead Channel Sensing Intrinsic Amplitude: 4.375 mV
Lead Channel Setting Pacing Amplitude: 2 V
Lead Channel Setting Pacing Amplitude: 2.5 V
Lead Channel Setting Pacing Pulse Width: 0.4 ms
Lead Channel Setting Pacing Pulse Width: 0.8 ms
Lead Channel Setting Sensing Sensitivity: 0.3 mV

## 2021-09-28 ENCOUNTER — Other Ambulatory Visit: Payer: Self-pay | Admitting: Cardiovascular Disease

## 2021-09-30 NOTE — Progress Notes (Signed)
Remote ICD transmission.   

## 2021-11-02 ENCOUNTER — Other Ambulatory Visit: Payer: Self-pay

## 2021-11-02 ENCOUNTER — Ambulatory Visit
Admission: EM | Admit: 2021-11-02 | Discharge: 2021-11-02 | Disposition: A | Discharge status: Home / Self Care | Payer: Medicare Other | Attending: Emergency Medicine | Admitting: Emergency Medicine

## 2021-11-02 DIAGNOSIS — R0981 Nasal congestion: Secondary | ICD-10-CM

## 2021-11-02 DIAGNOSIS — R051 Acute cough: Secondary | ICD-10-CM | POA: Diagnosis not present

## 2021-11-02 DIAGNOSIS — R52 Pain, unspecified: Secondary | ICD-10-CM | POA: Diagnosis not present

## 2021-11-02 DIAGNOSIS — J1100 Influenza due to unidentified influenza virus with unspecified type of pneumonia: Secondary | ICD-10-CM | POA: Diagnosis not present

## 2021-11-02 DIAGNOSIS — J101 Influenza due to other identified influenza virus with other respiratory manifestations: Secondary | ICD-10-CM | POA: Diagnosis not present

## 2021-11-02 DIAGNOSIS — J441 Chronic obstructive pulmonary disease with (acute) exacerbation: Secondary | ICD-10-CM | POA: Diagnosis not present

## 2021-11-02 DIAGNOSIS — R519 Headache, unspecified: Secondary | ICD-10-CM | POA: Diagnosis not present

## 2021-11-02 LAB — POCT INFLUENZA A/B
Influenza A, POC: POSITIVE — AB
Influenza B, POC: NEGATIVE

## 2021-11-02 MED ORDER — OSELTAMIVIR PHOSPHATE 75 MG PO CAPS: 75.0000 mg | ORAL_CAPSULE | Freq: Two times a day (BID) | ORAL | 0 refills | Status: AC

## 2021-11-02 MED ORDER — ALBUTEROL SULFATE HFA 108 (90 BASE) MCG/ACT IN AERS: 1.0000 | INHALATION_SPRAY | Freq: Four times a day (QID) | RESPIRATORY_TRACT | 0 refills | Status: DC | PRN

## 2021-11-02 MED ORDER — DOXYCYCLINE HYCLATE 100 MG PO CAPS: 100.0000 mg | ORAL_CAPSULE | Freq: Two times a day (BID) | ORAL | 0 refills | Status: AC

## 2021-11-02 MED ORDER — FLUTICASONE-SALMETEROL 250-50 MCG/ACT IN AEPB: 1.0000 | INHALATION_SPRAY | Freq: Two times a day (BID) | RESPIRATORY_TRACT | 1 refills | Status: DC

## 2021-11-02 MED ORDER — AMOXICILLIN-POT CLAVULANATE 875-125 MG PO TABS: 1.0000 | ORAL_TABLET | Freq: Two times a day (BID) | ORAL | 0 refills | Status: AC

## 2021-11-02 MED ORDER — AEROCHAMBER PLUS FLO-VU LARGE MISC: 1.0000 | Freq: Once | 0 refills | Status: AC

## 2021-11-02 NOTE — ED Triage Notes (Signed)
Two day h/o cough, congestion, HA, body aches and chills. Negative at home covid test. Denies v/d. Has been taking mucinex, flonase, neb tx and tylenol with minimal relief. Wife w/similar sxs.

## 2021-11-02 NOTE — Discharge Instructions (Signed)
You tested positive for influenza A during her visit today.  Because you are inside the 3-day window for treatment of influenza A, I recommend that you begin taking Tamiflu, 1 tablet twice daily for the next 5 days.  Because your wife is also having symptoms, please encourage her to reach out to her primary care provider to see if they recommend she begin taking Tamiflu as well.  According to CDC guidelines, members of the same household do not need to be tested for influenza when another household member tests positive.  You have easily audible wheezing and rales on exam today, I am concerned that you may be developing pneumonia secondary to influenza.  To treat the wheezing, I recommend that you begin using a twice daily inhaler called Advair, this is a medication that contains a long-acting albuterol as well as a long-acting inhaled corticosteroid.  Please be sure you rinse your mouth out after each use to prevent thrush.  You can also continue using your sisters nebulized albuterol as needed throughout the day for coughing and wheezing, I have also provided you with a prescription for your own albuterol inhaler that you can carry with you.  Please reach out to your primary care provider to discuss extending use of Advair for prevention of future episodes of pneumonia.  For prevention of fulminant bacterial pneumonia, recommend that you begin 2 antibiotics, Augmentin and doxycycline.  After reviewing recent guidelines, azithromycin is not indicated in patients with history of atrial fibrillation therefore doxycycline is her next best choice.  Please reach out to your primary care provider to let them know that you have been diagnosed with pneumonia and influenza and see if you can schedule an appointment for follow-up in the next 2 to 3 days.  It is important that you have close evaluation.  If you are unable to get in with your primary care provider, please feel free to come back to urgent care and I  will be happy to reevaluate you myself.  Thank you for visiting urgent care this morning, I hope you feel better soon.

## 2021-11-02 NOTE — ED Provider Notes (Signed)
[facility]    CSN: [id number] Arrival date & time: 11/02/21  1041   History   Chief Complaint Chief Complaint  Patient presents with   Cough   Generalized Body Aches   HPI Brian Craig is a 70 y.o. male. Patient is a 70 year old male who reports a history of subacute COPD not currently on any inhalers.  Patient states he has a sister who is letting him use her nebulizer and albuterol treatment.  Patient states he has a 2-day history of cough, congestion, headache, body aches and chills.  Patient states his wife at home has a similar symptoms.  Patient states in addition to borrowing his sisters albuterol, he has been taking Mucinex, Flonase and Tylenol with minimal relief.  Patient states he took home COVID test yesterday that was negative.  Patient states his cough is nonproductive.  Patient denies nausea, vomiting, diarrhea.  Patient denies known sick contacts.  The history is provided by the patient.   Past Medical History:  Diagnosis Date   Atrial fibrillation ([place])    CHF (congestive heart failure) ([place])    Graves disease    Graves   Hyperlipidemia    Hypertension    OSA treated with BiPAP    Substance abuse ([employer])    alcohol   Patient Active Problem List   Diagnosis Date Noted   Pre-op evaluation 02/02/2021   CAD (coronary artery disease) 12/23/2020   Dyspnea 12/23/2020   Emphysema lung ([place]) 12/23/2020   Spondylolysis, lumbar region 08/19/2020   Spondylolisthesis, lumbar region 08/19/2020   Encounter for Medicare annual wellness exam 04/09/2019   Otitis media 01/10/2019   Biventricular automatic implantable cardioverter defibrillator in situ 09/05/2018   Postablative hypothyroidism 09/05/2018   BMI 40.0-44.9, adult ([place]) 08/28/2018   Acute bronchitis 04/25/2018   Cough in adult 04/25/2018   Dyslipidemia 02/22/2018   Testicular swelling, left 10/10/2017   Essential hypertension 08/25/2017   Anxiety 08/25/2017   Healthcare maintenance 08/25/2017    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   Localized swelling, mass or lump of neck 06/01/2017   Vitamin D deficiency 03/04/2017   Long term current use of anticoagulant 09/20/2016   Chronic diastolic heart failure ([place]) 02/02/2016   History of alcohol dependence ([place]) 02/02/2016   ICD (implantable cardioverter-defibrillator) battery depletion 04/17/2015   Congestive dilated cardiomyopathy ([place]) 07/15/2014   Chronic combined systolic and diastolic CHF, NYHA class 1 ([place]) 07/15/2014   OSA on CPAP 07/15/2014   Permanent atrial fibrillation ([place]) 07/15/2014   CHB (complete heart block) ([place]) 07/15/2014   Biventricular ICD  implanted 2007, generator change in 2012 07/15/2014   Tobacco abuse 07/15/2014   Alcoholism in recovery ([place]) 07/15/2014   Hypothyroidism, postradioiodine therapy 07/15/2014   Dyslipidemia (high LDL; low HDL) 07/15/2014   Past Surgical History:  Procedure Laterality Date   APPENDECTOMY     BIV ICD GENERTAOR CHANGE OUT Left 2012   Medtronic Protecta   CARDIAC ELECTROPHYSIOLOGY STUDY AND ABLATION     EP IMPLANTABLE DEVICE N/A 05/06/2015   Procedure: ICD Generator Changeout;  Surgeon: Sanda Klein, MD;  Location: [facility];  Service: Cardiovascular;  Laterality: N/A;   HERNIA REPAIR     MENISCUS REPAIR Right    VASECTOMY     unilateral due to injury    Home Medications    Prior to Admission medications   Medication Sig Start Date End Date Taking? Authorizing Provider  albuterol (VENTOLIN HFA) 108 (90 Base) MCG/ACT inhaler Inhale 1-2 puffs into the lungs  every 6 (six) hours as needed for wheezing or shortness of breath. 11/02/21  Yes Lynden Oxford Scales, PA-C  amoxicillin-clavulanate (AUGMENTIN) 875-125 MG tablet Take 1 tablet by mouth every 12 (twelve) hours for 7 days. 11/02/21 11/09/21 Yes Lynden Oxford Scales, PA-C  doxycycline (VIBRAMYCIN) 100 MG capsule Take 1 capsule (100 mg total) by mouth 2 (two) times daily for 7 days. 11/02/21 11/09/21 Yes  Lynden Oxford Scales, PA-C  fluticasone-salmeterol (ADVAIR DISKUS) 250-50 MCG/ACT AEPB Inhale 1 puff into the lungs in the morning and at bedtime. 11/02/21 01/01/22 Yes Lynden Oxford Scales, PA-C  oseltamivir (TAMIFLU) 75 MG capsule Take 1 capsule (75 mg total) by mouth 2 (two) times daily for 5 days. 11/02/21 11/07/21 Yes Lynden Oxford Scales, PA-C  Spacer/Aero-Holding Chambers (AEROCHAMBER PLUS FLO-VU LARGE) MISC 1 each by Other route once for 1 dose. 11/02/21 11/02/21 Yes Lynden Oxford Scales, PA-C  acetaminophen (TYLENOL) 325 MG tablet Take 650 mg by mouth every 6 (six) hours as needed for headache.    [provider]  ALPRAZolam Duanne Moron) 0.5 MG tablet Take 1 tablet (0.5 mg total) by mouth 3 (three) times daily as needed for anxiety. 05/18/21   Lorrene Reid, PA-C  carvedilol (COREG CR) 80 MG 24 hr capsule Take 1 capsule (80 mg total) by mouth daily. 05/15/21 05/15/22  Croitoru, Mihai, MD  Cholecalciferol 50 MCG (2000 UT) CAPS Take 2,000 Units by mouth daily.    [provider]  furosemide (LASIX) 40 MG tablet Take 40 mg by mouth daily.    [provider]  levothyroxine (SYNTHROID, LEVOTHROID) 200 MCG tablet Take 200 mcg by mouth daily before breakfast.    [provider]  lisinopril (ZESTRIL) 10 MG tablet TAKE 1 TABLET TWICE A DAY 01/06/21   Croitoru, Mihai, MD  Multiple Vitamin (MULTIVITAMIN) tablet Take 1 tablet by mouth daily.    [provider]  potassium chloride (KLOR-CON) 10 MEQ tablet Take 1 tablet (10 mEq total) by mouth daily. 09/09/21   Croitoru, Mihai, MD  PRADAXA 150 MG CAPS capsule TAKE 1 CAPSULE TWICE DAILY 09/29/21   Croitoru, Mihai, MD  sertraline (ZOLOFT) 50 MG tablet Take 1 tablet (50 mg total) by mouth daily. 05/18/21   Lorrene Reid, PA-C  simvastatin (ZOCOR) 40 MG tablet Take 1 tablet (40 mg total) by mouth daily at 6 PM. 05/15/21 05/15/22  Croitoru, Mihai, MD   Family History Family History  Problem Relation Age of Onset   Heart  disease Mother    Hypertension Mother    Heart disease Father    Hyperlipidemia Father    Hypertension Father    Heart attack Father    Heart disease Paternal Aunt    Heart disease Paternal Uncle    Alcohol abuse Maternal Uncle    Alcohol abuse Maternal Grandfather    Social History Social History   Tobacco Use   Smoking status: Every Day    Packs/day: 0.50    Years: 51.00    Pack years: 25.50    Types: Cigarettes   Smokeless tobacco: Never  Substance Use Topics   Alcohol use: No    Comment: recovering alcoholic   Drug use: No   Allergies   Patient has no known allergies.  Review of Systems Review of Systems Pertinent findings noted in history of present illness.   Physical Exam Triage Vital Signs ED Triage Vitals  Enc Vitals Group     BP 10/09/21 0827 (!) 147/82     Pulse Rate 10/09/21 0827 72  Resp 10/09/21 0827 18     Temp 10/09/21 0827 98.3 F (36.8 C)     Temp Source 10/09/21 0827 Oral     SpO2 10/09/21 0827 98 %     Weight --      Height --      Head Circumference --      Peak Flow --      Pain Score 10/09/21 0826 5     Pain Loc --      Pain Edu? --      Excl. in [place]? --    No data found.  Updated Vital Signs BP 98/66 (BP Location: Left Arm)   Pulse 82   Temp 98.8 F (37.1 C) (Oral)   Resp 18   SpO2 94%   Visual Acuity Right Eye Distance:   Left Eye Distance:   Bilateral Distance:    Right Eye Near:   Left Eye Near:    Bilateral Near:     Physical Exam Vitals and nursing note reviewed.  Constitutional:      General: He is not in acute distress.    Appearance: Normal appearance. He is not ill-appearing.  HENT:     Head: Normocephalic and atraumatic.     Salivary Glands: Right salivary gland is not diffusely enlarged or tender. Left salivary gland is not diffusely enlarged or tender.     Right Ear: Tympanic membrane, ear canal and external ear normal. No drainage. No middle ear effusion. There is no impacted cerumen. Tympanic  membrane is not erythematous or bulging.     Left Ear: Tympanic membrane, ear canal and external ear normal. No drainage.  No middle ear effusion. There is no impacted cerumen. Tympanic membrane is not erythematous or bulging.     Nose: Nose normal. No nasal deformity, septal deviation, mucosal edema, congestion or rhinorrhea.     Right Turbinates: Not enlarged, swollen or pale.     Left Turbinates: Not enlarged, swollen or pale.     Right Sinus: No maxillary sinus tenderness or frontal sinus tenderness.     Left Sinus: No maxillary sinus tenderness or frontal sinus tenderness.     Mouth/Throat:     Lips: Pink. No lesions.     Mouth: Mucous membranes are moist. No oral lesions.     Pharynx: Oropharynx is clear. Uvula midline. No posterior oropharyngeal erythema or uvula swelling.     Tonsils: No tonsillar exudate. 0 on the right. 0 on the left.  Eyes:     General: Lids are normal.        Right eye: No discharge.        Left eye: No discharge.     Extraocular Movements: Extraocular movements intact.     Conjunctiva/sclera: Conjunctivae normal.     Right eye: Right conjunctiva is not injected.     Left eye: Left conjunctiva is not injected.  Neck:     Trachea: Trachea and phonation normal.  Cardiovascular:     Rate and Rhythm: Normal rate and regular rhythm.     Pulses: Normal pulses.     Heart sounds: Normal heart sounds. No murmur heard.   No friction rub. No gallop.  Pulmonary:     Effort: Pulmonary effort is normal. No accessory muscle usage, prolonged expiration or respiratory distress.     Breath sounds: No stridor, decreased air movement or transmitted upper airway sounds. Examination of the right-upper field reveals wheezing and rales. Examination of the left-upper field reveals wheezing and rales. Examination  of the right-middle field reveals wheezing and rales. Examination of the left-middle field reveals wheezing and rales. Examination of the right-lower field reveals wheezing  and rales. Examination of the left-lower field reveals wheezing and rales. Wheezing and rales present. No decreased breath sounds or rhonchi.  Chest:     Chest wall: No tenderness.  Musculoskeletal:        General: Normal range of motion.     Cervical back: Normal range of motion and neck supple. Normal range of motion.  Lymphadenopathy:     Cervical: Cervical adenopathy present.     Right cervical: Superficial cervical adenopathy, deep cervical adenopathy and posterior cervical adenopathy present.     Left cervical: Superficial cervical adenopathy, deep cervical adenopathy and posterior cervical adenopathy present.  Skin:    General: Skin is warm and dry.     Findings: No erythema or rash.  Neurological:     General: No focal deficit present.     Mental Status: He is alert and oriented to person, place, and time.  Psychiatric:        Mood and Affect: Mood normal.        Behavior: Behavior normal.   UC Treatments / Results  Labs (all labs ordered are listed, but only abnormal results are displayed)  Labs Reviewed  POCT INFLUENZA A/B - Abnormal; Notable for the following components:      Result Value   Influenza A, POC Positive (*)    All other components within normal limits    EKG  Radiology No results found.  Procedures Procedures (including critical care time)  Medications Ordered in UC Medications - No data to display  Initial Impression / Assessment and Plan / UC Course  I have reviewed the triage vital signs and the nursing notes.  Pertinent labs & imaging results that were available during my care of the patient were reviewed by me and considered in my medical decision making (see chart for details).      Patient has significant wheezing and rales on exam today throughout all lung fields.  Patient's oxygen is reduced is at 94% on arrival today.  Patient advised that he is welcome to continue albuterol nebs but I recommend that he begin an inhaled corticosteroid  along with dual antibiotics for presumed community-acquired pneumonia superimposed on top of influenza A via rapid influenza test performed today.  Patient also provided with a prescription for Tamiflu.  Patient advised to follow-up within the next 2 to 3 days with either his primary care provider or return to urgent care for repeat evaluation to ensure improvement.  Patient advised to go the emergency room for significantly worsening shortness of breath or altered mental status.  Final Clinical Impressions(s) / UC Diagnoses   Final diagnoses:  Acute cough  Nasal congestion  Acute nonintractable headache, unspecified headache type  Body aches  Influenza A  Acute exacerbation of chronic obstructive pulmonary disease (COPD) ([place])  Influenza with pneumonia     Discharge Instructions      You tested positive for influenza A during her visit today.  Because you are inside the 3-day window for treatment of influenza A, I recommend that you begin taking Tamiflu, 1 tablet twice daily for the next 5 days.  Because your wife is also having symptoms, please encourage her to reach out to her primary care provider to see if they recommend she begin taking Tamiflu as well.  According to CDC guidelines, members of the same household do not need  to be tested for influenza when another household member tests positive.  You have easily audible wheezing and rales on exam today, I am concerned that you may be developing pneumonia secondary to influenza.  To treat the wheezing, I recommend that you begin using a twice daily inhaler called Advair, this is a medication that contains a long-acting albuterol as well as a long-acting inhaled corticosteroid.  Please be sure you rinse your mouth out after each use to prevent thrush.  You can also continue using your sisters nebulized albuterol as needed throughout the day for coughing and wheezing, I have also provided you with a prescription for your own albuterol  inhaler that you can carry with you.  Please reach out to your primary care provider to discuss extending use of Advair for prevention of future episodes of pneumonia.  For prevention of fulminant bacterial pneumonia, recommend that you begin 2 antibiotics, Augmentin and doxycycline.  After reviewing recent guidelines, azithromycin is not indicated in patients with history of atrial fibrillation therefore doxycycline is her next best choice.  Please reach out to your primary care provider to let them know that you have been diagnosed with pneumonia and influenza and see if you can schedule an appointment for follow-up in the next 2 to 3 days.  It is important that you have close evaluation.  If you are unable to get in with your primary care provider, please feel free to come back to urgent care and I will be happy to reevaluate you myself.  Thank you for visiting urgent care this morning, I hope you feel better soon.     ED Prescriptions     Medication Sig Dispense Auth. Provider   oseltamivir (TAMIFLU) 75 MG capsule Take 1 capsule (75 mg total) by mouth 2 (two) times daily for 5 days. 10 capsule Lynden Oxford Scales, PA-C   amoxicillin-clavulanate (AUGMENTIN) 875-125 MG tablet Take 1 tablet by mouth every 12 (twelve) hours for 7 days. 14 tablet Lynden Oxford Scales, PA-C   doxycycline (VIBRAMYCIN) 100 MG capsule Take 1 capsule (100 mg total) by mouth 2 (two) times daily for 7 days. 14 capsule Lynden Oxford Scales, PA-C   fluticasone-salmeterol (ADVAIR DISKUS) 250-50 MCG/ACT AEPB Inhale 1 puff into the lungs in the morning and at bedtime. 60 each Lynden Oxford Scales, PA-C   Spacer/Aero-Holding Chambers (AEROCHAMBER PLUS FLO-VU LARGE) MISC 1 each by Other route once for 1 dose. 1 each Lynden Oxford Scales, PA-C   albuterol (VENTOLIN HFA) 108 (90 Base) MCG/ACT inhaler Inhale 1-2 puffs into the lungs every 6 (six) hours as needed for wheezing or shortness of breath. 18 g Lynden Oxford  Scales, PA-C      PDMP not reviewed this encounter.  Disposition Upon Discharge:  Patient presented with an acute illness with associated systemic symptoms and significant discomfort requiring urgent management. In my opinion, this is a condition that a prudent lay person (someone who possesses an average knowledge of health and medicine) may potentially expect to result in complications if not addressed urgently such as respiratory distress, impairment of bodily function or dysfunction of bodily organs.   Routine symptom specific, illness specific and/or disease specific instructions were discussed with the patient and/or caregiver at length.   As such, the patient has been evaluated and assessed, work-up was performed and treatment was provided in alignment with urgent care protocols and evidence based medicine.  Patient/parent/caregiver has been advised that the patient may require follow up for further testing and treatment if  the symptoms continue in spite of treatment, as clinically indicated and appropriate.  Patient was tested for COVID-19, Influenza and/or RSV, the patient/parent/guardian was advised to isolate at home pending the results of his/her diagnostic coronavirus test and potentially longer if they're positive. Advised pt that if his/her COVID-19 test returns positive, it's recommended to self-isolate for at least 10 days after symptoms first appeared AND until fever-free for 24 hours without fever reducer AND other symptoms have improved or resolved. Discussed self-isolation recommendations as well as instructions for household member/close contacts as per the [facility] and [place] DHHS, and also gave patient the [place] packet with this information.  Patient/parent/caregiver has been advised to return to the [facility] or PCP in 3-5 days if no better; to PCP or the Emergency Department if new signs and symptoms develop, or if the current signs or symptoms continue to change or worsen for further  workup, evaluation and treatment as clinically indicated and appropriate  The patient will follow up with their current PCP if and as advised. If the patient does not currently have a PCP we will assist them in obtaining one.   The patient may need specialty follow up if the symptoms continue, in spite of conservative treatment and management, for further workup, evaluation, consultation and treatment as clinically indicated and appropriate.  Patient/parent/caregiver verbalized understanding and agreement of plan as discussed.  All questions were addressed during visit.  Please see discharge instructions below for further details of plan.  Condition: stable for discharge home Home: take medications as prescribed; routine discharge instructions as discussed; follow up as advised.    Lynden Oxford Scales, PA-C 11/02/21 1337

## 2021-11-09 ENCOUNTER — Other Ambulatory Visit: Payer: Self-pay | Admitting: Cardiovascular Disease

## 2021-11-11 ENCOUNTER — Encounter: Payer: Self-pay | Admitting: Nurse Practitioner

## 2021-11-11 ENCOUNTER — Other Ambulatory Visit: Payer: Self-pay

## 2021-11-11 ENCOUNTER — Ambulatory Visit (INDEPENDENT_AMBULATORY_CARE_PROVIDER_SITE_OTHER): Payer: Medicare Other | Admitting: Nurse Practitioner

## 2021-11-11 VITALS — BP 104/69 | HR 80 | Temp 97.9°F | Ht 73.0 in | Wt 327.4 lb

## 2021-11-11 DIAGNOSIS — J189 Pneumonia, unspecified organism: Secondary | ICD-10-CM | POA: Diagnosis not present

## 2021-11-11 DIAGNOSIS — J410 Simple chronic bronchitis: Secondary | ICD-10-CM

## 2021-11-11 DIAGNOSIS — R0602 Shortness of breath: Secondary | ICD-10-CM

## 2021-11-11 DIAGNOSIS — Z6841 Body Mass Index (BMI) 40.0 and over, adult: Secondary | ICD-10-CM

## 2021-11-11 DIAGNOSIS — Z720 Tobacco use: Secondary | ICD-10-CM

## 2021-11-11 MED ORDER — AZITHROMYCIN 250 MG PO TABS: ORAL_TABLET | ORAL | 0 refills | Status: DC

## 2021-11-11 MED ORDER — METHYLPREDNISOLONE 4 MG PO TBPK: ORAL_TABLET | ORAL | 0 refills | Status: DC

## 2021-11-11 MED ORDER — FLUTICASONE-SALMETEROL 250-50 MCG/ACT IN AEPB: 1.0000 | INHALATION_SPRAY | Freq: Two times a day (BID) | RESPIRATORY_TRACT | 1 refills | Status: DC

## 2021-11-11 NOTE — Progress Notes (Signed)
Established Patient Office Visit  Subjective:  Patient ID: Brian Craig, male    DOB: 04/04/1951  Age: 70 y.o. MRN: [id number]  CC:  Chief Complaint  Patient presents with   Follow-up    HPI Brian Craig presents for follow up of urgent care. The patient was seen on 11/02/2021.  He was diagnosed with influenza A and pneumonia. He was put on tamiflu, doxycycline, and advair. He has finished taking the antibiotic and tamiflu. Continues to use the Advair twice daily. He still has headache, chest congestion, and productive cough. He has been out of work since 11/02/2021. He does need additional time to recover fully. He works for [employer]. He has started [facility] paperwork which will cover him being out of work from 11/02/2021 throuigh 11/23/2021. He would go back to work on 11/24/2021 as he works Tuesday through Saturday.   Past Medical History:  Diagnosis Date   Atrial fibrillation (HCC)    CHF (congestive heart failure) ([place])    Graves disease    Graves   Hyperlipidemia    Hypertension    OSA treated with BiPAP    Substance abuse ([place])    alcohol    Past Surgical History:  Procedure Laterality Date   APPENDECTOMY     BIV ICD GENERTAOR CHANGE OUT Left 2012   Medtronic Protecta   CARDIAC ELECTROPHYSIOLOGY STUDY AND ABLATION     EP IMPLANTABLE DEVICE N/A 05/06/2015   Procedure: ICD Generator Changeout;  Surgeon: Sanda Klein, MD;  Location: [facility];  Service: Cardiovascular;  Laterality: N/A;   HERNIA REPAIR     MENISCUS REPAIR Right    VASECTOMY     unilateral due to injury    Family History  Problem Relation Age of Onset   Heart disease Mother    Hypertension Mother    Heart disease Father    Hyperlipidemia Father    Hypertension Father    Heart attack Father    Heart disease Paternal Aunt    Heart disease Paternal Uncle    Alcohol abuse Maternal Uncle    Alcohol abuse Maternal Grandfather     Social History   Socioeconomic History   Marital status:  Married    Spouse name: Not on file   Number of children: Not on file   Years of education: Not on file   Highest education level: Not on file  Occupational History   Not on file  Tobacco Use   Smoking status: Every Day    Packs/day: 0.50    Years: 51.00    Pack years: 25.50    Types: Cigarettes   Smokeless tobacco: Never  Substance and Sexual Activity   Alcohol use: No    Comment: recovering alcoholic   Drug use: No   Sexual activity: Yes    Birth control/protection: None  Other Topics Concern   Not on file  Social History Narrative   Not on file   Social Determinants of Health   Financial Resource Strain: Not on file  Food Insecurity: Not on file  Transportation Needs: Not on file  Physical Activity: Not on file  Stress: Not on file  Social Connections: Not on file  Intimate Partner Violence: Not on file    Outpatient Medications Prior to Visit  Medication Sig Dispense Refill   acetaminophen (TYLENOL) 325 MG tablet Take 650 mg by mouth every 6 (six) hours as needed for headache.     albuterol (VENTOLIN HFA) 108 (90 Base) MCG/ACT inhaler Inhale 1-2  puffs into the lungs every 6 (six) hours as needed for wheezing or shortness of breath. 18 g 0   ALPRAZolam (XANAX) 0.5 MG tablet Take 1 tablet (0.5 mg total) by mouth 3 (three) times daily as needed for anxiety. 30 tablet 0   carvedilol (COREG CR) 80 MG 24 hr capsule Take 1 capsule (80 mg total) by mouth daily. 90 capsule 3   Cholecalciferol 50 MCG (2000 UT) CAPS Take 2,000 Units by mouth daily.     furosemide (LASIX) 40 MG tablet TAKE 1 TABLET ON SUNDAY AND MONDAY AND TAKE ONE-HALF (1/2) TABLET ON THE OTHER DAYS (SCHEDULE AN APPOINTMENT FOR FUTURE REFILLS) 135 tablet 0   levothyroxine (SYNTHROID, LEVOTHROID) 200 MCG tablet Take 200 mcg by mouth daily before breakfast.     lisinopril (ZESTRIL) 10 MG tablet TAKE 1 TABLET TWICE A DAY 180 tablet 3   Multiple Vitamin (MULTIVITAMIN) tablet Take 1 tablet by mouth daily.      potassium chloride (KLOR-CON) 10 MEQ tablet Take 1 tablet (10 mEq total) by mouth daily. 90 tablet 0   PRADAXA 150 MG CAPS capsule TAKE 1 CAPSULE TWICE DAILY 180 capsule 1   sertraline (ZOLOFT) 50 MG tablet Take 1 tablet (50 mg total) by mouth daily. 90 tablet 1   simvastatin (ZOCOR) 40 MG tablet Take 1 tablet (40 mg total) by mouth daily at 6 PM. 90 tablet 3   fluticasone-salmeterol (ADVAIR DISKUS) 250-50 MCG/ACT AEPB Inhale 1 puff into the lungs in the morning and at bedtime. 60 each 1   No facility-administered medications prior to visit.    No Known Allergies  ROS Review of Systems  Constitutional:  Positive for fatigue. Negative for activity change, chills and fever.  HENT:  Positive for congestion, postnasal drip and rhinorrhea. Negative for sinus pressure, sinus pain, sneezing and sore throat.   Eyes: Negative.   Respiratory:  Positive for cough, chest tightness, shortness of breath and wheezing.   Cardiovascular:  Negative for chest pain and palpitations.  Gastrointestinal:  Negative for constipation, diarrhea, nausea and vomiting.  Endocrine: Negative for cold intolerance, heat intolerance, polydipsia and polyuria.       History of hyperthyroid and graves' disease   Genitourinary:  Negative for dysuria, frequency and urgency.  Musculoskeletal:  Negative for back pain and myalgias.  Skin:  Negative for rash.  Allergic/Immunologic: Negative for environmental allergies.  Neurological:  Negative for dizziness, weakness and headaches.  Psychiatric/Behavioral:  The patient is not nervous/anxious.      Objective:    Physical Exam Vitals and nursing note reviewed.  Constitutional:      Appearance: Normal appearance. He is well-developed. He is obese. He is ill-appearing.  HENT:     Head: Normocephalic and atraumatic.     Nose: Congestion and rhinorrhea present.     Mouth/Throat:     Mouth: Mucous membranes are moist.     Pharynx: Oropharynx is clear.  Eyes:     Extraocular  Movements: Extraocular movements intact.     Conjunctiva/sclera: Conjunctivae normal.     Pupils: Pupils are equal, round, and reactive to light.  Cardiovascular:     Rate and Rhythm: Normal rate and regular rhythm.     Pulses: Normal pulses.     Heart sounds: Normal heart sounds.  Pulmonary:     Effort: Pulmonary effort is normal.     Breath sounds: Wheezing and rhonchi present.     Comments: Loose, congested, non-productive cough present during today's visit  Abdominal:  Palpations: Abdomen is soft.  Musculoskeletal:        General: Normal range of motion.     Cervical back: Normal range of motion and neck supple.  Lymphadenopathy:     Cervical: No cervical adenopathy.  Skin:    General: Skin is warm and dry.     Capillary Refill: Capillary refill takes less than 2 seconds.  Neurological:     General: No focal deficit present.     Mental Status: He is alert and oriented to person, place, and time.  Psychiatric:        Mood and Affect: Mood normal.        Behavior: Behavior normal.        Thought Content: Thought content normal.        Judgment: Judgment normal.    Today's Vitals   11/11/21 0858  BP: 104/69  Pulse: 80  Temp: 97.9 F (36.6 C)  SpO2: 98%  Weight: (!) 327 lb 6.4 oz (148.5 kg)  Height: 6' 1"  (1.854 m)   Body mass index is 43.2 kg/m.   Wt Readings from Last 3 Encounters:  11/11/21 (!) 327 lb 6.4 oz (148.5 kg)  09/07/21 (!) 331 lb 6 oz (150.3 kg)  05/18/21 (!) 337 lb 1.6 oz (152.9 kg)     Health Maintenance Due  Topic Date Due   TETANUS/TDAP  Never done   Zoster Vaccines- Shingrix (1 of 2) Never done   COLONOSCOPY (Pts 45-31yr Insurance coverage will need to be confirmed)  Never done   COVID-19 Vaccine (3 - Pfizer risk series) 03/18/2020   INFLUENZA VACCINE  Never done    There are no preventive care reminders to display for this patient.  Lab Results  Component Value Date   TSH 8.160 (H) 06/30/2021   Lab Results  Component Value Date    WBC 8.5 05/12/2021   HGB 15.1 05/12/2021   HCT 43.7 05/12/2021   MCV 95 05/12/2021   PLT 212 05/12/2021   Lab Results  Component Value Date   NA 146 (H) 05/12/2021   K 4.6 05/12/2021   CO2 25 05/12/2021   GLUCOSE 101 (H) 05/12/2021   BUN 13 05/12/2021   CREATININE 1.01 05/12/2021   BILITOT 0.5 05/12/2021   ALKPHOS 94 05/12/2021   AST 15 05/12/2021   ALT 12 05/12/2021   PROT 6.5 05/12/2021   ALBUMIN 3.9 05/12/2021   CALCIUM 9.4 05/12/2021   EGFR 81 05/12/2021   Lab Results  Component Value Date   CHOL 96 (L) 05/12/2021   Lab Results  Component Value Date   HDL 31 (L) 05/12/2021   Lab Results  Component Value Date   LDLCALC 49 05/12/2021   Lab Results  Component Value Date   TRIG 77 05/12/2021   Lab Results  Component Value Date   CHOLHDL 3.1 05/12/2021   Lab Results  Component Value Date   HGBA1C 5.8 (H) 05/12/2021      Assessment & Plan:  1. Community acquired pneumonia, unspecified laterality Reviewed visit notes from urgent care 11/02/2021. Patient did have influenza a and CAP. Finished doxycycline but still having symptoms. Add z-pack. Take as directed for 5 days. Rest and increase fluids. Continue using OTC medication to control symptoms.  Will complete forms for FMLA and return to patient when forms are ready.  - azithromycin (ZITHROMAX) 250 MG tablet; z-pack - take as directed for 5 days  Dispense: 6 tablet; Refill: 0  2. Shortness of breath Add medrol taper. Take as  directed for 6 days.  - methylPREDNISolone (MEDROL) 4 MG TBPK tablet; Take by mouth as directed for 6 days  Dispense: 21 tablet; Refill: 0  3. Simple chronic bronchitis (HCC) Use advair inhaler twice daily. Continue rescue inhaler as needed and as prescribed - fluticasone-salmeterol (ADVAIR DISKUS) 250-50 MCG/ACT AEPB; Inhale 1 puff into the lungs in the morning and at bedtime.  Dispense: 60 each; Refill: 1  4. Body mass index (BMI) of 40.1-44.9 in adult [facility]) Encourage patient to  limit calorie intake to 2000 cal/day or less.  He should consume a low cholesterol, low-fat diet.  Patient should incorporate exercise into his daily routine.   5. Tobacco abuse Encouraged patient to quit smoking.    Problem List Items Addressed This Visit       Respiratory   Community acquired pneumonia - Primary   Relevant Medications   azithromycin (ZITHROMAX) 250 MG tablet   fluticasone-salmeterol (ADVAIR DISKUS) 250-50 MCG/ACT AEPB   Simple chronic bronchitis (HCC)   Relevant Medications   fluticasone-salmeterol (ADVAIR DISKUS) 250-50 MCG/ACT AEPB     Other   Tobacco abuse   Body mass index (BMI) of 40.1-44.9 in adult (HCC)   Dyspnea   Relevant Medications   methylPREDNISolone (MEDROL) 4 MG TBPK tablet    Meds ordered this encounter  Medications   methylPREDNISolone (MEDROL) 4 MG TBPK tablet    Sig: Take by mouth as directed for 6 days    Dispense:  21 tablet    Refill:  0    Order Specific Question:   Supervising Provider    Answer:   Beatrice Lecher D [[id number]]   azithromycin (ZITHROMAX) 250 MG tablet    Sig: z-pack - take as directed for 5 days    Dispense:  6 tablet    Refill:  0    Order Specific Question:   Supervising Provider    Answer:   Beatrice Lecher D [[id number]]   fluticasone-salmeterol (ADVAIR DISKUS) 250-50 MCG/ACT AEPB    Sig: Inhale 1 puff into the lungs in the morning and at bedtime.    Dispense:  60 each    Refill:  1    Order Specific Question:   Supervising Provider    Answer:   Beatrice Lecher D [[id number]]     Follow-up: Return in about 4 months (around 03/11/2022) for blood pressure  - i think he normally sees maritza .    Ronnell Freshwater, NP

## 2021-11-11 NOTE — Patient Instructions (Signed)
Fat and Cholesterol Restricted Eating Plan Getting too much fat and cholesterol in your diet may cause health problems. Choosing the right foods helps keep your fat and cholesterol at normal levels. This can keep you from getting certain diseases. Your doctor may recommend an eating plan that includes: Total fat: ______% or less of total calories a day. This is ______g of fat a day. Saturated fat: ______% or less of total calories a day. This is ______g of saturated fat a day. Cholesterol: less than _________mg a day. Fiber: ______g a day. What are tips for following this plan? General tips Work with your doctor to lose weight if you need to. Avoid: Foods with added sugar. Fried foods. Foods with trans fat or partially hydrogenated oils. This includes some margarines and baked goods. If you drink alcohol: Limit how much you have to: 0-1 drink a day for women who are not pregnant. 0-2 drinks a day for men. Know how much alcohol is in a drink. In the [place] one drink equals one 12 oz bottle of beer (355 mL), one 5 oz glass of wine (148 mL), or one 1 oz glass of hard liquor (44 mL). Reading food labels Check food labels for: Trans fats. Partially hydrogenated oils. Saturated fat (g) in each serving. Cholesterol (mg) in each serving. Fiber (g) in each serving. Choose foods with healthy fats, such as: Monounsaturated fats and polyunsaturated fats. These include olive and canola oil, flaxseeds, walnuts, almonds, and seeds. Omega-3 fats. These are found in certain fish, flaxseed oil, and ground flaxseeds. Choose grain products that have whole grains. Look for the word "whole" as the first word in the ingredient list. Cooking Cook foods using low-fat methods. These include baking, boiling, grilling, and broiling. Eat more home-cooked foods. Eat at restaurants and buffets less often. Eat less fast food. Avoid cooking using saturated fats, such as butter, cream, palm oil, palm kernel oil, and  coconut oil. Meal planning  At meals, divide your plate into four equal parts: Fill one-half of your plate with vegetables, green salads, and fruit. Fill one-fourth of your plate with whole grains. Fill one-fourth of your plate with low-fat (lean) protein foods. Eat fish that is high in omega-3 fats at least two times a week. This includes mackerel, tuna, sardines, and salmon. Eat foods that are high in fiber, such as whole grains, beans, apples, pears, berries, broccoli, carrots, peas, and barley. What foods should I eat? Fruits All fresh, canned (in natural juice), or frozen fruits. Vegetables Fresh or frozen vegetables (raw, steamed, roasted, or grilled). Green salads. Grains Whole grains, such as whole wheat or whole grain breads, crackers, cereals, and pasta. Unsweetened oatmeal, bulgur, barley, quinoa, or brown rice. Corn or whole wheat flour tortillas. Meats and other protein foods Ground beef (85% or leaner), grass-fed beef, or beef trimmed of fat. Skinless chicken or turkey. Ground chicken or turkey. Pork trimmed of fat. All fish and seafood. Egg whites. Dried beans, peas, or lentils. Unsalted nuts or seeds. Unsalted canned beans. Nut butters without added sugar or oil. Dairy Low-fat or nonfat dairy products, such as skim or 1% milk, 2% or reduced-fat cheeses, low-fat and fat-free ricotta or cottage cheese, or plain low-fat and nonfat yogurt. Fats and oils Tub margarine without trans fats. Light or reduced-fat mayonnaise and salad dressings. Avocado. Olive, canola, sesame, or safflower oils. The items listed above may not be a complete list of foods and beverages you can eat. Contact a dietitian for more information. What foods   should I avoid? Fruits Canned fruit in heavy syrup. Fruit in cream or butter sauce. Fried fruit. Vegetables Vegetables cooked in cheese, cream, or butter sauce. Fried vegetables. Grains White bread. White pasta. White rice. Cornbread. Bagels, pastries,  and croissants. Crackers and snack foods that contain trans fat and hydrogenated oils. Meats and other protein foods Fatty cuts of meat. Ribs, chicken wings, bacon, sausage, bologna, salami, chitterlings, fatback, hot dogs, bratwurst, and packaged lunch meats. Liver and organ meats. Whole eggs and egg yolks. Chicken and turkey with skin. Fried meat. Dairy Whole or 2% milk, cream, half-and-half, and cream cheese. Whole milk cheeses. Whole-fat or sweetened yogurt. Full-fat cheeses. Nondairy creamers and whipped toppings. Processed cheese, cheese spreads, and cheese curds. Fats and oils Butter, stick margarine, lard, shortening, ghee, or bacon fat. Coconut, palm kernel, and palm oils. Beverages Alcohol. Sugar-sweetened drinks such as sodas, lemonade, and fruit drinks. Sweets and desserts Corn syrup, sugars, honey, and molasses. Candy. Jam and jelly. Syrup. Sweetened cereals. Cookies, pies, cakes, donuts, muffins, and ice cream. The items listed above may not be a complete list of foods and beverages you should avoid. Contact a dietitian for more information. Summary Choosing the right foods helps keep your fat and cholesterol at normal levels. This can keep you from getting certain diseases. At meals, fill one-half of your plate with vegetables, green salads, and fruits. Eat high fiber foods, like whole grains, beans, apples, pears, berries, carrots, peas, and barley. Limit added sugar, saturated fats, alcohol, and fried foods. This information is not intended to replace advice given to you by your health care provider. Make sure you discuss any questions you have with your health care provider. Document Revised: 04/10/2021 Document Reviewed: 04/10/2021 Elsevier Patient Education  2022 [employer] Inc.  

## 2021-11-14 ENCOUNTER — Other Ambulatory Visit: Payer: Self-pay | Admitting: Physician Assistant

## 2021-11-14 DIAGNOSIS — F419 Anxiety disorder, unspecified: Secondary | ICD-10-CM

## 2021-11-20 ENCOUNTER — Other Ambulatory Visit: Payer: Self-pay | Admitting: Cardiovascular Disease

## 2021-12-22 ENCOUNTER — Telehealth: Payer: Self-pay

## 2021-12-22 ENCOUNTER — Ambulatory Visit (INDEPENDENT_AMBULATORY_CARE_PROVIDER_SITE_OTHER): Payer: Medicare Other

## 2021-12-22 DIAGNOSIS — I442 Atrioventricular block, complete: Secondary | ICD-10-CM | POA: Diagnosis not present

## 2021-12-22 LAB — CUP PACEART REMOTE DEVICE CHECK
Battery Remaining Longevity: 7 mo
Battery Voltage: 2.84 V
Brady Statistic AP VP Percent: 0 %
Brady Statistic AP VS Percent: 0 %
Brady Statistic AS VP Percent: 99.5 %
Brady Statistic AS VS Percent: 0.5 %
Brady Statistic RA Percent Paced: 0 %
Brady Statistic RV Percent Paced: 99.22 %
Date Time Interrogation Session: 20230110033422
HighPow Impedance: 45 Ohm
HighPow Impedance: 61 Ohm
Implantable Lead Implant Date: 20071217
Implantable Lead Implant Date: 20071217
Implantable Lead Implant Date: 20071217
Implantable Lead Location: 753858
Implantable Lead Location: 753859
Implantable Lead Location: 753860
Implantable Lead Model: 4194
Implantable Lead Model: 5076
Implantable Lead Model: 6947
Implantable Pulse Generator Implant Date: 20160524
Lead Channel Impedance Value: 285 Ohm
Lead Channel Impedance Value: 285 Ohm
Lead Channel Impedance Value: 342 Ohm
Lead Channel Impedance Value: 418 Ohm
Lead Channel Impedance Value: 532 Ohm
Lead Channel Impedance Value: 703 Ohm
Lead Channel Pacing Threshold Amplitude: 0.75 V
Lead Channel Pacing Threshold Amplitude: 0.75 V
Lead Channel Pacing Threshold Pulse Width: 0.4 ms
Lead Channel Pacing Threshold Pulse Width: 0.8 ms
Lead Channel Sensing Intrinsic Amplitude: 4.375 mV
Lead Channel Sensing Intrinsic Amplitude: 4.375 mV
Lead Channel Setting Pacing Amplitude: 2 V
Lead Channel Setting Pacing Amplitude: 2.5 V
Lead Channel Setting Pacing Pulse Width: 0.4 ms
Lead Channel Setting Pacing Pulse Width: 0.8 ms
Lead Channel Setting Sensing Sensitivity: 0.3 mV

## 2021-12-22 NOTE — Telephone Encounter (Signed)
Increased ICD remotes to monthly d/t 7 months on ERI. Patient called and updated. Also updated in Epic.

## 2021-12-28 ENCOUNTER — Encounter: Payer: Self-pay | Admitting: Cardiovascular Disease

## 2021-12-28 ENCOUNTER — Ambulatory Visit (INDEPENDENT_AMBULATORY_CARE_PROVIDER_SITE_OTHER): Payer: Medicare Other | Admitting: Cardiovascular Disease

## 2021-12-28 ENCOUNTER — Other Ambulatory Visit: Payer: Self-pay

## 2021-12-28 VITALS — BP 110/64 | HR 83 | Ht 73.0 in | Wt 336.6 lb

## 2021-12-28 DIAGNOSIS — I5032 Chronic diastolic (congestive) heart failure: Secondary | ICD-10-CM

## 2021-12-28 DIAGNOSIS — D6869 Other thrombophilia: Secondary | ICD-10-CM | POA: Diagnosis not present

## 2021-12-28 DIAGNOSIS — I442 Atrioventricular block, complete: Secondary | ICD-10-CM | POA: Diagnosis not present

## 2021-12-28 DIAGNOSIS — E785 Hyperlipidemia, unspecified: Secondary | ICD-10-CM

## 2021-12-28 DIAGNOSIS — I4821 Permanent atrial fibrillation: Secondary | ICD-10-CM

## 2021-12-28 DIAGNOSIS — Z95810 Presence of automatic (implantable) cardiac defibrillator: Secondary | ICD-10-CM

## 2021-12-28 DIAGNOSIS — Z9989 Dependence on other enabling machines and devices: Secondary | ICD-10-CM | POA: Diagnosis not present

## 2021-12-28 DIAGNOSIS — R7303 Prediabetes: Secondary | ICD-10-CM | POA: Diagnosis not present

## 2021-12-28 DIAGNOSIS — G4733 Obstructive sleep apnea (adult) (pediatric): Secondary | ICD-10-CM

## 2021-12-28 NOTE — Patient Instructions (Addendum)
Medication Instructions:  No changes *If you need a refill on your cardiac medications before your next appointment, please call your pharmacy*   Lab Work: None ordered If you have labs (blood work) drawn today and your tests are completely normal, you will receive your results only by: [place] (if you have MyChart) OR A paper copy in the mail If you have any lab test that is abnormal or we need to change your treatment, we will call you to review the results.   Testing/Procedures: None ordered   Follow-Up: At [facility], you and your health needs are our priority.  As part of our continuing mission to provide you with exceptional heart care, we have created designated Provider Care Teams.  These Care Teams include your primary Cardiologist (physician) and Advanced Practice Providers (APPs -  Physician Assistants and Nurse Practitioners) who all work together to provide you with the care you need, when you need it.  We recommend signing up for the patient portal called "MyChart".  Sign up information is provided on this After Visit Summary.  MyChart is used to connect with patients for Virtual Visits (Telemedicine).  Patients are able to view lab/test results, encounter notes, upcoming appointments, etc.  Non-urgent messages can be sent to your provider as well.   To learn more about what you can do with MyChart, go to [email or web address].    Your next appointment:   06/07/22 at 10 am with Dr. Sallyanne Kuster

## 2021-12-28 NOTE — Progress Notes (Signed)
Patient ID: Brian Craig, male   DOB: 05-05-1951, 71 y.o.   MRN: [id number]    Cardiology Office Note    Date:  12/28/2021   ID:  Brian Craig, DOB [birth year], MRN [id number]  PCP:  Lorrene Reid, PA-C  Cardiologist:   Sanda Klein, MD   No chief complaint on file.   History of Present Illness:  Brian Craig is a 71 y.o. male who presents for permanent atrial fibrillation, nonischemic cardiomyopathy, complete heart block status post AV node ablation, biventricular defibrillator check.  For the most part Brian Craig is doing well.  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  At times when he has heart failure exacerbation and has to take higher doses of diuretics.  It is impossible for him to do his job since he has to urinate frequently.  He had an episode of flu in November that took him out of commission for a week or so.  Other than that he is doing quite well.  He denies orthopnea, PND, palpitations, dizziness, syncope or chest pain either at rest or with activity.  He remains morbidly obese and very sedentary.  Currently has no problems with edema.  Heart failure exacerbation is sometimes triggered by sodium indiscretion, but sometimes he cannot figure out exactly what causes volume overload.  He has not had any falls, injuries or bleeding problems.  He is compliant with anticoagulation with Pradaxa.  His most recent lipid profile showed an excellent LDL at 49 but also stubbornly low HDL at 31 and borderline hemoglobin A1c at 5.8%.  He is scheduled to see an endocrinologist in the near future.  Pacemaker interrogation shows permanent atrial fibrillation and complete heart block, 100% biventricular pacing.  The device has not recorded any episodes of rapid ventricular rates.  OptiVol has been within normal range over the last year.  In the past he had episodes of accelerated idioventricular rhythm and 2017 and 2018 but not since.  Estimated  battery longevity is 7 months and we are now doing his downloads on a monthly basis.   Brian Craig has a history of severe dilated cardiomyopathy related to uncontrolled atrial fibrillation with rapid ventricular response that started in the setting of thyrotoxicosis. His left ventricular ejection fraction was as low as 17%. Cardioversion failed. Eventually underwent AV node ablation and received a biventricular pacemaker-defibrillator in 2007. He underwent a generator change out in 2012 (atrial lead Medtronic 5076, ICD lead Medtronic [phone] Sprint Quattro secure, LV lead Medtronic 4194 Attain bipolar, Medtronic Viva XT gen change May 2016). His left ventricular ejection fraction has improved substantially since AV node ablation. His most recent echocardiogram performed in August 2015 shows normal left ventricular systolic function. He remains in permanent atrial fibrillation.     Past Medical History:  Diagnosis Date   Atrial fibrillation (HCC)    CHF (congestive heart failure) ([place])    Graves disease    Graves   Hyperlipidemia    Hypertension    OSA treated with BiPAP    Substance abuse ([place])    alcohol    Past Surgical History:  Procedure Laterality Date   APPENDECTOMY     BIV ICD GENERTAOR CHANGE OUT Left 2012   Medtronic Protecta   CARDIAC ELECTROPHYSIOLOGY STUDY AND ABLATION     EP IMPLANTABLE DEVICE N/A 05/06/2015   Procedure: ICD Generator Changeout;  Surgeon: Sanda Klein, MD;  Location: [facility];  Service: Cardiovascular;  Laterality: N/A;   HERNIA  REPAIR     MENISCUS REPAIR Right    VASECTOMY     unilateral due to injury    Outpatient Medications Prior to Visit  Medication Sig Dispense Refill   acetaminophen (TYLENOL) 325 MG tablet Take 650 mg by mouth every 6 (six) hours as needed for headache.     carvedilol (COREG CR) 80 MG 24 hr capsule Take 1 capsule (80 mg total) by mouth daily. 90 capsule 3   Cholecalciferol 50 MCG (2000 UT) CAPS Take 2,000 Units by mouth  daily.     furosemide (LASIX) 40 MG tablet TAKE 1 TABLET ON SUNDAY AND MONDAY AND TAKE ONE-HALF (1/2) TABLET ON THE OTHER DAYS (SCHEDULE AN APPOINTMENT FOR FUTURE REFILLS) 135 tablet 0   levothyroxine (SYNTHROID, LEVOTHROID) 200 MCG tablet Take 200 mcg by mouth daily before breakfast.     lisinopril (ZESTRIL) 10 MG tablet TAKE 1 TABLET TWICE A DAY 180 tablet 3   Multiple Vitamin (MULTIVITAMIN) tablet Take 1 tablet by mouth daily.     potassium chloride (KLOR-CON) 10 MEQ tablet TAKE 1 TABLET DAILY 90 tablet 0   PRADAXA 150 MG CAPS capsule TAKE 1 CAPSULE TWICE DAILY 180 capsule 1   sertraline (ZOLOFT) 50 MG tablet TAKE 1 TABLET DAILY 90 tablet 0   simvastatin (ZOCOR) 40 MG tablet Take 1 tablet (40 mg total) by mouth daily at 6 PM. 90 tablet 3   albuterol (VENTOLIN HFA) 108 (90 Base) MCG/ACT inhaler Inhale 1-2 puffs into the lungs every 6 (six) hours as needed for wheezing or shortness of breath. (Patient not taking: Reported on 12/28/2021) 18 g 0   ALPRAZolam (XANAX) 0.5 MG tablet Take 1 tablet (0.5 mg total) by mouth 3 (three) times daily as needed for anxiety. (Patient not taking: Reported on 12/28/2021) 30 tablet 0   azithromycin (ZITHROMAX) 250 MG tablet z-pack - take as directed for 5 days (Patient not taking: Reported on 12/28/2021) 6 tablet 0   fluticasone-salmeterol (ADVAIR DISKUS) 250-50 MCG/ACT AEPB Inhale 1 puff into the lungs in the morning and at bedtime. (Patient not taking: Reported on 12/28/2021) 60 each 1   methylPREDNISolone (MEDROL) 4 MG TBPK tablet Take by mouth as directed for 6 days (Patient not taking: Reported on 12/28/2021) 21 tablet 0   No facility-administered medications prior to visit.     Allergies:   Patient has no known allergies.   Social History   Socioeconomic History   Marital status: Married    Spouse name: Not on file   Number of children: Not on file   Years of education: Not on file   Highest education level: Not on file  Occupational History   Not on  file  Tobacco Use   Smoking status: Every Day    Packs/day: 0.50    Years: 51.00    Pack years: 25.50    Types: Cigarettes   Smokeless tobacco: Never   Tobacco comments:    Half of pack daily  Substance and Sexual Activity   Alcohol use: No    Comment: recovering alcoholic   Drug use: No   Sexual activity: Yes    Birth control/protection: None  Other Topics Concern   Not on file  Social History Narrative   Not on file   Social Determinants of Health   Financial Resource Strain: Not on file  Food Insecurity: Not on file  Transportation Needs: Not on file  Physical Activity: Not on file  Stress: Not on file  Social Connections: Not on file  Family History:  The patient's family history includes Alcohol abuse in his maternal grandfather and maternal uncle; Heart attack in his father; Heart disease in his father, mother, paternal aunt, and paternal uncle; Hyperlipidemia in his father; Hypertension in his father and mother.   ROS:   Please see the history of present illness.    ROS All other systems reviewed and are negative.   PHYSICAL EXAM:   VS:  BP 110/64 (BP Location: Left Arm, Patient Position: Sitting, Cuff Size: Large)    Pulse 83    Ht 6\' 1"  (1.854 m)    Wt (!) 336 lb 9.6 oz (152.7 kg)    SpO2 97%    BMI 44.41 kg/m      General: Alert, oriented x3, no distress, morbid obesity limits the physical exam.  Healthy left subclavian defibrillator site. Head: no evidence of trauma, PERRL, EOMI, no exophtalmos or lid lag, no myxedema, no xanthelasma; normal ears, nose and oropharynx Neck: normal jugular venous pulsations and no hepatojugular reflux; brisk carotid pulses without delay and no carotid bruits Chest: clear to auscultation, no signs of consolidation by percussion or palpation, normal fremitus, symmetrical and full respiratory excursions Cardiovascular: normal position and quality of the apical impulse, regular rhythm, normal first and second heart sounds, no  murmurs, rubs or gallops Abdomen: no tenderness or distention, no masses by palpation, no abnormal pulsatility or arterial bruits, normal bowel sounds, no hepatosplenomegaly Extremities: no clubbing, cyanosis or edema; 2+ radial, ulnar and brachial pulses bilaterally; 2+ right femoral, posterior tibial and dorsalis pedis pulses; 2+ left femoral, posterior tibial and dorsalis pedis pulses; no subclavian or femoral bruits Neurological: grossly nonfocal Psych: Normal mood and affect  Wt Readings from Last 3 Encounters:  12/28/21 (!) 336 lb 9.6 oz (152.7 kg)  11/11/21 (!) 327 lb 6.4 oz (148.5 kg)  09/07/21 (!) 331 lb 6 oz (150.3 kg)      Studies/Labs Reviewed:   EKG:  EKG is ordered today.  Patient has background atrial fibrillation in the 100% ventricular paced rhythm with a distinct positive R wave in lead V1.  The QRS is brought out and 56 ms.  QTc 519 ms.  Recent Labs: 02/02/2021: BNP 108.4 05/12/2021: ALT 12; BUN 13; Creatinine, Ser 1.01; Hemoglobin 15.1; Platelets 212; Potassium 4.6; Sodium 146 06/30/2021: TSH 8.160   Lipid Panel    Component Value Date/Time   CHOL 96 (L) 05/12/2021 0833   TRIG 77 05/12/2021 0833   HDL 31 (L) 05/12/2021 0833   CHOLHDL 3.1 05/12/2021 0833   CHOLHDL 3.6 02/09/2017 1103   VLDL 25 02/09/2017 1103   LDLCALC 49 05/12/2021 0833      ASSESSMENT:    1. CHB (complete heart block) ([place])   2. Permanent atrial fibrillation ([place])   3. Acquired thrombophilia ([place])   4. Biventricular automatic implantable cardioverter defibrillator in situ   5. Chronic diastolic heart failure ([place])   6. OSA on CPAP   7. Morbid obesity ([place])   8. Prediabetes   9. Dyslipidemia (high LDL; low HDL)       PLAN:  In order of problems listed above:  1. CHB: No underlying escape rhythm.  Pacemaker dependent following AV node ablation. 2. AFib: Permanent longstanding arrhythmia.. No history of stroke or other embolic events. CHADSvasc 2 (age, CHF).   3. Anticoagulation:  No falls no bleeding complications.  Recent hemoglobin 15.1. 4. CRT-D: Normal device function, within roughly 7 months of battery ERI.  Monthly downloads for battery voltage. 5. CHF:  History of depressed LVEF, now fully recovered.  On ACE inhibitor and carvedilol.  Unlikely to tolerate higher doses since his blood pressure is in the low normal range.  Requires a relatively low dose of loop diuretics, but has occasional heart failure exacerbation spells when he has to take more. 6. OSA on CPAP: Denies daytime hypersomnolence and reports 100% compliance with CPAP. 7. Obesity/prediabetes: He needs to start medications for his diabetes, would recommend SGLT2 inhibitor to help with heart failure and/or a GLP-1 agonist to assist with weight loss. 8.  Dyslipidemia: Typical pattern for insulin resistance.  HDL will not improve without substantial weight loss. 9. Tobacco abuse: Smokes a very small number of cigarettes, about a pack a month.  He does not want to quit. 10. Hypothyroidism, postradioiodine therapy: TSH was a little high at 8.16 in July 2022.  He is on a relatively high dose of Synthroid, make sure he takes it without food.  Has a follow-up with his endocrinologist coming up.  We 11. Alcoholism in recovery: Has been abstinent for many years.   Medication Adjustments/Labs and Tests Ordered: Current medicines are reviewed at length with the patient today.  Concerns regarding medicines are outlined above.  Medication changes, Labs and Tests ordered today are listed in the Patient Instructions below. Patient Instructions  Medication Instructions:  No changes *If you need a refill on your cardiac medications before your next appointment, please call your pharmacy*   Lab Work: None ordered If you have labs (blood work) drawn today and your tests are completely normal, you will receive your results only by: [place] (if you have MyChart) OR A paper copy in the mail If you have any lab test  that is abnormal or we need to change your treatment, we will call you to review the results.   Testing/Procedures: None ordered   Follow-Up: At [facility], you and your health needs are our priority.  As part of our continuing mission to provide you with exceptional heart care, we have created designated Provider Care Teams.  These Care Teams include your primary Cardiologist (physician) and Advanced Practice Providers (APPs -  Physician Assistants and Nurse Practitioners) who all work together to provide you with the care you need, when you need it.  We recommend signing up for the patient portal called "MyChart".  Sign up information is provided on this After Visit Summary.  MyChart is used to connect with patients for Virtual Visits (Telemedicine).  Patients are able to view lab/test results, encounter notes, upcoming appointments, etc.  Non-urgent messages can be sent to your provider as well.   To learn more about what you can do with MyChart, go to [email or web address].    Your next appointment:   06/07/22 at 10 am with Dr. Sallyanne Kuster       Signed, Sanda Klein, MD  12/28/2021 3:33 PM    [address], [place], [place]  [address] Phone: [phone]; Fax: [phone]

## 2021-12-30 ENCOUNTER — Other Ambulatory Visit: Payer: Self-pay | Admitting: Cardiovascular Disease

## 2021-12-31 NOTE — Progress Notes (Signed)
Remote ICD transmission.   

## 2022-01-18 DIAGNOSIS — E039 Hypothyroidism, unspecified: Secondary | ICD-10-CM | POA: Diagnosis not present

## 2022-01-18 DIAGNOSIS — Z6841 Body Mass Index (BMI) 40.0 and over, adult: Secondary | ICD-10-CM | POA: Diagnosis not present

## 2022-01-22 ENCOUNTER — Ambulatory Visit (INDEPENDENT_AMBULATORY_CARE_PROVIDER_SITE_OTHER): Payer: Medicare Other

## 2022-01-22 DIAGNOSIS — I442 Atrioventricular block, complete: Secondary | ICD-10-CM

## 2022-01-23 LAB — CUP PACEART REMOTE DEVICE CHECK
Battery Remaining Longevity: 6 mo
Battery Voltage: 2.83 V
Brady Statistic AP VP Percent: 0 %
Brady Statistic AP VS Percent: 0 %
Brady Statistic AS VP Percent: 98.76 %
Brady Statistic AS VS Percent: 1.24 %
Brady Statistic RA Percent Paced: 0 %
Brady Statistic RV Percent Paced: 98.61 %
Date Time Interrogation Session: 20230210042507
HighPow Impedance: 45 Ohm
HighPow Impedance: 61 Ohm
Implantable Lead Implant Date: 20071217
Implantable Lead Implant Date: 20071217
Implantable Lead Implant Date: 20071217
Implantable Lead Location: 753858
Implantable Lead Location: 753859
Implantable Lead Location: 753860
Implantable Lead Model: 4194
Implantable Lead Model: 5076
Implantable Lead Model: 6947
Implantable Pulse Generator Implant Date: 20160524
Lead Channel Impedance Value: 247 Ohm
Lead Channel Impedance Value: 285 Ohm
Lead Channel Impedance Value: 304 Ohm
Lead Channel Impedance Value: 418 Ohm
Lead Channel Impedance Value: 532 Ohm
Lead Channel Impedance Value: 665 Ohm
Lead Channel Pacing Threshold Amplitude: 0.75 V
Lead Channel Pacing Threshold Amplitude: 0.75 V
Lead Channel Pacing Threshold Pulse Width: 0.4 ms
Lead Channel Pacing Threshold Pulse Width: 0.8 ms
Lead Channel Sensing Intrinsic Amplitude: 4.375 mV
Lead Channel Sensing Intrinsic Amplitude: 4.375 mV
Lead Channel Setting Pacing Amplitude: 2 V
Lead Channel Setting Pacing Amplitude: 2.5 V
Lead Channel Setting Pacing Pulse Width: 0.4 ms
Lead Channel Setting Pacing Pulse Width: 0.8 ms
Lead Channel Setting Sensing Sensitivity: 0.3 mV

## 2022-01-26 NOTE — Addendum Note (Signed)
Addended by: Cheri Kearns A on: 01/26/2022 03:42 PM   Modules accepted: Level of Service

## 2022-01-26 NOTE — Progress Notes (Signed)
Remote ICD transmission.   

## 2022-01-27 ENCOUNTER — Other Ambulatory Visit: Payer: Self-pay | Admitting: Physician Assistant

## 2022-01-27 DIAGNOSIS — F419 Anxiety disorder, unspecified: Secondary | ICD-10-CM

## 2022-02-18 ENCOUNTER — Other Ambulatory Visit: Payer: Self-pay | Admitting: Cardiovascular Disease

## 2022-02-22 ENCOUNTER — Ambulatory Visit (INDEPENDENT_AMBULATORY_CARE_PROVIDER_SITE_OTHER): Payer: Medicare Other

## 2022-02-22 DIAGNOSIS — I442 Atrioventricular block, complete: Secondary | ICD-10-CM

## 2022-02-22 LAB — CUP PACEART REMOTE DEVICE CHECK
Battery Remaining Longevity: 5 mo
Battery Voltage: 2.82 V
Brady Statistic AP VP Percent: 0 %
Brady Statistic AP VS Percent: 0 %
Brady Statistic AS VP Percent: 98.66 %
Brady Statistic AS VS Percent: 1.34 %
Brady Statistic RA Percent Paced: 0 %
Brady Statistic RV Percent Paced: 97.63 %
Date Time Interrogation Session: 20230313022824
HighPow Impedance: 43 Ohm
HighPow Impedance: 62 Ohm
Implantable Lead Implant Date: 20071217
Implantable Lead Implant Date: 20071217
Implantable Lead Implant Date: 20071217
Implantable Lead Location: 753858
Implantable Lead Location: 753859
Implantable Lead Location: 753860
Implantable Lead Model: 4194
Implantable Lead Model: 5076
Implantable Lead Model: 6947
Implantable Pulse Generator Implant Date: 20160524
Lead Channel Impedance Value: 228 Ohm
Lead Channel Impedance Value: 361 Ohm
Lead Channel Impedance Value: 399 Ohm
Lead Channel Impedance Value: 399 Ohm
Lead Channel Impedance Value: 475 Ohm
Lead Channel Impedance Value: 589 Ohm
Lead Channel Pacing Threshold Amplitude: 0.75 V
Lead Channel Pacing Threshold Amplitude: 1 V
Lead Channel Pacing Threshold Pulse Width: 0.4 ms
Lead Channel Pacing Threshold Pulse Width: 0.8 ms
Lead Channel Sensing Intrinsic Amplitude: 4.375 mV
Lead Channel Sensing Intrinsic Amplitude: 4.375 mV
Lead Channel Setting Pacing Amplitude: 2.25 V
Lead Channel Setting Pacing Amplitude: 2.5 V
Lead Channel Setting Pacing Pulse Width: 0.4 ms
Lead Channel Setting Pacing Pulse Width: 0.8 ms
Lead Channel Setting Sensing Sensitivity: 0.3 mV

## 2022-03-05 NOTE — Progress Notes (Signed)
Remote ICD transmission.   

## 2022-03-08 ENCOUNTER — Ambulatory Visit: Payer: Medicare Other | Admitting: Physician Assistant

## 2022-03-15 ENCOUNTER — Encounter: Payer: Self-pay | Admitting: Physician Assistant

## 2022-03-15 ENCOUNTER — Ambulatory Visit (INDEPENDENT_AMBULATORY_CARE_PROVIDER_SITE_OTHER): Payer: Medicare Other | Admitting: Physician Assistant

## 2022-03-15 VITALS — BP 107/71 | HR 68 | Temp 97.4°F | Ht 73.0 in | Wt 338.0 lb

## 2022-03-15 DIAGNOSIS — Z130 Encounter for screening for diseases of the blood and blood-forming organs and certain disorders involving the immune mechanism: Secondary | ICD-10-CM

## 2022-03-15 DIAGNOSIS — I442 Atrioventricular block, complete: Secondary | ICD-10-CM | POA: Diagnosis not present

## 2022-03-15 DIAGNOSIS — I10 Essential (primary) hypertension: Secondary | ICD-10-CM | POA: Diagnosis not present

## 2022-03-15 DIAGNOSIS — E559 Vitamin D deficiency, unspecified: Secondary | ICD-10-CM

## 2022-03-15 DIAGNOSIS — E890 Postprocedural hypothyroidism: Secondary | ICD-10-CM

## 2022-03-15 DIAGNOSIS — D229 Melanocytic nevi, unspecified: Secondary | ICD-10-CM | POA: Diagnosis not present

## 2022-03-15 DIAGNOSIS — Z13228 Encounter for screening for other metabolic disorders: Secondary | ICD-10-CM | POA: Diagnosis not present

## 2022-03-15 DIAGNOSIS — Z1321 Encounter for screening for nutritional disorder: Secondary | ICD-10-CM

## 2022-03-15 DIAGNOSIS — R7303 Prediabetes: Secondary | ICD-10-CM | POA: Diagnosis not present

## 2022-03-15 DIAGNOSIS — Z1329 Encounter for screening for other suspected endocrine disorder: Secondary | ICD-10-CM

## 2022-03-15 DIAGNOSIS — F419 Anxiety disorder, unspecified: Secondary | ICD-10-CM

## 2022-03-15 DIAGNOSIS — E785 Hyperlipidemia, unspecified: Secondary | ICD-10-CM

## 2022-03-15 MED ORDER — ALPRAZOLAM 0.5 MG PO TABS: 0.5000 mg | ORAL_TABLET | Freq: Three times a day (TID) | ORAL | 0 refills | Status: DC | PRN

## 2022-03-15 NOTE — Assessment & Plan Note (Signed)
-  Stable. ?-PHQ-9 score of 1, GAD-7 score of 0. ?-Continue sertraline 50 mg daily.  ?-Continue alprazolam 0.5 mg as needed for severe anxiety, PDMP reviewed, no aberrancies, rx last filled 05/18/21. Controlled substance contract updated today and new rx sent. ?Will continue to monitor. ?

## 2022-03-15 NOTE — Assessment & Plan Note (Signed)
-  Followed by Endocrinology. ?-Reviewed consult note 01/18/22. Levothyroxine adjusted. Taking Levothyroxine 200 mg 1 tablet daily and 1.5 tablet on Sundays only.  ?

## 2022-03-15 NOTE — Assessment & Plan Note (Signed)
>>  ASSESSMENT AND PLAN FOR HYPOTHYROIDISM, POSTRADIOIODINE THERAPY WRITTEN ON 03/15/2022 10:06 AM BY ABONZA, MARITZA, PA-C  -Followed by Endocrinology. -Reviewed consult note 01/18/22. Levothyroxine adjusted. Taking Levothyroxine 200 mg 1 tablet daily and 1.5 tablet on Sundays only.

## 2022-03-15 NOTE — Assessment & Plan Note (Signed)
-  Followed by cardiology. ?-S/p ablation, has pacemaker. ?

## 2022-03-15 NOTE — Progress Notes (Signed)
?Established patient visit ? ? ?Patient: Brian Craig   DOB: 1951/11/15   71 y.o. Male  MRN: [id number] ?Visit Date: 03/15/2022 ? ?Chief Complaint  ?Patient presents with  ? Follow-up  ? Hypertension  ? ?Subjective  ?  ?HPI  ?Patient presents for follow up on mood, hypertension and hyperlipidemia. Patient requesting referral to dermatology for skin check and few lesions.  ? ?Mood: Reports mood has been stable. Denies labile mood, severe anxiety or SI/HI. Reports medication compliance.  ? ?HTN: Pt denies chest pain, palpitations, dizziness, worsening shortness of breath from baseline, or lower extremity swelling. Taking medication as directed without side effects. Continues to monitor sodium intake.  ? ?HLD: Pt taking medication as directed without issues, denies muscle weakness or myalgias. Trying to follow a balanced diet.   ? ?Thyroid: Recently saw his endocrinologist and thyroid medication was adjusted.  ? ? ?Medications: ?Outpatient Medications Prior to Visit  ?Medication Sig  ? acetaminophen (TYLENOL) 325 MG tablet Take 650 mg by mouth every 6 (six) hours as needed for headache.  ? carvedilol (COREG CR) 80 MG 24 hr capsule Take 1 capsule (80 mg total) by mouth daily.  ? Cholecalciferol 50 MCG (2000 UT) CAPS Take 2,000 Units by mouth daily.  ? furosemide (LASIX) 40 MG tablet TAKE 1 TABLET ON SUNDAY AND MONDAY AND TAKE ONE-HALF (1/2) TABLET ON THE OTHER DAYS (SCHEDULE AN APPOINTMENT FOR FUTURE REFILLS)  ? levothyroxine (SYNTHROID, LEVOTHROID) 200 MCG tablet Take 200 mcg by mouth daily before breakfast.  ? lisinopril (ZESTRIL) 10 MG tablet TAKE 1 TABLET TWICE A DAY  ? Multiple Vitamin (MULTIVITAMIN) tablet Take 1 tablet by mouth daily.  ? potassium chloride (KLOR-CON) 10 MEQ tablet TAKE 1 TABLET DAILY  ? PRADAXA 150 MG CAPS capsule TAKE 1 CAPSULE TWICE DAILY  ? sertraline (ZOLOFT) 50 MG tablet TAKE 1 TABLET DAILY  ? simvastatin (ZOCOR) 40 MG tablet Take 1 tablet (40 mg total) by mouth daily at 6 PM.  ?  fluticasone-salmeterol (ADVAIR DISKUS) 250-50 MCG/ACT AEPB Inhale 1 puff into the lungs in the morning and at bedtime. (Patient not taking: Reported on 12/28/2021)  ? ?No facility-administered medications prior to visit.  ? ? ?Review of Systems ?Review of Systems:  ?A fourteen system review of systems was performed and found to be positive as per HPI. ? ? ?  Objective  ?  ?BP 107/71   Pulse 68   Temp (!) 97.4 ?F (36.3 ?C)   Ht '6\' 1"'$  (1.854 m)   Wt (!) 338 lb (153.3 kg)   SpO2 98%   BMI 44.59 kg/m?  ?BP Readings from Last 3 Encounters:  ?03/15/22 107/71  ?12/28/21 110/64  ?11/11/21 104/69  ? ?Wt Readings from Last 3 Encounters:  ?03/15/22 (!) 338 lb (153.3 kg)  ?12/28/21 (!) 336 lb 9.6 oz (152.7 kg)  ?11/11/21 (!) 327 lb 6.4 oz (148.5 kg)  ? ? ?Physical Exam  ?General:  Well Developed, well nourished, appropriate for stated age.  ?Neuro:  Alert and oriented,  extra-ocular muscles intact  ?HEENT:  Normocephalic, atraumatic, neck supple  ?Skin: abnormal pigmented moles (scattered), scaly papules  ?Cardiac:  RRR ?Respiratory: CTA B/L  ?Vascular:  Ext warm, no cyanosis apprec.; cap RF less 2 sec. ?Psych:  No HI/SI, judgement and insight good, Euthymic mood. Full Affect. ? ? ?No results found for any visits on 03/15/22. ? Assessment & Plan  ?  ? ? ?  03/15/2022  ?  9:41 AM 11/11/2021  ?  9:01 AM 05/18/2021  ?  1:40 PM 01/12/2021  ?  2:33 PM 01/12/2021  ?  2:29 PM  ?Depression screen PHQ 2/9  ?Decreased Interest 0 0 0 0 0  ?Down, Depressed, Hopeless 0 0 0 0 0  ?PHQ - 2 Score 0 0 0 0 0  ?Altered sleeping 0 0 0 0 0  ?Tired, decreased energy 0 1 0 0 0  ?Change in appetite 1 0 0 0 0  ?Feeling bad or failure about yourself  0 0 0 0   ?Trouble concentrating 0 0 0 0   ?Moving slowly or fidgety/restless 0 0 0 0   ?Suicidal thoughts 0 0 0 0   ?PHQ-9 Score 1 1 0 0 0  ?Difficult doing work/chores Not difficult at all      ? ? ?  03/15/2022  ?  9:41 AM 11/11/2021  ?  9:01 AM  ?GAD 7 : Generalized Anxiety Score  ?Nervous, Anxious, on Edge  0 0  ?Control/stop worrying 0 0  ?Worry too much - different things 0 0  ?Trouble relaxing 0 0  ?Restless 0 0  ?Easily annoyed or irritable 0 0  ?Afraid - awful might happen 0 0  ?Total GAD 7 Score 0 0  ?Anxiety Difficulty Not difficult at all   ? ? ? ?Problem List Items Addressed This Visit   ? ?  ? Cardiovascular and Mediastinum  ? CHB (complete heart block) (HCC)  ?  -Followed by cardiology. ?-S/p ablation, has pacemaker. ?  ?  ? Essential hypertension  ?  -Controlled. ?-Continue current medication regimen. ?-Will collect CMP for medication monitoring. ?-Will continue to monitor. ?  ?  ?  ? Endocrine  ? Hypothyroidism, postradioiodine therapy  ?  -Followed by Endocrinology. ?-Reviewed consult note 01/18/22. Levothyroxine adjusted. Taking Levothyroxine 200 mg 1 tablet daily and 1.5 tablet on Sundays only.  ?  ?  ?  ? Other  ? Vitamin D deficiency  ? Relevant Orders  ? VITAMIN D 25 Hydroxy (Vit-D Deficiency, Fractures)  ? Anxiety - Primary  ?  -Stable. ?-PHQ-9 score of 1, GAD-7 score of 0. ?-Continue sertraline 50 mg daily.  ?-Continue alprazolam 0.5 mg as needed for severe anxiety, PDMP reviewed, no aberrancies, rx last filled 05/18/21. Controlled substance contract updated today and new rx sent. ?Will continue to monitor. ?  ?  ? Relevant Medications  ? ALPRAZolam (XANAX) 0.5 MG tablet  ? ?Other Visit Diagnoses   ? ? Hyperlipidemia, unspecified hyperlipidemia type      ? Relevant Orders  ? CBC with Differential/Platelet  ? Comprehensive metabolic panel  ? Lipid panel  ? Prediabetes      ? Relevant Orders  ? CBC with Differential/Platelet  ? Hemoglobin A1c  ? Screening for endocrine, nutritional, metabolic and immunity disorder      ? Relevant Orders  ? CBC with Differential/Platelet  ? Comprehensive metabolic panel  ? Hemoglobin A1c  ? Lipid panel  ? VITAMIN D 25 Hydroxy (Vit-D Deficiency, Fractures)  ? Multiple atypical nevi      ? Relevant Orders  ? Ambulatory referral to Dermatology  ? ?   ? ?Hyperlipidemia: ?-Last lipid panel, HDL 31 LDL 49 (at goal). ?-Continue current medication regimen. ?-Will repeat lipid panel and hepatic function. ?-Will continue to monitor. ? ?Return in about 4 months (around 07/15/2022) for [facility] (ok to schedule with [facility]).  ?   ? ? ? ?Lorrene Reid, PA-C  ?[facility] ?[phone] (phone) ?[phone] (fax) ? ?[facility] ?

## 2022-03-15 NOTE — Assessment & Plan Note (Signed)
-  Controlled. -Continue current medication regimen. -Will collect CMP for medication monitoring. -Will continue to monitor. 

## 2022-03-16 LAB — CBC WITH DIFFERENTIAL/PLATELET
Basophils Absolute: 0.1 10*3/uL (ref 0.0–0.2)
Basos: 1 %
EOS (ABSOLUTE): 0.2 10*3/uL (ref 0.0–0.4)
Eos: 2 %
Hematocrit: 45.6 % (ref 37.5–51.0)
Hemoglobin: 15.4 g/dL (ref 13.0–17.7)
Immature Grans (Abs): 0 10*3/uL (ref 0.0–0.1)
Immature Granulocytes: 0 %
Lymphocytes Absolute: 2.4 10*3/uL (ref 0.7–3.1)
Lymphs: 29 %
MCH: 32.6 pg (ref 26.6–33.0)
MCHC: 33.8 g/dL (ref 31.5–35.7)
MCV: 96 fL (ref 79–97)
Monocytes Absolute: 0.9 10*3/uL (ref 0.1–0.9)
Monocytes: 10 %
Neutrophils Absolute: 5 10*3/uL (ref 1.4–7.0)
Neutrophils: 58 %
Platelets: 230 10*3/uL (ref 150–450)
RBC: 4.73 x10E6/uL (ref 4.14–5.80)
RDW: 11.8 % (ref 11.6–15.4)
WBC: 8.5 10*3/uL (ref 3.4–10.8)

## 2022-03-16 LAB — COMPREHENSIVE METABOLIC PANEL
ALT: 13 IU/L (ref 0–44)
AST: 17 IU/L (ref 0–40)
Albumin/Globulin Ratio: 1.7 (ref 1.2–2.2)
Albumin: 4.2 g/dL (ref 3.8–4.8)
Alkaline Phosphatase: 94 IU/L (ref 44–121)
BUN/Creatinine Ratio: 12 (ref 10–24)
BUN: 12 mg/dL (ref 8–27)
Bilirubin Total: 0.6 mg/dL (ref 0.0–1.2)
CO2: 27 mmol/L (ref 20–29)
Calcium: 9.5 mg/dL (ref 8.6–10.2)
Chloride: 99 mmol/L (ref 96–106)
Creatinine, Ser: 0.99 mg/dL (ref 0.76–1.27)
Globulin, Total: 2.5 g/dL (ref 1.5–4.5)
Glucose: 112 mg/dL — ABNORMAL HIGH (ref 70–99)
Potassium: 4.1 mmol/L (ref 3.5–5.2)
Sodium: 138 mmol/L (ref 134–144)
Total Protein: 6.7 g/dL (ref 6.0–8.5)
eGFR: 82 mL/min/{1.73_m2} (ref >59)

## 2022-03-16 LAB — LIPID PANEL
Chol/HDL Ratio: 3.1 ratio (ref 0.0–5.0)
Cholesterol, Total: 99 mg/dL — ABNORMAL LOW (ref 100–199)
HDL: 32 mg/dL — ABNORMAL LOW (ref >39)
LDL Chol Calc (NIH): 42 mg/dL (ref 0–99)
Triglycerides: 146 mg/dL (ref 0–149)
VLDL Cholesterol Cal: 25 mg/dL (ref 5–40)

## 2022-03-16 LAB — VITAMIN D 25 HYDROXY (VIT D DEFICIENCY, FRACTURES): Vit D, 25-Hydroxy: 38 ng/mL (ref 30.0–100.0)

## 2022-03-16 LAB — HEMOGLOBIN A1C
Est. average glucose Bld gHb Est-mCnc: 120 mg/dL
Hgb A1c MFr Bld: 5.8 % — ABNORMAL HIGH (ref 4.8–5.6)

## 2022-03-25 ENCOUNTER — Other Ambulatory Visit: Payer: Self-pay | Admitting: Cardiovascular Disease

## 2022-03-25 ENCOUNTER — Telehealth: Payer: Self-pay | Admitting: Cardiovascular Disease

## 2022-03-25 ENCOUNTER — Ambulatory Visit (INDEPENDENT_AMBULATORY_CARE_PROVIDER_SITE_OTHER): Payer: Medicare Other

## 2022-03-25 DIAGNOSIS — I442 Atrioventricular block, complete: Secondary | ICD-10-CM | POA: Diagnosis not present

## 2022-03-25 LAB — CUP PACEART REMOTE DEVICE CHECK
Battery Remaining Longevity: 5 mo
Battery Voltage: 2.81 V
Brady Statistic AP VP Percent: 0 %
Brady Statistic AP VS Percent: 0 %
Brady Statistic AS VP Percent: 99.22 %
Brady Statistic AS VS Percent: 0.78 %
Brady Statistic RA Percent Paced: 0 %
Brady Statistic RV Percent Paced: 98.67 %
Date Time Interrogation Session: 20230413031603
HighPow Impedance: 46 Ohm
HighPow Impedance: 65 Ohm
Implantable Lead Implant Date: 20071217
Implantable Lead Implant Date: 20071217
Implantable Lead Implant Date: 20071217
Implantable Lead Location: 753858
Implantable Lead Location: 753859
Implantable Lead Location: 753860
Implantable Lead Model: 4194
Implantable Lead Model: 5076
Implantable Lead Model: 6947
Implantable Pulse Generator Implant Date: 20160524
Lead Channel Impedance Value: 285 Ohm
Lead Channel Impedance Value: 285 Ohm
Lead Channel Impedance Value: 361 Ohm
Lead Channel Impedance Value: 399 Ohm
Lead Channel Impedance Value: 551 Ohm
Lead Channel Impedance Value: 703 Ohm
Lead Channel Pacing Threshold Amplitude: 0.75 V
Lead Channel Pacing Threshold Amplitude: 1 V
Lead Channel Pacing Threshold Pulse Width: 0.4 ms
Lead Channel Pacing Threshold Pulse Width: 0.8 ms
Lead Channel Sensing Intrinsic Amplitude: 4.375 mV
Lead Channel Sensing Intrinsic Amplitude: 4.375 mV
Lead Channel Setting Pacing Amplitude: 2 V
Lead Channel Setting Pacing Amplitude: 2.5 V
Lead Channel Setting Pacing Pulse Width: 0.4 ms
Lead Channel Setting Pacing Pulse Width: 0.8 ms
Lead Channel Setting Sensing Sensitivity: 0.3 mV

## 2022-03-25 NOTE — Telephone Encounter (Signed)
Please make sure there has not been an increase in dietary sodium intake. OK to increase furosemide to 80 mg alternating to 40 mg daily. ?

## 2022-03-25 NOTE — Telephone Encounter (Signed)
Pt c/o swelling: STAT is pt has developed SOB within 24 hours ? ?If swelling, where is the swelling located? Mostly his ankles ? ?How much weight have you gained and in what time span? 5lbs in the last 3 weeks.  ? ?Have you gained 3 pounds in a day or 5 pounds in a week? Probably yes.  ? ?Do you have a log of your daily weights (if so, list)? no ? ?Are you currently taking a fluid pill? Yes, states he has been doubling up on some days.  ? ?Are you currently SOB? States he has been SOB for the last two - three weeks.  ? ?Have you traveled recently? No   ?

## 2022-03-25 NOTE — Telephone Encounter (Signed)
-  Spoke with pt who report c/o ankle swelling x 3 weeks. ?-He state before, swelling only occurred when he's been on his feet for a long period of time, but now its constant. ?-Pt also report some SOB with exertion ?-No weights to report as he state he doesn't weigh himself daily ? ?-Pt report he takes lasix 40 mg daily and will occasionally take and extra dose. On Monday he report he took 80 mg and swelling was better on Tuesday, but after working all day Wednesday they were back swollen. ? ?Will route to MD of recommendations ? ?Pt also state he will drop off FMLA paper work for MD to review.  ? ? ? ? ?

## 2022-03-26 NOTE — Telephone Encounter (Signed)
Left message to call back  

## 2022-03-29 ENCOUNTER — Other Ambulatory Visit: Payer: Self-pay | Admitting: Cardiovascular Disease

## 2022-03-29 ENCOUNTER — Telehealth: Payer: Self-pay | Admitting: Cardiovascular Disease

## 2022-03-29 MED ORDER — FUROSEMIDE 40 MG PO TABS: ORAL_TABLET | ORAL | 3 refills | Status: DC

## 2022-03-29 NOTE — Telephone Encounter (Signed)
Pt updated and verbalized understanding.  

## 2022-03-29 NOTE — Telephone Encounter (Signed)
03/29/22 ? ?Patient brought FMLA forms into office to be completed. Forms put in Dr.Croitoru box.  ?

## 2022-04-05 ENCOUNTER — Encounter: Payer: Self-pay | Admitting: Cardiovascular Disease

## 2022-04-06 NOTE — Telephone Encounter (Signed)
Patient forms ready for pick up at front desk ([place] office)  ?

## 2022-04-06 NOTE — Telephone Encounter (Signed)
04/06/22  ?Forms faxed to [employer], spoke with patient who is coming into office to pick up copy of forms.  ?

## 2022-04-07 NOTE — Telephone Encounter (Signed)
Update faxed ?

## 2022-04-12 NOTE — Progress Notes (Signed)
Remote ICD transmission.   

## 2022-04-13 DIAGNOSIS — Z20822 Contact with and (suspected) exposure to covid-19: Secondary | ICD-10-CM | POA: Diagnosis not present

## 2022-04-26 ENCOUNTER — Ambulatory Visit (INDEPENDENT_AMBULATORY_CARE_PROVIDER_SITE_OTHER): Payer: Medicare Other

## 2022-04-26 DIAGNOSIS — I442 Atrioventricular block, complete: Secondary | ICD-10-CM

## 2022-04-27 LAB — CUP PACEART REMOTE DEVICE CHECK
Battery Remaining Longevity: 5 mo
Battery Voltage: 2.8 V
Brady Statistic AP VP Percent: 0 %
Brady Statistic AP VS Percent: 0 %
Brady Statistic AS VP Percent: 98.37 %
Brady Statistic AS VS Percent: 1.63 %
Brady Statistic RA Percent Paced: 0 %
Brady Statistic RV Percent Paced: 97.17 %
Date Time Interrogation Session: 20230515031704
HighPow Impedance: 46 Ohm
HighPow Impedance: 63 Ohm
Implantable Lead Implant Date: 20071217
Implantable Lead Implant Date: 20071217
Implantable Lead Implant Date: 20071217
Implantable Lead Location: 753858
Implantable Lead Location: 753859
Implantable Lead Location: 753860
Implantable Lead Model: 4194
Implantable Lead Model: 5076
Implantable Lead Model: 6947
Implantable Pulse Generator Implant Date: 20160524
Lead Channel Impedance Value: 247 Ohm
Lead Channel Impedance Value: 285 Ohm
Lead Channel Impedance Value: 342 Ohm
Lead Channel Impedance Value: 399 Ohm
Lead Channel Impedance Value: 551 Ohm
Lead Channel Impedance Value: 722 Ohm
Lead Channel Pacing Threshold Amplitude: 0.75 V
Lead Channel Pacing Threshold Amplitude: 0.875 V
Lead Channel Pacing Threshold Pulse Width: 0.4 ms
Lead Channel Pacing Threshold Pulse Width: 0.8 ms
Lead Channel Sensing Intrinsic Amplitude: 4.375 mV
Lead Channel Sensing Intrinsic Amplitude: 4.375 mV
Lead Channel Setting Pacing Amplitude: 2 V
Lead Channel Setting Pacing Amplitude: 2.5 V
Lead Channel Setting Pacing Pulse Width: 0.4 ms
Lead Channel Setting Pacing Pulse Width: 0.8 ms
Lead Channel Setting Sensing Sensitivity: 0.3 mV

## 2022-05-11 NOTE — Patient Outreach (Signed)
Received a Health Coach referral notification for Brian Craig. I have assigned Brian Loron, RN to call for follow up and determine if there are any Case Management needs.    Brian Craig, [place], [place] [facility] [phone]

## 2022-05-14 NOTE — Progress Notes (Signed)
Remote ICD transmission.   

## 2022-05-14 NOTE — Addendum Note (Signed)
Addended by: Cheri Kearns A on: 05/14/2022 01:13 PM   Modules accepted: Level of Service

## 2022-05-17 ENCOUNTER — Other Ambulatory Visit: Payer: Self-pay | Admitting: *Deleted

## 2022-05-17 NOTE — Patient Outreach (Signed)
[facility]) Care Management  05/17/2022  Brian Craig January 09, 1951 [id number]  Unsuccessful outreach attempt made to patient. RN Health Coach left HIPAA compliant voicemail message along with her contact information.  Plan: RN Health Coach will call patient within the next several business day and will send an unsuccessful letter.   Emelia Loron RN, BSN [place] [phone] [email or web address]

## 2022-05-25 ENCOUNTER — Ambulatory Visit: Payer: Self-pay | Admitting: *Deleted

## 2022-05-27 ENCOUNTER — Ambulatory Visit (INDEPENDENT_AMBULATORY_CARE_PROVIDER_SITE_OTHER): Payer: Medicare Other

## 2022-05-27 DIAGNOSIS — I442 Atrioventricular block, complete: Secondary | ICD-10-CM

## 2022-05-27 LAB — CUP PACEART REMOTE DEVICE CHECK
Battery Remaining Longevity: 3 mo
Battery Voltage: 2.79 V
Brady Statistic AP VP Percent: 0 %
Brady Statistic AP VS Percent: 0 %
Brady Statistic AS VP Percent: 98.86 %
Brady Statistic AS VS Percent: 1.14 %
Brady Statistic RA Percent Paced: 0 %
Brady Statistic RV Percent Paced: 97.73 %
Date Time Interrogation Session: 20230615042205
HighPow Impedance: 46 Ohm
HighPow Impedance: 63 Ohm
Implantable Lead Implant Date: 20071217
Implantable Lead Implant Date: 20071217
Implantable Lead Implant Date: 20071217
Implantable Lead Location: 753858
Implantable Lead Location: 753859
Implantable Lead Location: 753860
Implantable Lead Model: 4194
Implantable Lead Model: 5076
Implantable Lead Model: 6947
Implantable Pulse Generator Implant Date: 20160524
Lead Channel Impedance Value: 285 Ohm
Lead Channel Impedance Value: 304 Ohm
Lead Channel Impedance Value: 342 Ohm
Lead Channel Impedance Value: 399 Ohm
Lead Channel Impedance Value: 551 Ohm
Lead Channel Impedance Value: 722 Ohm
Lead Channel Pacing Threshold Amplitude: 0.75 V
Lead Channel Pacing Threshold Amplitude: 1.25 V
Lead Channel Pacing Threshold Pulse Width: 0.4 ms
Lead Channel Pacing Threshold Pulse Width: 0.8 ms
Lead Channel Sensing Intrinsic Amplitude: 4.375 mV
Lead Channel Sensing Intrinsic Amplitude: 4.375 mV
Lead Channel Setting Pacing Amplitude: 2.25 V
Lead Channel Setting Pacing Amplitude: 2.5 V
Lead Channel Setting Pacing Pulse Width: 0.4 ms
Lead Channel Setting Pacing Pulse Width: 0.8 ms
Lead Channel Setting Sensing Sensitivity: 0.3 mV

## 2022-05-28 ENCOUNTER — Ambulatory Visit: Payer: Self-pay | Admitting: *Deleted

## 2022-06-07 ENCOUNTER — Ambulatory Visit (INDEPENDENT_AMBULATORY_CARE_PROVIDER_SITE_OTHER): Payer: Medicare Other | Admitting: Cardiovascular Disease

## 2022-06-07 ENCOUNTER — Encounter: Payer: Self-pay | Admitting: Cardiovascular Disease

## 2022-06-07 VITALS — BP 104/70 | HR 84 | Ht 73.0 in | Wt 335.6 lb

## 2022-06-07 DIAGNOSIS — G4733 Obstructive sleep apnea (adult) (pediatric): Secondary | ICD-10-CM | POA: Diagnosis not present

## 2022-06-07 DIAGNOSIS — I5032 Chronic diastolic (congestive) heart failure: Secondary | ICD-10-CM | POA: Diagnosis not present

## 2022-06-07 DIAGNOSIS — Z9989 Dependence on other enabling machines and devices: Secondary | ICD-10-CM

## 2022-06-07 DIAGNOSIS — E890 Postprocedural hypothyroidism: Secondary | ICD-10-CM

## 2022-06-07 DIAGNOSIS — D6869 Other thrombophilia: Secondary | ICD-10-CM

## 2022-06-07 DIAGNOSIS — E785 Hyperlipidemia, unspecified: Secondary | ICD-10-CM | POA: Diagnosis not present

## 2022-06-07 DIAGNOSIS — Z95810 Presence of automatic (implantable) cardiac defibrillator: Secondary | ICD-10-CM | POA: Diagnosis not present

## 2022-06-07 DIAGNOSIS — I4821 Permanent atrial fibrillation: Secondary | ICD-10-CM

## 2022-06-07 DIAGNOSIS — I442 Atrioventricular block, complete: Secondary | ICD-10-CM | POA: Diagnosis not present

## 2022-06-07 DIAGNOSIS — R7303 Prediabetes: Secondary | ICD-10-CM | POA: Diagnosis not present

## 2022-06-07 MED ORDER — EMPAGLIFLOZIN 10 MG PO TABS: 10.0000 mg | ORAL_TABLET | Freq: Every day | ORAL | 1 refills | Status: DC

## 2022-06-07 NOTE — Progress Notes (Signed)
Patient ID: Brian Craig, male   DOB: [birth year], 71 y.o.   MRN: [id number]    Cardiology Office Note    Date:  06/12/2022   ID:  Ketan Renz, DOB 04-Nov-1951, MRN [id number]  PCP:  Lorrene Reid, PA-C  Cardiologist:   Sanda Klein, MD   Chief Complaint  Patient presents with   Fatigue    History of Present Illness:  Brian Craig is a 71 y.o. male who presents for permanent atrial fibrillation, nonischemic cardiomyopathy, complete heart block status post AV node ablation, biventricular defibrillator check.  Generally doing well.  Continues have issues with frequent urination that sometimes interferes with his job.  Biggest complaint is that of fatigue.  Brian Craig wakes up feeling tired.  Brian Craig has not had a sleep clinic visit in quite a while but reports 100% compliance with CPAP.  Recently his dose of levothyroxine was increased and his TSH was elevated at 8.16, but the TSH has not been rechecked since.  Generally has NYHA functional class II dyspnea, but this is hard to assess since Brian Craig is pretty sedentary.  Brian Craig does not have lower extremity edema.  Brian Craig denies orthopnea or PND.  Brian Craig does express interest in starting an SGLT2 inhibitor and really any intervention that would help with weight loss.  Brian Craig denies palpitations, dizziness, syncope, sleeping a lot events, falls or bleeding problems.  Brian Craig reports compliance with Pradaxa.  Glycemic control is fair with a recent hemoglobin A1c of 5.8%.  Most recent LDL cholesterol was well within range at 42 (on simvastatin), but the HDL is quite low at 32 (chronic).  Brian Craig has normal renal function.  His BMI is 44  Brian Craig has not had any falls, injuries or bleeding problems.  Brian Craig is compliant with anticoagulation with Pradaxa.  His most recent lipid profile showed an excellent LDL at 49 but also stubbornly low HDL at 31 and borderline hemoglobin A1c at 5.8%.  Brian Craig is scheduled to see an endocrinologist in the near future.  Pacemaker interrogation shows permanent atrial  fibrillation and complete heart block, 98 % biventricular pacing.  There have been no episodes of VT/high ventricular rate.  His OptiVol has been in normal range in the last 3 months.  Activity is only 1.1 hours per day and for that level of activity his heart rate histogram appears appropriately distributed.  In the past Brian Craig had episodes of accelerated idioventricular rhythm and 2017 and 2018 but not since.  Estimated battery longevity is 3 months and we are now doing his downloads on a monthly basis.   Brian Craig has a history of severe dilated cardiomyopathy related to uncontrolled atrial fibrillation with rapid ventricular response that started in the setting of thyrotoxicosis. His left ventricular ejection fraction was as low as 17%. Cardioversion failed. Eventually underwent AV node ablation and received a biventricular pacemaker-defibrillator in 2007. Brian Craig underwent a generator change out in 2012 (atrial lead Medtronic 5076, ICD lead Medtronic [phone] Sprint Quattro secure, LV lead Medtronic 4194 Attain bipolar, Medtronic Viva XT gen change May 2016). His left ventricular ejection fraction has improved substantially since AV node ablation. His most recent echocardiogram performed in August 2015 shows normal left ventricular systolic function. Brian Craig remains in permanent atrial fibrillation.     Past Medical History:  Diagnosis Date   Atrial fibrillation (HCC)    CHF (congestive heart failure) ([place])    Graves disease    Graves   Hyperlipidemia    Hypertension    OSA treated with BiPAP  Substance abuse ([place])    alcohol    Past Surgical History:  Procedure Laterality Date   APPENDECTOMY     BIV ICD GENERTAOR CHANGE OUT Left 2012   Medtronic Protecta   CARDIAC ELECTROPHYSIOLOGY STUDY AND ABLATION     EP IMPLANTABLE DEVICE N/A 05/06/2015   Procedure: ICD Generator Changeout;  Surgeon: Sanda Klein, MD;  Location: [facility];  Service: Cardiovascular;  Laterality: N/A;   HERNIA REPAIR      MENISCUS REPAIR Right    VASECTOMY     unilateral due to injury    Outpatient Medications Prior to Visit  Medication Sig Dispense Refill   acetaminophen (TYLENOL) 325 MG tablet Take 650 mg by mouth every 6 (six) hours as needed for headache.     ALPRAZolam (XANAX) 0.5 MG tablet Take 1 tablet (0.5 mg total) by mouth 3 (three) times daily as needed for anxiety. 30 tablet 0   carvedilol (COREG CR) 80 MG 24 hr capsule TAKE 1 CAPSULE DAILY 90 capsule 3   Cholecalciferol 50 MCG (2000 UT) CAPS Take 2,000 Units by mouth daily.     furosemide (LASIX) 40 MG tablet Take lasix 40 mg (1 tablet) every other day alternating with 80 mg ( 2 tablets) every other day 180 tablet 3   levothyroxine (SYNTHROID, LEVOTHROID) 200 MCG tablet Take 200 mcg by mouth daily before breakfast.     lisinopril (ZESTRIL) 10 MG tablet TAKE 1 TABLET TWICE A DAY 180 tablet 3   Multiple Vitamin (MULTIVITAMIN) tablet Take 1 tablet by mouth daily.     potassium chloride (KLOR-CON) 10 MEQ tablet TAKE 1 TABLET DAILY 90 tablet 3   PRADAXA 150 MG CAPS capsule TAKE 1 CAPSULE TWICE DAILY 180 capsule 3   sertraline (ZOLOFT) 50 MG tablet TAKE 1 TABLET DAILY 90 tablet 1   simvastatin (ZOCOR) 40 MG tablet TAKE 1 TABLET DAILY AT 6 P.M. 90 tablet 3   fluticasone-salmeterol (ADVAIR DISKUS) 250-50 MCG/ACT AEPB Inhale 1 puff into the lungs in the morning and at bedtime. (Patient not taking: Reported on 12/28/2021) 60 each 1   No facility-administered medications prior to visit.     Allergies:   Patient has no known allergies.   Social History   Socioeconomic History   Marital status: Married    Spouse name: Not on file   Number of children: Not on file   Years of education: Not on file   Highest education level: Not on file  Occupational History   Not on file  Tobacco Use   Smoking status: Every Day    Packs/day: 0.50    Years: 51.00    Total pack years: 25.50    Types: Cigarettes   Smokeless tobacco: Never   Tobacco comments:     Half of pack daily  Substance and Sexual Activity   Alcohol use: No    Comment: recovering alcoholic   Drug use: No   Sexual activity: Yes    Birth control/protection: None  Other Topics Concern   Not on file  Social History Narrative   Not on file   Social Determinants of Health   Financial Resource Strain: Not on file  Food Insecurity: Not on file  Transportation Needs: Not on file  Physical Activity: Not on file  Stress: Not on file  Social Connections: Not on file     Family History:  The patient's family history includes Alcohol abuse in his maternal grandfather and maternal uncle; Heart attack in his father; Heart  disease in his father, mother, paternal aunt, and paternal uncle; Hyperlipidemia in his father; Hypertension in his father and mother.   ROS:   Please see the history of present illness.    ROS All other systems reviewed and are negative.   PHYSICAL EXAM:   VS:  BP 104/70 (BP Location: Left Arm, Patient Position: Sitting, Cuff Size: Large)   Pulse 84   Ht '6\' 1"'$  (1.854 m)   Wt (!) 335 lb 9.6 oz (152.2 kg)   SpO2 96%   BMI 44.28 kg/m      General: Alert, oriented x3, no distress, morbid obesity limits the physical exam.  Healthy left subclavian defibrillator site. Head: no evidence of trauma, PERRL, EOMI, no exophtalmos or lid lag, no myxedema, no xanthelasma; normal ears, nose and oropharynx Neck: normal jugular venous pulsations and no hepatojugular reflux; brisk carotid pulses without delay and no carotid bruits Chest: clear to auscultation, no signs of consolidation by percussion or palpation, normal fremitus, symmetrical and full respiratory excursions Cardiovascular: normal position and quality of the apical impulse, regular rhythm, normal first and second heart sounds, no murmurs, rubs or gallops Abdomen: no tenderness or distention, no masses by palpation, no abnormal pulsatility or arterial bruits, normal bowel sounds, no  hepatosplenomegaly Extremities: no clubbing, cyanosis or edema; 2+ radial, ulnar and brachial pulses bilaterally; 2+ right femoral, posterior tibial and dorsalis pedis pulses; 2+ left femoral, posterior tibial and dorsalis pedis pulses; no subclavian or femoral bruits Neurological: grossly nonfocal Psych: Normal mood and affect  Wt Readings from Last 3 Encounters:  06/07/22 (!) 335 lb 9.6 oz (152.2 kg)  03/15/22 (!) 338 lb (153.3 kg)  12/28/21 (!) 336 lb 9.6 oz (152.7 kg)      Studies/Labs Reviewed:   EKG:  EKG is ordered today.  Patient has background atrial fibrillation in the 100% ventricular paced rhythm with a distinct positive R wave in lead V1.  The QRS duration is 156 ms.  QTc 512 ms.  Recent Labs: 06/30/2021: TSH 8.160 03/15/2022: ALT 13; BUN 12; Creatinine, Ser 0.99; Hemoglobin 15.4; Platelets 230; Potassium 4.1; Sodium 138   Lipid Panel    Component Value Date/Time   CHOL 99 (L) 03/15/2022 0959   TRIG 146 03/15/2022 0959   HDL 32 (L) 03/15/2022 0959   CHOLHDL 3.1 03/15/2022 0959   CHOLHDL 3.6 02/09/2017 1103   VLDL 25 02/09/2017 1103   LDLCALC 42 03/15/2022 0959      ASSESSMENT:    1. CHB (complete heart block) ([place])   2. Permanent atrial fibrillation ([place])   3. Acquired thrombophilia ([place])   4. Biventricular ICD  implanted 2007, generator change in 2012   5. Chronic diastolic heart failure ([place])   6. OSA on CPAP   7. Morbid obesity ([place])   8. Prediabetes   9. Dyslipidemia (high LDL; low HDL)   10. Postablative hypothyroidism       PLAN:  In order of problems listed above:  1. CHB: s/p AV node ablation.  Pacemaker dependent 2. AFib: Permanent arrhythmia.  No history of previous stroke or other embolic events. CHADSvasc 2 (age, CHF).   3. Anticoagulation: Denies falls or serious bleeding problems.  Most recent hemoglobin 15.4. 4. CRT-D: Normal device function with estimated ERI in 3 months.  Doing monthly downloads.  We discussed upgrade to CRT-P in view  of LVEF recovery and his age.  Brian Craig would like to go ahead and receive another defibrillator. 5. CHF: Fully recovered LVEF.  Sedentary but appears  to have NYHA functional class I.  On ACE inhibitor and carvedilol.  Unlikely to tolerate higher doses since his blood pressure is in the low normal range.  We will start Jardiance 10 mg daily.  Discussed the potential for groin yeast infections, urinary tract infections, even Fournier's gangrene, how to detect this and under which circumstances she should immediately stop the Jardiance.  Occasionally has to take extra dose of diuretic for volume overload, but his OptiVol has been in normal range for the last 3 months. 6. OSA on CPAP: Worsening recent fatigue and hypersomnolence.  Will refer to sleep clinic.  Continue 100% CPAP compliance.  Alternative causes could be insufficiently compensated hypothyroidism. 7. Obesity/prediabetes: Started Jardiance primarily for heart failure, although this will also help with glycemic control and hopefully will contribute to weight loss. 8.  Dyslipidemia: Typical pattern for insulin resistance.  HDL will not improve without substantial weight loss. 9. Hypothyroidism, postradioiodine therapy: TSH was a little high at 8.16 in July 2022.  Brian Craig should have his TSH rechecked in the near future.  Brian Craig is complaining of fatigue and most recently his TSH was abnormally high at 8.16.  (The dose of levothyroxine was adjusted after that). 10. Alcoholism in recovery: Remains abstinent   Medication Adjustments/Labs and Tests Ordered: Current medicines are reviewed at length with the patient today.  Concerns regarding medicines are outlined above.  Medication changes, Labs and Tests ordered today are listed in the Patient Instructions below. Patient Instructions  Medication Instructions:  Start Jardiance 10 mg once daily  *If you need a refill on your cardiac medications before your next appointment, please call your pharmacy*   Lab  Work: None ordered If you have labs (blood work) drawn today and your tests are completely normal, you will receive your results only by: [place] (if you have MyChart) OR A paper copy in the mail If you have any lab test that is abnormal or we need to change your treatment, we will call you to review the results.   Testing/Procedures: None ordered   Follow-Up: At [facility], you and your health needs are our priority.  As part of our continuing mission to provide you with exceptional heart care, we have created designated Provider Care Teams.  These Care Teams include your primary Cardiologist (physician) and Advanced Practice Providers (APPs -  Physician Assistants and Nurse Practitioners) who all work together to provide you with the care you need, when you need it.  We recommend signing up for the patient portal called "MyChart".  Sign up information is provided on this After Visit Summary.  MyChart is used to connect with patients for Virtual Visits (Telemedicine).  Patients are able to view lab/test results, encounter notes, upcoming appointments, etc.  Non-urgent messages can be sent to your provider as well.   To learn more about what you can do with MyChart, go to [email or web address].    Your next appointment:   6 month appointment with Dr. Sallyanne Kuster on a device day First available with Dr. Claiborne Billings on a sleep clinic appointment   Important Information About Sugar           Signed, Sanda Klein, MD  06/12/2022 5:27 PM    [address], [place], [place]  [address] Phone: [phone]; Fax: [phone]

## 2022-06-12 ENCOUNTER — Encounter: Payer: Self-pay | Admitting: Cardiovascular Disease

## 2022-06-22 DIAGNOSIS — H1131 Conjunctival hemorrhage, right eye: Secondary | ICD-10-CM | POA: Diagnosis not present

## 2022-07-05 ENCOUNTER — Ambulatory Visit (INDEPENDENT_AMBULATORY_CARE_PROVIDER_SITE_OTHER): Payer: Medicare Other

## 2022-07-05 DIAGNOSIS — I442 Atrioventricular block, complete: Secondary | ICD-10-CM

## 2022-07-06 LAB — CUP PACEART REMOTE DEVICE CHECK
Battery Remaining Longevity: 3 mo
Battery Voltage: 2.76 V
Brady Statistic AP VP Percent: 0 %
Brady Statistic AP VS Percent: 0 %
Brady Statistic AS VP Percent: 99.36 %
Brady Statistic AS VS Percent: 0.64 %
Brady Statistic RA Percent Paced: 0 %
Brady Statistic RV Percent Paced: 99.11 %
Date Time Interrogation Session: 20230724115756
HighPow Impedance: 45 Ohm
HighPow Impedance: 62 Ohm
Implantable Lead Implant Date: 20071217
Implantable Lead Implant Date: 20071217
Implantable Lead Implant Date: 20071217
Implantable Lead Location: 753858
Implantable Lead Location: 753859
Implantable Lead Location: 753860
Implantable Lead Model: 4194
Implantable Lead Model: 5076
Implantable Lead Model: 6947
Implantable Pulse Generator Implant Date: 20160524
Lead Channel Impedance Value: 247 Ohm
Lead Channel Impedance Value: 285 Ohm
Lead Channel Impedance Value: 342 Ohm
Lead Channel Impedance Value: 399 Ohm
Lead Channel Impedance Value: 475 Ohm
Lead Channel Impedance Value: 608 Ohm
Lead Channel Pacing Threshold Amplitude: 0.75 V
Lead Channel Pacing Threshold Amplitude: 1 V
Lead Channel Pacing Threshold Pulse Width: 0.4 ms
Lead Channel Pacing Threshold Pulse Width: 0.8 ms
Lead Channel Sensing Intrinsic Amplitude: 4.375 mV
Lead Channel Sensing Intrinsic Amplitude: 4.375 mV
Lead Channel Setting Pacing Amplitude: 2.25 V
Lead Channel Setting Pacing Amplitude: 2.5 V
Lead Channel Setting Pacing Pulse Width: 0.4 ms
Lead Channel Setting Pacing Pulse Width: 0.8 ms
Lead Channel Setting Sensing Sensitivity: 0.3 mV

## 2022-07-15 ENCOUNTER — Ambulatory Visit: Payer: Medicare Other | Admitting: Cardiovascular Disease

## 2022-07-26 ENCOUNTER — Other Ambulatory Visit: Payer: Self-pay | Admitting: Physician Assistant

## 2022-07-26 DIAGNOSIS — F419 Anxiety disorder, unspecified: Secondary | ICD-10-CM

## 2022-07-29 ENCOUNTER — Ambulatory Visit (INDEPENDENT_AMBULATORY_CARE_PROVIDER_SITE_OTHER): Payer: Medicare Other

## 2022-07-29 DIAGNOSIS — I442 Atrioventricular block, complete: Secondary | ICD-10-CM

## 2022-07-29 LAB — CUP PACEART REMOTE DEVICE CHECK
Battery Remaining Longevity: 2 mo
Battery Voltage: 2.75 V
Brady Statistic AP VP Percent: 0 %
Brady Statistic AP VS Percent: 0 %
Brady Statistic AS VP Percent: 99.64 %
Brady Statistic AS VS Percent: 0.36 %
Brady Statistic RA Percent Paced: 0 %
Brady Statistic RV Percent Paced: 99.02 %
Date Time Interrogation Session: 20230817012409
HighPow Impedance: 49 Ohm
HighPow Impedance: 73 Ohm
Implantable Lead Implant Date: 20071217
Implantable Lead Implant Date: 20071217
Implantable Lead Implant Date: 20071217
Implantable Lead Location: 753858
Implantable Lead Location: 753859
Implantable Lead Location: 753860
Implantable Lead Model: 4194
Implantable Lead Model: 5076
Implantable Lead Model: 6947
Implantable Pulse Generator Implant Date: 20160524
Lead Channel Impedance Value: 285 Ohm
Lead Channel Impedance Value: 361 Ohm
Lead Channel Impedance Value: 399 Ohm
Lead Channel Impedance Value: 399 Ohm
Lead Channel Impedance Value: 532 Ohm
Lead Channel Impedance Value: 703 Ohm
Lead Channel Pacing Threshold Amplitude: 0.625 V
Lead Channel Pacing Threshold Amplitude: 1.25 V
Lead Channel Pacing Threshold Pulse Width: 0.4 ms
Lead Channel Pacing Threshold Pulse Width: 0.8 ms
Lead Channel Sensing Intrinsic Amplitude: 4.375 mV
Lead Channel Sensing Intrinsic Amplitude: 4.375 mV
Lead Channel Setting Pacing Amplitude: 2.5 V
Lead Channel Setting Pacing Amplitude: 2.5 V
Lead Channel Setting Pacing Pulse Width: 0.4 ms
Lead Channel Setting Pacing Pulse Width: 0.8 ms
Lead Channel Setting Sensing Sensitivity: 0.3 mV

## 2022-08-03 ENCOUNTER — Encounter: Payer: Self-pay | Admitting: *Deleted

## 2022-08-03 ENCOUNTER — Encounter: Payer: Self-pay | Admitting: Cardiovascular Disease

## 2022-08-03 ENCOUNTER — Telehealth (INDEPENDENT_AMBULATORY_CARE_PROVIDER_SITE_OTHER): Payer: Medicare Other | Admitting: Cardiovascular Disease

## 2022-08-03 ENCOUNTER — Telehealth: Payer: Self-pay

## 2022-08-03 ENCOUNTER — Telehealth: Payer: Self-pay | Admitting: *Deleted

## 2022-08-03 ENCOUNTER — Telehealth: Payer: Self-pay | Admitting: Cardiovascular Disease

## 2022-08-03 VITALS — Ht 73.0 in

## 2022-08-03 DIAGNOSIS — I4821 Permanent atrial fibrillation: Secondary | ICD-10-CM | POA: Diagnosis not present

## 2022-08-03 DIAGNOSIS — I442 Atrioventricular block, complete: Secondary | ICD-10-CM | POA: Diagnosis not present

## 2022-08-03 DIAGNOSIS — D6869 Other thrombophilia: Secondary | ICD-10-CM

## 2022-08-03 DIAGNOSIS — I5032 Chronic diastolic (congestive) heart failure: Secondary | ICD-10-CM

## 2022-08-03 DIAGNOSIS — Z4502 Encounter for adjustment and management of automatic implantable cardiac defibrillator: Secondary | ICD-10-CM

## 2022-08-03 NOTE — Telephone Encounter (Signed)
Patient stated Dr. Sallyanne Kuster already spoke to him about pacer battery and is scheduled for Monday.

## 2022-08-03 NOTE — Patient Instructions (Signed)
Medication Instructions:  No changes *If you need a refill on your cardiac medications before your next appointment, please call your pharmacy*  Follow-Up: At [facility], you and your health needs are our priority.  As part of our continuing mission to provide you with exceptional heart care, we have created designated Provider Care Teams.  These Care Teams include your primary Cardiologist (physician) and Advanced Practice Providers (APPs -  Physician Assistants and Nurse Practitioners) who all work together to provide you with the care you need, when you need it.  We recommend signing up for the patient portal called "MyChart".  Sign up information is provided on this After Visit Summary.  MyChart is used to connect with patients for Virtual Visits (Telemedicine).  Patients are able to view lab/test results, encounter notes, upcoming appointments, etc.  Non-urgent messages can be sent to your provider as well.   To learn more about what you can do with MyChart, go to [email or web address].    Your next appointment:   6 month(s)  The format for your next appointment:   In Person  Provider:   Sanda Klein, MD {   Other Instructions    Implantable Device Instructions    Brian Craig  08/03/2022  You are scheduled for a ICD generator change on Wednesday, August 30 with Dr. Virl Axe.  1. Pre procedure Lab testing: You will come to the [facility] lab for your pre-procedure blood work.  These are walk in labs- you will not need an appointment and you do not need to be fasting. The lab is open from 8-4:30 pm.  The lab is closed from 1-2 pm for lunch. You will need to have them dine by Friday 8/25  2. Please arrive at the Main Entrance A at [facility], [place] [address] on August 30 at 8:00 AM (This time is two hours before your procedure to ensure your preparation). Free valet parking service is available. You will check in at ADMITTING. The  support person will be asked to wait in the waiting room.  It is OK to have someone drop you off and come back when you are ready to be discharged.        Special note: Every effort is made to have your procedure done on time. Please understand that emergencies sometimes delay  scheduled procedures.  3.  No eating or drinking after midnight prior to procedure.     4.  Medication instructions:   Hold the Pradaxa Tuesday, 8/29, and then the morning of the procedure Hold the Furosemide the morning of the procedure  5.  The night before your procedure and the morning of your procedure scrub your neck/chest with CHG surgical scrub.  See instruction letter.  6. Plan to go home the same day, you will only stay overnight if medically necessary. 7.  You MUST have a responsible adult to drive you home. 8.   An adult MUST be with you the first 24 hours after you arrive home. 66..  Bring a current list of your medications, and the last time and date medication taken. 10. Bring ID and current insurance cards. 11. .Please wear clothes that are easy to get on and off and wear slip-on shoes.    You will follow up with the [facility] clinic 10-14 days after your procedure.  You will follow up with Dr. Caryl Comes 91 days after your procedure.  These appointments will be made for you.   *  If you have ANY questions after you get home, please call the office at [phone] or send a MyChart message.  FYI: For your safety, and to allow [place] to monitor your vital signs accurately during the surgery/procedure we request that if you have artificial nails, gel coating, SNS etc. Please have those removed prior to your surgery/procedure. Not having the nail coverings /polish removed may result in cancellation or delay of your surgery/procedure.    [facility] - Preparing For Surgery    Before surgery, you can play an important role. Because skin is not sterile, your skin needs to be as free of germs as possible.  You can reduce the number of germs on your skin by washing with CHG (chlorahexidine gluconate) Soap before surgery.  CHG is an antiseptic cleaner which kills germs and bonds with the skin to continue killing germs even after washing.  Please do not use if you have an allergy to CHG or antibacterial soaps.  If your skin becomes reddened/irritated stop using the CHG.   Do not shave (including legs and underarms) for at least 48 hours prior to first CHG shower.  It is OK to shave your face.  Please follow these instructions carefully:  1.  Shower the night before surgery and the morning of surgery with CHG.  2.  If you choose to wash your hair, wash your hair first as usual with your normal shampoo.  3.  After you shampoo, rinse your hair and body thoroughly to remove the shampoo.  4.  Use CHG as you would any other liquid soap.  You can apply CHG directly to the skin and wash gently with a clean washcloth. 5.  Apply the CHG Soap to your body ONLY FROM THE NECK DOWN.  Do not use on open wounds or open sores.  Avoid contact with your eyes, ears, mouth and genitals (private parts).  Wash genitals (private parts) with your normal soap.  6.  Wash thoroughly, paying special attention to the area where your surgery will be performed.  7.  Thoroughly rinse your body with warm water from the neck down.   8.  DO NOT shower/wash with your normal soap after using and rinsing off the CHG soap.  9.  Pat yourself dry with a clean towel.           10.  Wear clean pajamas.           11.  Place clean sheets on your bed the night of your first shower and do not sleep with pets.  Day of Surgery: Do not apply any deodorants/lotions.  Please wear clean clothes to the hospital/surgery center.

## 2022-08-03 NOTE — Telephone Encounter (Signed)
ICD reached RRT 08/03/22. Patient was recently seen In office 05/2022, not sure if he needs another apt to discuss. Routing to schedule gen change.

## 2022-08-03 NOTE — Progress Notes (Signed)
Virtual Visit via Telephone Note   Because of Brian Craig co-morbid illnesses, he is at least at moderate risk for complications without adequate follow up.  This format is felt to be most appropriate for this patient at this time.  The patient did not have access to video technology/had technical difficulties with video requiring transitioning to audio format only (telephone).  All issues noted in this document were discussed and addressed.  No physical exam could be performed with this format.  Please refer to the patient's chart for his consent to telehealth for [facility].    Date:  08/03/2022   ID:  Brian Craig, DOB 05-Feb-1951, MRN [id number] The patient was identified using 2 identifiers.  Patient Location: Home Provider Location: Home Office   PCP:  Lorrene Reid, [place] [facility] Cardiologist:  Sanda Klein, MD     Evaluation Performed:  Follow-Up Visit  Chief Complaint: Device at ERI  History of Present Illness:    Brian Craig is a 71 y.o. male with permanent atrial fibrillation and AV node ablation with secondary complete heart block, status post CRT-D, nonischemic cardiomyopathy, whose ICD has just announced need for replacement earlier today.  We had just discussed the plan for generator change out with him at a recent appointment less than 2 months ago.  At that time we also discussed whether he would consider downgrade to CRT-P, but he prefers to continue with defibrillator therapy.  He has not noticed any change in his symptoms since that time.  Continues have NYHA functional class II dyspnea on most days but he is very sedentary.  Need for frequent urination from diuretic use interferes with his job frequently.  He denies daytime hypersomnolence and reports 100% compliance with CPAP.  He does not have orthopnea, PND or change in his chronic lower extremity edema.  He denies chest pain, palpitations, dizziness, syncope or defibrillator  discharges.  He has not had any falls, injuries or serious bleeding while on anticoagulation with Pradaxa.   Glycemic control is fair with a recent hemoglobin A1c of 5.8%.  Most recent LDL cholesterol was well within range at 42 (on simvastatin), but the HDL is quite low at 32 (chronic).  He has normal renal function.  His BMI is 44.  We started Jardiance at his last appointment June 2023   He has not had any falls, injuries or bleeding problems.  He is compliant with anticoagulation with Pradaxa.  His most recent lipid profile showed an excellent LDL at 49 but also stubbornly low HDL at 31 and borderline hemoglobin A1c at 5.8%.  He is scheduled to see an endocrinologist in the near future.   Pacemaker interrogation shows permanent atrial fibrillation and complete heart block, 98 % biventricular pacing.  There have been no episodes of VT/high ventricular rate.  His OptiVol has been in normal range in the last 3 months.  Activity is only 1.1 hours per day and for that level of activity his heart rate histogram appears appropriately distributed.  In the past he had episodes of accelerated idioventricular rhythm and 2017 and 2018 but not since.     Brian Craig has a history of severe dilated cardiomyopathy related to uncontrolled atrial fibrillation with rapid ventricular response that started in the setting of thyrotoxicosis. His left ventricular ejection fraction was as low as 17%. Cardioversion failed. Eventually underwent AV node ablation and received a biventricular pacemaker-defibrillator in 2007. He underwent a generator change out in 2012 (atrial lead  Medtronic [id number], ICD lead Medtronic [phone] Sprint Quattro secure, LV lead Medtronic 4194 Attain bipolar, Medtronic Viva XT gen change May 2016). His left ventricular ejection fraction has improved substantially since AV node ablation. His most recent echocardiogram performed in August 2015 shows normal left ventricular systolic function. He remains in  permanent atrial fibrillation.      Past Medical History:  Diagnosis Date   Atrial fibrillation (HCC)    CHF (congestive heart failure) ([place])    Graves disease    Graves   Hyperlipidemia    Hypertension    OSA treated with BiPAP    Substance abuse ([place])    alcohol   Past Surgical History:  Procedure Laterality Date   APPENDECTOMY     BIV ICD GENERTAOR CHANGE OUT Left 2012   Medtronic Protecta   CARDIAC ELECTROPHYSIOLOGY STUDY AND ABLATION     EP IMPLANTABLE DEVICE N/A 05/06/2015   Procedure: ICD Generator Changeout;  Surgeon: Sanda Klein, MD;  Location: [facility];  Service: Cardiovascular;  Laterality: N/A;   HERNIA REPAIR     MENISCUS REPAIR Right    VASECTOMY     unilateral due to injury     No outpatient medications have been marked as taking for the 08/03/22 encounter (Video Visit) with Gael Londo, Dani Gobble, MD.     Allergies:   Patient has no known allergies.   Social History   Tobacco Use   Smoking status: Every Day    Packs/day: 0.50    Years: 51.00    Total pack years: 25.50    Types: Cigarettes   Smokeless tobacco: Never   Tobacco comments:    Half of pack daily  Substance Use Topics   Alcohol use: No    Comment: recovering alcoholic   Drug use: No     Family Hx: The patient's family history includes Alcohol abuse in his maternal grandfather and maternal uncle; Heart attack in his father; Heart disease in his father, mother, paternal aunt, and paternal uncle; Hyperlipidemia in his father; Hypertension in his father and mother.  ROS:   Please see the history of present illness.     All other systems reviewed and are negative.   Prior CV studies:   The following studies were reviewed today:  Device download today shows pacemaker generator at RRT  Labs/Other Tests and Data Reviewed:    EKG: Reviewed ECG from 06/07/2022 which shows background atrial fibrillation and 100% ventricular paced rhythm.  There is a distinct positive R wave in  lead V1 consistent with LV lead capture.  QRS duration is 156 ms, QTc 512 ms  Recent Labs: 03/15/2022: ALT 13; BUN 12; Creatinine, Ser 0.99; Hemoglobin 15.4; Platelets 230; Potassium 4.1; Sodium 138   Recent Lipid Panel Lab Results  Component Value Date/Time   CHOL 99 (L) 03/15/2022 09:59 AM   TRIG 146 03/15/2022 09:59 AM   HDL 32 (L) 03/15/2022 09:59 AM   CHOLHDL 3.1 03/15/2022 09:59 AM   CHOLHDL 3.6 02/09/2017 11:03 AM   LDLCALC 42 03/15/2022 09:59 AM    Wt Readings from Last 3 Encounters:  06/07/22 (!) 335 lb 9.6 oz (152.2 kg)  03/15/22 (!) 338 lb (153.3 kg)  12/28/21 (!) 336 lb 9.6 oz (152.7 kg)     Risk Assessment/Calculations:    CHA2DS2-VASc Score = 2   This indicates a 2.2% annual risk of stroke. The patient's score is based upon: CHF History: 1 HTN History: 0 Diabetes History: 0 Stroke History: 0 Vascular Disease History: 0  Age Score: 1 Gender Score: 0         Objective:    Vital Signs:  There were no vitals taken for this visit.   No evidence of respiratory distress or difficulty speaking during phone conversation.  ASSESSMENT & PLAN:    1. CHB: s/p AV node ablation.  Pacemaker dependent.  Recommend using an antibiotic Aigis pouch at the time of his procedure. 2. AFib: Permanent arrhythmia.  No history of previous stroke or other embolic events. CHADSvasc 2-3 (age, CHF, +/-prediabetes).   3. Anticoagulation: Denies falls or serious bleeding problems.  We will hold Pradaxa for 3 doses before the procedure. 4. CRT-D: Device battery depletion.  We discussed upgrade to CRT-P in view of LVEF recovery and his age.  He would like to go ahead and receive another defibrillator. 5. CHF: With recovered EF, NYHA functional class II, on maximum tolerated doses of ACE inhibitor and carvedilol and unlikely to tolerate higher doses or Entresto due to his blood pressure.  Started Jardiance about 2 months ago.   Occasionally has to take extra dose of diuretic for volume  overload. 6. OSA on CPAP: Worsening recent fatigue and hypersomnolence.  Will refer to sleep clinic.  Continue 100% CPAP compliance.  Alternative causes could be insufficiently compensated hypothyroidism. 7. Obesity/prediabetes: Started Jardiance primarily for heart failure, although this will also help with glycemic control and hopefully will contribute to weight loss. 8.  Dyslipidemia: Typical pattern for insulin resistance.  HDL will not improve without substantial weight loss. 9. Hypothyroidism, postradioiodine therapy: TSH was a little high at 8.16 in July 2022.  He should have his TSH rechecked in the near future.  He is complaining of fatigue and most recently his TSH was abnormally high at 8.16.  (The dose of levothyroxine was adjusted after that). 10. Alcoholism in recovery: Remains abstinent       Time:   Today, I have spent 22 minutes with the patient with telehealth technology discussing the above problems.     Medication Adjustments/Labs and Tests Ordered: Current medicines are reviewed at length with the patient today.  Concerns regarding medicines are outlined above.   Tests Ordered: No orders of the defined types were placed in this encounter.   Medication Changes: No orders of the defined types were placed in this encounter.    Signed, Sanda Klein, MD  08/03/2022 8:54 AM    [place]

## 2022-08-03 NOTE — Telephone Encounter (Signed)
Pt would like a callback regarding low battery om his Pacemaker. Please advise

## 2022-08-03 NOTE — Telephone Encounter (Signed)
Disability forms filled out and left up front for the patient to pick up. They have also been sent to be scanned.

## 2022-08-05 DIAGNOSIS — I5032 Chronic diastolic (congestive) heart failure: Secondary | ICD-10-CM | POA: Diagnosis not present

## 2022-08-05 DIAGNOSIS — I4821 Permanent atrial fibrillation: Secondary | ICD-10-CM | POA: Diagnosis not present

## 2022-08-06 LAB — CBC
Hematocrit: 47.3 % (ref 37.5–51.0)
Hemoglobin: 16.5 g/dL (ref 13.0–17.7)
MCH: 33.3 pg — ABNORMAL HIGH (ref 26.6–33.0)
MCHC: 34.9 g/dL (ref 31.5–35.7)
MCV: 96 fL (ref 79–97)
Platelets: 222 10*3/uL (ref 150–450)
RBC: 4.95 x10E6/uL (ref 4.14–5.80)
RDW: 12.1 % (ref 11.6–15.4)
WBC: 8.5 10*3/uL (ref 3.4–10.8)

## 2022-08-06 LAB — COMPREHENSIVE METABOLIC PANEL
ALT: 15 IU/L (ref 0–44)
AST: 17 IU/L (ref 0–40)
Albumin/Globulin Ratio: 1.7 (ref 1.2–2.2)
Albumin: 4.3 g/dL (ref 3.9–4.9)
Alkaline Phosphatase: 87 IU/L (ref 44–121)
BUN/Creatinine Ratio: 10 (ref 10–24)
BUN: 10 mg/dL (ref 8–27)
Bilirubin Total: 0.5 mg/dL (ref 0.0–1.2)
CO2: 25 mmol/L (ref 20–29)
Calcium: 9.3 mg/dL (ref 8.6–10.2)
Chloride: 103 mmol/L (ref 96–106)
Creatinine, Ser: 1 mg/dL (ref 0.76–1.27)
Globulin, Total: 2.5 g/dL (ref 1.5–4.5)
Glucose: 99 mg/dL (ref 70–99)
Potassium: 4.6 mmol/L (ref 3.5–5.2)
Sodium: 142 mmol/L (ref 134–144)
Total Protein: 6.8 g/dL (ref 6.0–8.5)
eGFR: 81 mL/min/{1.73_m2} (ref >59)

## 2022-08-06 LAB — TSH: TSH: 6.31 u[IU]/mL — ABNORMAL HIGH (ref 0.450–4.500)

## 2022-08-06 NOTE — Progress Notes (Signed)
Remote ICD transmission.   

## 2022-08-10 NOTE — Pre-Procedure Instructions (Signed)
Instructed patient on the following items: Arrival time 0800 Nothing to eat or drink after midnight No meds AM of procedure Responsible person to drive you home and stay with you for 24 hrs Wash with special soap night before and morning of procedure If on anti-coagulant drug instructions Pradaxa- last dose 8/28

## 2022-08-11 ENCOUNTER — Encounter (HOSPITAL_COMMUNITY)
Admission: RE | Disposition: A | Discharge status: Home / Self Care | Payer: Self-pay | Source: Ambulatory Visit | Attending: Internal Medicine

## 2022-08-11 ENCOUNTER — Ambulatory Visit (HOSPITAL_COMMUNITY)
Admission: RE | Admit: 2022-08-11 | Discharge: 2022-08-11 | Disposition: A | Discharge status: Home / Self Care | Payer: Medicare Other | Source: Ambulatory Visit | Attending: Internal Medicine | Admitting: Internal Medicine

## 2022-08-11 ENCOUNTER — Other Ambulatory Visit: Payer: Self-pay

## 2022-08-11 ENCOUNTER — Ambulatory Visit: Payer: Medicare Other | Admitting: Cardiovascular Disease

## 2022-08-11 DIAGNOSIS — I4821 Permanent atrial fibrillation: Secondary | ICD-10-CM | POA: Insufficient documentation

## 2022-08-11 DIAGNOSIS — I428 Other cardiomyopathies: Secondary | ICD-10-CM

## 2022-08-11 DIAGNOSIS — I442 Atrioventricular block, complete: Secondary | ICD-10-CM | POA: Diagnosis not present

## 2022-08-11 DIAGNOSIS — Z4502 Encounter for adjustment and management of automatic implantable cardiac defibrillator: Secondary | ICD-10-CM | POA: Insufficient documentation

## 2022-08-11 HISTORY — PX: BIV ICD GENERATOR CHANGEOUT: EP1194

## 2022-08-11 SURGERY — BIV ICD GENERATOR CHANGEOUT

## 2022-08-11 MED ORDER — POVIDONE-IODINE 10 % EX SWAB
2.0000 | Freq: Once | CUTANEOUS | Status: AC
  Administered 2022-08-11: 2 via TOPICAL

## 2022-08-11 MED ORDER — ACETAMINOPHEN 325 MG PO TABS: 325.0000 mg | ORAL_TABLET | ORAL | Status: DC | PRN

## 2022-08-11 MED ORDER — LIDOCAINE HCL (PF) 1 % IJ SOLN
INTRAMUSCULAR | Status: DC | PRN
  Administered 2022-08-11: 60 mL

## 2022-08-11 MED ORDER — GENTAMICIN SULFATE 40 MG/ML IJ SOLN
INTRAMUSCULAR | Status: AC
  Filled 2022-08-11: qty 2

## 2022-08-11 MED ORDER — SODIUM CHLORIDE 0.9 % IV SOLN
80.0000 mg | INTRAVENOUS | Status: AC
  Administered 2022-08-11: 80 mg

## 2022-08-11 MED ORDER — CEFAZOLIN (ANCEF) 1 G IV SOLR: 3.0000 g | INTRAVENOUS | Status: DC

## 2022-08-11 MED ORDER — CEFAZOLIN IN SODIUM CHLORIDE 3-0.9 GM/100ML-% IV SOLN
3.0000 g | Freq: Once | INTRAVENOUS | Status: AC
  Administered 2022-08-11: 3 g via INTRAVENOUS
  Filled 2022-08-11: qty 100

## 2022-08-11 MED ORDER — MIDAZOLAM HCL 5 MG/5ML IJ SOLN
INTRAMUSCULAR | Status: DC | PRN
  Administered 2022-08-11 (×2): 1 mg via INTRAVENOUS

## 2022-08-11 MED ORDER — MIDAZOLAM HCL 5 MG/5ML IJ SOLN
INTRAMUSCULAR | Status: AC
Start: 2022-08-11 — End: ?
  Filled 2022-08-11: qty 5

## 2022-08-11 MED ORDER — CHLORHEXIDINE GLUCONATE 4 % EX LIQD: 4.0000 | Freq: Once | CUTANEOUS | Status: DC

## 2022-08-11 MED ORDER — SODIUM CHLORIDE 0.9 % IV SOLN: INTRAVENOUS | Status: DC

## 2022-08-11 MED ORDER — LIDOCAINE HCL 1 % IJ SOLN
INTRAMUSCULAR | Status: AC
  Filled 2022-08-11: qty 60

## 2022-08-11 MED ORDER — CEFAZOLIN SODIUM-DEXTROSE 2-4 GM/100ML-% IV SOLN: 2.0000 g | INTRAVENOUS | Status: DC

## 2022-08-11 SURGICAL SUPPLY — 9 items
CABLE SURGICAL S-101-97-12 (CABLE) IMPLANT
DEVICE DISSECT PLASMABLAD 3.0S (MISCELLANEOUS) IMPLANT
HEMOSTAT SURGICEL 2X4 FIBR (HEMOSTASIS) IMPLANT
ICD CLARIA MRI DTMA1D1 (ICD Generator) IMPLANT
PAD DEFIB RADIO PHYSIO CONN (PAD) IMPLANT
PLASMABLADE 3.0S (MISCELLANEOUS) ×1
POUCH AIGIS-R ANTIBACT ICD (Mesh General) ×1 IMPLANT
POUCH AIGIS-R ANTIBACT ICD LRG (Mesh General) IMPLANT
TRAY PACEMAKER INSERTION (PACKS) IMPLANT

## 2022-08-11 NOTE — H&P (Signed)
Patient Care Team: Lorrene Reid, PA-C as PCP - General (Physician Assistant) Sanda Meyer Dockery, MD as PCP - Cardiology (Cardiology) Brendolyn Patty, MD (Dermatology) Warden Fillers, MD as Consulting Physician (Ophthalmology) Amalia Greenhouse, MD as Referring Physician (Endocrinology) Croitoru, Dani Gobble, MD as Consulting Physician (Cardiology)   HPI  Brian Craig is a 71 y.o. male pt of MCr with previously implanted devices, now w CRT-D who has reached ERI and is admitted for generator replacement.  History of permanent atrial fibrillation, nonischemic cardiomyopathy status post AV junction ablation with interval improvement of LV function and a previously implanted CRT-D 2007 with a change 2012, 2016.  The patient denies chest pain, nocturnal dyspnea, orthopnea.  There have been no palpitations, lightheadedness or syncope.  Complains of shortness of breath with exertion--stable  some edema responsive-to diuretics .   History of hyper thyroidism/thyrotoxicosis-treated now with a mildly elevated TSH  Records and Results Reviewed   Past Medical History:  Diagnosis Date   Atrial fibrillation (HCC)    CHF (congestive heart failure) (HCC)    Graves disease    Graves   Hyperlipidemia    Hypertension    OSA treated with BiPAP    Substance abuse ([place])    alcohol    Past Surgical History:  Procedure Laterality Date   APPENDECTOMY     BIV ICD GENERTAOR CHANGE OUT Left 2012   Medtronic Protecta   CARDIAC ELECTROPHYSIOLOGY STUDY AND ABLATION     EP IMPLANTABLE DEVICE N/A 05/06/2015   Procedure: ICD Generator Changeout;  Surgeon: Sanda Aydden Cumpian, MD;  Location: [facility];  Service: Cardiovascular;  Laterality: N/A;   HERNIA REPAIR     MENISCUS REPAIR Right    VASECTOMY     unilateral due to injury    Current Facility-Administered Medications  Medication Dose Route Frequency Provider Last Rate Last Admin   0.9 %  sodium chloride infusion   Intravenous Continuous  Deboraha Sprang, MD 50 mL/hr at 08/11/22 0830 New Bag at 08/11/22 0830   ceFAZolin (ANCEF) IVPB 3g/100 mL premix  3 g Intravenous Once Deboraha Sprang, MD       chlorhexidine (HIBICLENS) 4 % liquid 4 Application  4 Application Topical Once Deboraha Sprang, MD       gentamicin (GARAMYCIN) 80 mg in sodium chloride 0.9 % 500 mL irrigation  80 mg Irrigation On Call Deboraha Sprang, MD        No Known Allergies    Social History   Tobacco Use   Smoking status: Every Day    Packs/day: 0.50    Years: 51.00    Total pack years: 25.50    Types: Cigarettes   Smokeless tobacco: Never   Tobacco comments:    Half of pack daily  Substance Use Topics   Alcohol use: No    Comment: recovering alcoholic   Drug use: No     Family History  Problem Relation Age of Onset   Heart disease Mother    Hypertension Mother    Heart disease Father    Hyperlipidemia Father    Hypertension Father    Heart attack Father    Heart disease Paternal Aunt    Heart disease Paternal Uncle    Alcohol abuse Maternal Uncle    Alcohol abuse Maternal Grandfather      Current Meds  Medication Sig   acetaminophen (TYLENOL) 500 MG tablet Take 1,000 mg by mouth every 6 (six) hours as needed for moderate pain.  ALPRAZolam (XANAX) 0.5 MG tablet Take 1 tablet (0.5 mg total) by mouth 3 (three) times daily as needed for anxiety. (Patient taking differently: Take 0.25 mg by mouth 3 (three) times daily as needed for anxiety.)   Carboxymethylcellulose Sodium (LUBRICANT EYE DROPS OP) Place 1 drop into both eyes at bedtime.   carvedilol (COREG CR) 80 MG 24 hr capsule TAKE 1 CAPSULE DAILY   cholecalciferol (VITAMIN D3) 25 MCG (1000 UNIT) tablet Take 1,000 Units by mouth daily.   empagliflozin (JARDIANCE) 10 MG TABS tablet Take 1 tablet (10 mg total) by mouth daily before breakfast.   furosemide (LASIX) 40 MG tablet Take lasix 40 mg (1 tablet) every other day alternating with 80 mg ( 2 tablets) every other day (Patient taking  differently: Take 40 mg by mouth daily.)   levothyroxine (SYNTHROID, LEVOTHROID) 200 MCG tablet Take 200 mcg by mouth daily before breakfast.   lisinopril (ZESTRIL) 10 MG tablet TAKE 1 TABLET TWICE A DAY   Melatonin 5 MG CAPS Take 5 mg by mouth at bedtime.   Multiple Vitamin (MULTIVITAMIN) tablet Take 1 tablet by mouth daily.   potassium chloride (KLOR-CON) 10 MEQ tablet TAKE 1 TABLET DAILY   PRADAXA 150 MG CAPS capsule TAKE 1 CAPSULE TWICE DAILY   sertraline (ZOLOFT) 50 MG tablet TAKE 1 TABLET DAILY   simvastatin (ZOCOR) 40 MG tablet TAKE 1 TABLET DAILY AT 6 P.M.     Review of Systems negative except from HPI and PMH  Physical Exam BP 101/70   Pulse 87   Temp (!) 97.1 F (36.2 C) (Temporal)   Resp 18   Ht '6\' 1"'$  (1.854 m)   Wt (!) 145.2 kg   SpO2 95%   BMI 42.22 kg/m  Well developed and well nourished in no acute distress HENT normal E scleral and icterus clear Neck Supple Clear to ausculation Regular rate and rhythm, no murmurs gallops or rub Soft with active bowel sounds No clubbing cyanosis  Edema Alert and oriented, grossly normal motor and sensory function Skin Warm and Dry    Assessment and  Plan  Atrial fibrillation permanent  NICM  resolved  AV Ablation   complete heart block  CRT-D  now at [facility] Medtronic   For gen change-- reviewed risks and benefits Decision made to replace HV therapy with HV therapy

## 2022-08-12 ENCOUNTER — Encounter (HOSPITAL_COMMUNITY): Payer: Self-pay | Admitting: Internal Medicine

## 2022-08-18 ENCOUNTER — Telehealth: Payer: Self-pay | Admitting: Cardiovascular Disease

## 2022-08-18 NOTE — Telephone Encounter (Signed)
Patient is calling stating he is needing an email to put on his disability forms for our office. Please advise.

## 2022-08-18 NOTE — Telephone Encounter (Signed)
Patient asked for an email for Dr. Victorino December office. I explained there is no email to refer and verified clinic fax number with patient [phone]).

## 2022-08-24 NOTE — Progress Notes (Signed)
Remote ICD transmission.   

## 2022-08-25 ENCOUNTER — Ambulatory Visit: Payer: Medicare Other | Attending: Internal Medicine

## 2022-08-25 DIAGNOSIS — I5042 Chronic combined systolic (congestive) and diastolic (congestive) heart failure: Secondary | ICD-10-CM | POA: Insufficient documentation

## 2022-08-25 DIAGNOSIS — I442 Atrioventricular block, complete: Secondary | ICD-10-CM | POA: Diagnosis not present

## 2022-08-25 LAB — CUP PACEART INCLINIC DEVICE CHECK
Battery Remaining Longevity: 88 mo
Battery Voltage: 3.11 V
Brady Statistic AP VP Percent: 0 %
Brady Statistic AP VS Percent: 0 %
Brady Statistic AS VP Percent: 0 %
Brady Statistic AS VS Percent: 0 %
Brady Statistic RA Percent Paced: 0 %
Brady Statistic RV Percent Paced: 97.96 %
Date Time Interrogation Session: 20230913155500
HighPow Impedance: 46 Ohm
HighPow Impedance: 69 Ohm
Implantable Lead Implant Date: 20071217
Implantable Lead Implant Date: 20071217
Implantable Lead Implant Date: 20071217
Implantable Lead Location: 753858
Implantable Lead Location: 753859
Implantable Lead Location: 753860
Implantable Lead Model: 4194
Implantable Lead Model: 5076
Implantable Lead Model: 6947
Implantable Pulse Generator Implant Date: 20230830
Lead Channel Impedance Value: 190 Ohm
Lead Channel Impedance Value: 342 Ohm
Lead Channel Impedance Value: 399 Ohm
Lead Channel Impedance Value: 418 Ohm
Lead Channel Impedance Value: 551 Ohm
Lead Channel Impedance Value: 646 Ohm
Lead Channel Pacing Threshold Amplitude: 0.75 V
Lead Channel Pacing Threshold Amplitude: 1.25 V
Lead Channel Pacing Threshold Pulse Width: 0.4 ms
Lead Channel Pacing Threshold Pulse Width: 0.8 ms
Lead Channel Setting Pacing Amplitude: 2 V
Lead Channel Setting Pacing Amplitude: 2.25 V
Lead Channel Setting Pacing Pulse Width: 0.4 ms
Lead Channel Setting Pacing Pulse Width: 0.8 ms
Lead Channel Setting Sensing Sensitivity: 0.3 mV

## 2022-08-25 NOTE — Progress Notes (Signed)
Wound check appointment. Dermabond removed. Wound without redness or edema. Incision edges approximated, wound well healed. Normal device function. Thresholds, sensing, and impedances consistent with implant measurements. Histogram distribution appropriate for patient and level of activity. No mode switches or ventricular arrhythmias noted. Patient educated about wound care and shock plan. ROV in 3 months.

## 2022-08-25 NOTE — Patient Instructions (Signed)
   After Your ICD (Implantable Cardiac Defibrillator)    Monitor your defibrillator site for redness, swelling, and drainage. Call the device clinic at [phone] if you experience these symptoms or fever/chills.  Your incision was closed with Steri-strips or staples:  You may shower 7 days after your procedure and wash your incision with soap and water. Avoid lotions, ointments, or perfumes over your incision until it is well-healed.  You may use a hot tub or a pool after your wound check appointment if the incision is completely closed.   Your ICD is designed to protect you from life threatening heart rhythms. Because of this, you may receive a shock.   1 shock with no symptoms:  Call the office during business hours. 1 shock with symptoms (chest pain, chest pressure, dizziness, lightheadedness, shortness of breath, overall feeling unwell):  Call 911. If you experience 2 or more shocks in 24 hours:  Call 911. If you receive a shock, you should not drive.  [place] DMV - no driving for 6 months if you receive appropriate therapy from your ICD.   ICD Alerts:  Some alerts are vibratory and others beep. These are NOT emergencies. Please call our office to let us know. If this occurs at night or on weekends, it can wait until the next business day. Send a remote transmission.  If your device is capable of reading fluid status (for heart failure), you will be offered monthly monitoring to review this with you.   Remote monitoring is used to monitor your ICD from home. This monitoring is scheduled every 91 days by our office. It allows us to keep an eye on the functioning of your device to ensure it is working properly. You will routinely see your Electrophysiologist annually (more often if necessary).  

## 2022-09-06 NOTE — Telephone Encounter (Signed)
Pt calling for an update on his disability forms

## 2022-09-06 NOTE — Telephone Encounter (Signed)
Called patient, patient states he dropped off forms about 1 week ago- he states they are due on 10/10. However I did notify patient that Dr.Croitoru was out of office in the hospital last week and these forms need to be signed. He states they need them faxed directly to them when completed and he would request to be notified when it is done.   Advised I would route to nurse (who was out of office today).  Patient verbalized understanding

## 2022-09-09 NOTE — Telephone Encounter (Signed)
Patient made aware that forms have been successfully faxed to [employer]. Copy sent to be scanned.

## 2022-09-11 ENCOUNTER — Telehealth: Payer: Self-pay | Admitting: Cardiology

## 2022-09-11 NOTE — Telephone Encounter (Signed)
   The patient called the answering service after-hours today.  Received a page requesting call back to a number/person not listed as an authorized party for medical disclosure. This turned out to be patient's sister calling on his behalf. I relayed my inability to speak with her and called patient directly.   Patient reports that starting around 3:00 am, his ICD has been making intermittent "siren" sounds. He recalls a total of 5 instances. Patient has downloaded device data. I spoke with local medtronic rep who reports that patient's device impedence is increasing. SVC max limit is 100 ohms and it measured 115 ohms this morning (rep also notes this has been increasing, up to 70 average since gen change). His RVD impedence has also reportedly increased since gen change. No report of lead noise. This download will be faxed to the hospital.  I spoke with EP physician Dr. Myles Gip and reviewed this information with him. He reports that this is not an urgent issue. Per our discussion, acute risk is low, will route this information to device clinic for next steps on Monday. I spoke with patient and informed him of this plan. Patient was given ED precautions and confirmed understanding.  Lily Kocher, PA-C

## 2022-09-13 ENCOUNTER — Ambulatory Visit (INDEPENDENT_AMBULATORY_CARE_PROVIDER_SITE_OTHER): Payer: Medicare Other | Admitting: Dermatology

## 2022-09-13 ENCOUNTER — Telehealth: Payer: Self-pay | Admitting: Cardiovascular Disease

## 2022-09-13 ENCOUNTER — Telehealth: Payer: Self-pay

## 2022-09-13 DIAGNOSIS — L820 Inflamed seborrheic keratosis: Secondary | ICD-10-CM

## 2022-09-13 DIAGNOSIS — D229 Melanocytic nevi, unspecified: Secondary | ICD-10-CM | POA: Diagnosis not present

## 2022-09-13 DIAGNOSIS — Z1283 Encounter for screening for malignant neoplasm of skin: Secondary | ICD-10-CM

## 2022-09-13 DIAGNOSIS — L814 Other melanin hyperpigmentation: Secondary | ICD-10-CM | POA: Diagnosis not present

## 2022-09-13 DIAGNOSIS — L918 Other hypertrophic disorders of the skin: Secondary | ICD-10-CM | POA: Diagnosis not present

## 2022-09-13 DIAGNOSIS — L719 Rosacea, unspecified: Secondary | ICD-10-CM

## 2022-09-13 DIAGNOSIS — L821 Other seborrheic keratosis: Secondary | ICD-10-CM | POA: Diagnosis not present

## 2022-09-13 DIAGNOSIS — R238 Other skin changes: Secondary | ICD-10-CM | POA: Diagnosis not present

## 2022-09-13 DIAGNOSIS — D1801 Hemangioma of skin and subcutaneous tissue: Secondary | ICD-10-CM

## 2022-09-13 DIAGNOSIS — L570 Actinic keratosis: Secondary | ICD-10-CM | POA: Diagnosis not present

## 2022-09-13 DIAGNOSIS — Q825 Congenital non-neoplastic nevus: Secondary | ICD-10-CM | POA: Diagnosis not present

## 2022-09-13 DIAGNOSIS — L578 Other skin changes due to chronic exposure to nonionizing radiation: Secondary | ICD-10-CM

## 2022-09-13 NOTE — Telephone Encounter (Signed)
Pt calling about his device firing off this weekend. Pt states his device went of  twice this morning as well. Informed him message was sent to Dr. Loletha Grayer and Dr. Caryl Comes over the weekend.

## 2022-09-13 NOTE — Telephone Encounter (Signed)
Patient schedule for appointment on 10/9 for medication refill.

## 2022-09-13 NOTE — Telephone Encounter (Signed)
Device Clinic apt made 09/14/22 @ 1:45 for when Dr. Caryl Comes is in the office.  Location, date and time made with verbal understanding.

## 2022-09-13 NOTE — Telephone Encounter (Signed)
Patietn called to see if he can get a refill on hid [facility] sent to [employer], and ZOLOFT to [employer], thanks!

## 2022-09-13 NOTE — Patient Instructions (Addendum)
Seborrheic Keratosis  What causes seborrheic keratoses? Seborrheic keratoses are harmless, common skin growths that first appear during adult life.  As time goes by, more growths appear.  Some people may develop a large number of them.  Seborrheic keratoses appear on both covered and uncovered body parts.  They are not caused by sunlight.  The tendency to develop seborrheic keratoses can be inherited.  They vary in color from skin-colored to gray, brown, or even black.  They can be either smooth or have a rough, warty surface.   Seborrheic keratoses are superficial and look as if they were stuck on the skin.  Under the microscope this type of keratosis looks like layers upon layers of skin.  That is why at times the top layer may seem to fall off, but the rest of the growth remains and re-grows.    Treatment Seborrheic keratoses do not need to be treated, but can easily be removed in the office.  Seborrheic keratoses often cause symptoms when they rub on clothing or jewelry.  Lesions can be in the way of shaving.  If they become inflamed, they can cause itching, soreness, or burning.  Removal of a seborrheic keratosis can be accomplished by freezing, burning, or surgery. If any spot bleeds, scabs, or grows rapidly, please return to have it checked, as these can be an indication of a skin cancer.  Cryotherapy Aftercare  Wash gently with soap and water everyday.   Apply Vaseline and Band-Aid daily until healed.   Actinic keratoses are precancerous spots that appear secondary to cumulative UV radiation exposure/sun exposure over time. They are chronic with expected duration over 1 year. A portion of actinic keratoses will progress to squamous cell carcinoma of the skin. It is not possible to reliably predict which spots will progress to skin cancer and so treatment is recommended to prevent development of skin cancer.  Recommend daily broad spectrum sunscreen SPF 30+ to sun-exposed areas, reapply every  2 hours as needed.  Recommend staying in the shade or wearing long sleeves, sun glasses (UVA+UVB protection) and wide brim hats (4-inch brim around the entire circumference of the hat). Call for new or changing lesions.    Melanoma ABCDEs  Melanoma is the most dangerous type of skin cancer, and is the leading cause of death from skin disease.  You are more likely to develop melanoma if you: Have light-colored skin, light-colored eyes, or red or blond hair Spend a lot of time in the sun Tan regularly, either outdoors or in a tanning bed Have had blistering sunburns, especially during childhood Have a close family member who has had a melanoma Have atypical moles or large birthmarks  Early detection of melanoma is key since treatment is typically straightforward and cure rates are extremely high if we catch it early.   The first sign of melanoma is often a change in a mole or a new dark spot.  The ABCDE system is a way of remembering the signs of melanoma.  A for asymmetry:  The two halves do not match. B for border:  The edges of the growth are irregular. C for color:  A mixture of colors are present instead of an even brown color. D for diameter:  Melanomas are usually (but not always) greater than 6mm - the size of a pencil eraser. E for evolution:  The spot keeps changing in size, shape, and color.  Please check your skin once per month between visits. You can use a small   mirror in front and a large mirror behind you to keep an eye on the back side or your body.   If you see any new or changing lesions before your next follow-up, please call to schedule a visit.  Please continue daily skin protection including broad spectrum sunscreen SPF 30+ to sun-exposed areas, reapplying every 2 hours as needed when you're outdoors.   Staying in the shade or wearing long sleeves, sun glasses (UVA+UVB protection) and wide brim hats (4-inch brim around the entire circumference of the hat) are also  recommended for sun protection.     Due to recent changes in healthcare laws, you may see results of your pathology and/or laboratory studies on MyChart before the doctors have had a chance to review them. We understand that in some cases there may be results that are confusing or concerning to you. Please understand that not all results are received at the same time and often the doctors may need to interpret multiple results in order to provide you with the best plan of care or course of treatment. Therefore, we ask that you please give us 2 business days to thoroughly review all your results before contacting the office for clarification. Should we see a critical lab result, you will be contacted sooner.   If You Need Anything After Your Visit  If you have any questions or concerns for your doctor, please call our main line at [phone] and press option 4 to reach your doctor's medical assistant. If no one answers, please leave a voicemail as directed and we will return your call as soon as possible. Messages left after 4 pm will be answered the following business day.   You may also send us a message via MyChart. We typically respond to MyChart messages within 1-2 business days.  For prescription refills, please ask your pharmacy to contact our office. Our fax number is [phone].  If you have an urgent issue when the clinic is closed that cannot wait until the next business day, you can page your doctor at the number below.    Please note that while we do our best to be available for urgent issues outside of office hours, we are not available 24/7.   If you have an urgent issue and are unable to reach us, you may choose to seek medical care at your doctor's office, retail clinic, urgent care center, or emergency room.  If you have a medical emergency, please immediately call 911 or go to the emergency department.  Pager Numbers  - Dr. Kowalski: [phone]  - Dr. Moye:  [phone]  - Dr. Stewart: [phone]  In the event of inclement weather, please call our main line at [phone] for an update on the status of any delays or closures.  Dermatology Medication Tips: Please keep the boxes that topical medications come in in order to help keep track of the instructions about where and how to use these. Pharmacies typically print the medication instructions only on the boxes and not directly on the medication tubes.   If your medication is too expensive, please contact our office at [phone] option 4 or send us a message through MyChart.   We are unable to tell what your co-pay for medications will be in advance as this is different depending on your insurance coverage. However, we may be able to find a substitute medication at lower cost or fill out paperwork to get insurance to cover a needed medication.   If a prior   authorization is required to get your medication covered by your insurance company, please allow us 1-2 business days to complete this process.  Drug prices often vary depending on where the prescription is filled and some pharmacies may offer cheaper prices.  The website www.goodrx.com contains coupons for medications through different pharmacies. The prices here do not account for what the cost may be with help from insurance (it may be cheaper with your insurance), but the website can give you the price if you did not use any insurance.  - You can print the associated coupon and take it with your prescription to the pharmacy.  - You may also stop by our office during regular business hours and pick up a GoodRx coupon card.  - If you need your prescription sent electronically to a different pharmacy, notify our office through [facility] MyChart or by phone at [phone] option 4.     Si Usted Necesita Algo Despus de Su Visita  Tambin puede enviarnos un mensaje a travs de MyChart. Por lo general respondemos a los mensajes de  MyChart en el transcurso de 1 a 2 das hbiles.  Para renovar recetas, por favor pida a su farmacia que se ponga en contacto con nuestra oficina. Nuestro nmero de fax es el [phone].  Si tiene un asunto urgente cuando la clnica est cerrada y que no puede esperar hasta el siguiente da hbil, puede llamar/localizar a su doctor(a) al nmero que aparece a continuacin.   Por favor, tenga en cuenta que aunque hacemos todo lo posible para estar disponibles para asuntos urgentes fuera del horario de oficina, no estamos disponibles las 24 horas del da, los 7 das de la semana.   Si tiene un problema urgente y no puede comunicarse con nosotros, puede optar por buscar atencin mdica  en el consultorio de su doctor(a), en una clnica privada, en un centro de atencin urgente o en una sala de emergencias.  Si tiene una emergencia mdica, por favor llame inmediatamente al 911 o vaya a la sala de emergencias.  Nmeros de bper  - Dr. Kowalski: [phone]  - Dra. Moye: [phone]  - Dra. Stewart: [phone]  En caso de inclemencias del tiempo, por favor llame a nuestra lnea principal al [phone] para una actualizacin sobre el estado de cualquier retraso o cierre.  Consejos para la medicacin en dermatologa: Por favor, guarde las cajas en las que vienen los medicamentos de uso tpico para ayudarle a seguir las instrucciones sobre dnde y cmo usarlos. Las farmacias generalmente imprimen las instrucciones del medicamento slo en las cajas y no directamente en los tubos del medicamento.   Si su medicamento es muy caro, por favor, pngase en contacto con nuestra oficina llamando al [phone] y presione la opcin 4 o envenos un mensaje a travs de MyChart.   No podemos decirle cul ser su copago por los medicamentos por adelantado ya que esto es diferente dependiendo de la cobertura de su seguro. Sin embargo, es posible que podamos encontrar un medicamento sustituto a menor costo o  llenar un formulario para que el seguro cubra el medicamento que se considera necesario.   Si se requiere una autorizacin previa para que su compaa de seguros cubra su medicamento, por favor permtanos de 1 a 2 das hbiles para completar este proceso.  Los precios de los medicamentos varan con frecuencia dependiendo del lugar de dnde se surte la receta y alguna farmacias pueden ofrecer precios ms baratos.  El sitio web www.goodrx.com tiene cupones para   medicamentos de diferentes farmacias. Los precios aqu no tienen en cuenta lo que podra costar con la ayuda del seguro (puede ser ms barato con su seguro), pero el sitio web puede darle el precio si no utiliz ningn seguro.  - Puede imprimir el cupn correspondiente y llevarlo con su receta a la farmacia.  - Tambin puede pasar por nuestra oficina durante el horario de atencin regular y recoger una tarjeta de cupones de GoodRx.  - Si necesita que su receta se enve electrnicamente a una farmacia diferente, informe a nuestra oficina a travs de MyChart de [facility] o por telfono llamando al [phone] y presione la opcin 4.  

## 2022-09-13 NOTE — Progress Notes (Signed)
New Patient Visit  Subjective  Brian Craig is a 71 y.o. male who presents for the following: New Patient (Initial Visit) (Patient here for tbse. Reports lots of moles and skin tags. ). The patient presents for Total-Body Skin Exam (TBSE) for skin cancer screening and mole check.  The patient has spots, moles and lesions to be evaluated, some may be new or changing and the patient has concerns that these could be cancer.  The following portions of the chart were reviewed this encounter and updated as appropriate:   Tobacco  Allergies  Meds  Problems  Med Hx  Surg Hx  Fam Hx     Review of Systems:  No other skin or systemic complaints except as noted in HPI or Assessment and Plan.  Objective  Well appearing patient in no apparent distress; mood and affect are within normal limits.  A full examination was performed including scalp, head, eyes, ears, nose, lips, neck, chest, axillae, abdomen, back, buttocks, bilateral upper extremities, bilateral lower extremities, hands, feet, fingers, toes, fingernails, and toenails. All findings within normal limits unless otherwise noted below.  face Pinkness and thickness at nose and cheeks  left face x 4 (4) Erythematous thin papules/macules with gritty scale.   Left posterior ear x 1 Erythematous stuck-on, waxy papule or plaque  Right Ankle - Posterior Brown patch    Assessment & Plan  Rosacea face Rosacea is a chronic progressive skin condition usually affecting the face of adults, causing redness and/or acne bumps. It is treatable but not curable. It sometimes affects the eyes (ocular rosacea) as well. It may respond to topical and/or systemic medication and can flare with stress, sun exposure, alcohol, exercise, topical steroids (including hydrocortisone/cortisone 10) and some foods.  Daily application of broad spectrum spf 30+ sunscreen to face is recommended to reduce flares.  Discussed treatment Patient deferred treatment at this  time.  Actinic keratosis (4) left face x 4 Actinic keratoses are precancerous spots that appear secondary to cumulative UV radiation exposure/sun exposure over time. They are chronic with expected duration over 1 year. A portion of actinic keratoses will progress to squamous cell carcinoma of the skin. It is not possible to reliably predict which spots will progress to skin cancer and so treatment is recommended to prevent development of skin cancer.  Recommend daily broad spectrum sunscreen SPF 30+ to sun-exposed areas, reapply every 2 hours as needed.  Recommend staying in the shade or wearing long sleeves, sun glasses (UVA+UVB protection) and wide brim hats (4-inch brim around the entire circumference of the hat). Call for new or changing lesions.  Destruction of lesion - left face x 4 Complexity: extensive   Destruction method: electrodesiccation and curettage   Informed consent: discussed and consent obtained   Timeout:  patient name, date of birth, surgical site, and procedure verified Procedure prep:  Patient was prepped and draped in usual sterile fashion Prep type:  Isopropyl alcohol Anesthesia: the lesion was anesthetized in a standard fashion   Anesthetic:  1% lidocaine w/ epinephrine 1-100,000 buffered w/ 8.4% NaHCO3 Curettage performed in three different directions: Yes   Electrodesiccation performed over the curetted area: Yes   Hemostasis achieved with:  pressure, aluminum chloride and electrodesiccation Outcome: patient tolerated procedure well with no complications   Post-procedure details: sterile dressing applied and wound care instructions given   Dressing type: bandage and petrolatum   Additional details:  Prior to procedure, discussed risks of blister formation, small wound, skin dyspigmentation, or rare scar  following cryotherapy. Recommend Vaseline ointment to treated areas while healing.  Inflamed seborrheic keratosis Left posterior ear x 1 Symptomatic,  irritating, patient would like treated. Destruction of lesion - Left posterior ear x 1 Complexity: simple   Destruction method: cryotherapy   Informed consent: discussed and consent obtained   Timeout:  patient name, date of birth, surgical site, and procedure verified Lesion destroyed using liquid nitrogen: Yes   Region frozen until ice ball extended beyond lesion: Yes   Outcome: patient tolerated procedure well with no complications   Post-procedure details: wound care instructions given   Additional details:  Prior to procedure, discussed risks of blister formation, small wound, skin dyspigmentation, or rare scar following cryotherapy. Recommend Vaseline ointment to treated areas while healing.  Venous lake left ear. superior helix Benign-appearing.  Observation.  Call clinic for new or changing lesions.  Recommend daily use of broad spectrum spf 30+ sunscreen to sun-exposed areas.   Vascular birthmark Right Ankle - Posterior Benign, observe.   Lentigines - Scattered tan macules - Due to sun exposure - Benign-appearing, observe - Recommend daily broad spectrum sunscreen SPF 30+ to sun-exposed areas, reapply every 2 hours as needed. - Call for any changes  Seborrheic Keratoses - Stuck-on, waxy, tan-brown papules and/or plaques  - Benign-appearing - Discussed benign etiology and prognosis. - Observe - Call for any changes  Acrochordons (Skin Tags) - Fleshy, skin-colored pedunculated papules - Benign appearing.  - Observe. - If desired, they can be removed with an in office procedure that is not covered by insurance. - Please call the clinic if you notice any new or changing lesions.  Melanocytic Nevi - Tan-brown and/or pink-flesh-colored symmetric macules and papules - Benign appearing on exam today - Observation - Call clinic for new or changing moles - Recommend daily use of broad spectrum spf 30+ sunscreen to sun-exposed areas.   Hemangiomas - Red papules -  Discussed benign nature - Observe - Call for any changes  Actinic Damage - Chronic condition, secondary to cumulative UV/sun exposure - diffuse scaly erythematous macules with underlying dyspigmentation - Recommend daily broad spectrum sunscreen SPF 30+ to sun-exposed areas, reapply every 2 hours as needed.  - Staying in the shade or wearing long sleeves, sun glasses (UVA+UVB protection) and wide brim hats (4-inch brim around the entire circumference of the hat) are also recommended for sun protection.  - Call for new or changing lesions.  Skin cancer screening performed today. Return in about 1 year (around 09/14/2023) for TBSE.  IRuthell Rummage, CMA, am acting as scribe for Sarina Ser, MD. Documentation: I have reviewed the above documentation for accuracy and completeness, and I agree with the above.  Sarina Ser, MD

## 2022-09-14 ENCOUNTER — Ambulatory Visit: Payer: Medicare Other | Attending: Cardiology

## 2022-09-14 DIAGNOSIS — Z95810 Presence of automatic (implantable) cardiac defibrillator: Secondary | ICD-10-CM

## 2022-09-14 DIAGNOSIS — I5042 Chronic combined systolic (congestive) and diastolic (congestive) heart failure: Secondary | ICD-10-CM

## 2022-09-14 DIAGNOSIS — I442 Atrioventricular block, complete: Secondary | ICD-10-CM

## 2022-09-14 LAB — CUP PACEART INCLINIC DEVICE CHECK
Battery Remaining Longevity: 91 mo
Battery Voltage: 3.06 V
Brady Statistic AP VP Percent: 0 %
Brady Statistic AP VS Percent: 0 %
Brady Statistic AS VP Percent: 0 %
Brady Statistic AS VS Percent: 0 %
Brady Statistic RA Percent Paced: 0 %
Brady Statistic RV Percent Paced: 97.52 %
Date Time Interrogation Session: 20231003143651
HighPow Impedance: 127 Ohm
HighPow Impedance: 132 Ohm
Implantable Lead Implant Date: 20071217
Implantable Lead Implant Date: 20071217
Implantable Lead Implant Date: 20071217
Implantable Lead Location: 753858
Implantable Lead Location: 753859
Implantable Lead Location: 753860
Implantable Lead Model: 4194
Implantable Lead Model: 5076
Implantable Lead Model: 6947
Implantable Pulse Generator Implant Date: 20230830
Lead Channel Impedance Value: 399 Ohm
Lead Channel Impedance Value: 418 Ohm
Lead Channel Impedance Value: 418 Ohm
Lead Channel Impedance Value: 551 Ohm
Lead Channel Impedance Value: 703 Ohm
Lead Channel Impedance Value: 817 Ohm
Lead Channel Pacing Threshold Amplitude: 0.625 V
Lead Channel Pacing Threshold Amplitude: 1.375 V
Lead Channel Pacing Threshold Pulse Width: 0.4 ms
Lead Channel Pacing Threshold Pulse Width: 0.8 ms
Lead Channel Setting Pacing Amplitude: 2 V
Lead Channel Setting Pacing Amplitude: 2.5 V
Lead Channel Setting Pacing Pulse Width: 0.4 ms
Lead Channel Setting Pacing Pulse Width: 0.8 ms
Lead Channel Setting Sensing Sensitivity: 0.3 mV

## 2022-09-14 NOTE — Progress Notes (Signed)
In clinic check due to alarm for increased impedance on SVC Defib and RV Defib. SVC Defib programmed off.  Impedance alert for RV Defib increased to 200 ohms. Will continue to monitor with a 2 week and 4 week remote follow up.

## 2022-09-17 ENCOUNTER — Encounter: Payer: Self-pay | Admitting: Dermatology

## 2022-09-20 ENCOUNTER — Ambulatory Visit (INDEPENDENT_AMBULATORY_CARE_PROVIDER_SITE_OTHER): Payer: Medicare Other | Admitting: Physician Assistant

## 2022-09-20 ENCOUNTER — Encounter: Payer: Self-pay | Admitting: Internal Medicine

## 2022-09-20 ENCOUNTER — Encounter: Payer: Self-pay | Admitting: Physician Assistant

## 2022-09-20 ENCOUNTER — Telehealth: Payer: Self-pay

## 2022-09-20 VITALS — BP 119/79 | HR 83 | Temp 97.5°F | Ht 73.0 in | Wt 332.0 lb

## 2022-09-20 DIAGNOSIS — E785 Hyperlipidemia, unspecified: Secondary | ICD-10-CM | POA: Diagnosis not present

## 2022-09-20 DIAGNOSIS — Z125 Encounter for screening for malignant neoplasm of prostate: Secondary | ICD-10-CM | POA: Diagnosis not present

## 2022-09-20 DIAGNOSIS — R7303 Prediabetes: Secondary | ICD-10-CM | POA: Diagnosis not present

## 2022-09-20 DIAGNOSIS — Z720 Tobacco use: Secondary | ICD-10-CM | POA: Diagnosis not present

## 2022-09-20 DIAGNOSIS — I10 Essential (primary) hypertension: Secondary | ICD-10-CM

## 2022-09-20 DIAGNOSIS — F419 Anxiety disorder, unspecified: Secondary | ICD-10-CM | POA: Diagnosis not present

## 2022-09-20 DIAGNOSIS — I5042 Chronic combined systolic (congestive) and diastolic (congestive) heart failure: Secondary | ICD-10-CM

## 2022-09-20 DIAGNOSIS — T82110A Breakdown (mechanical) of cardiac electrode, initial encounter: Secondary | ICD-10-CM

## 2022-09-20 MED ORDER — SERTRALINE HCL 50 MG PO TABS: 50.0000 mg | ORAL_TABLET | Freq: Every day | ORAL | 1 refills | Status: DC

## 2022-09-20 MED ORDER — ALPRAZOLAM 0.5 MG PO TABS: 0.5000 mg | ORAL_TABLET | Freq: Three times a day (TID) | ORAL | 0 refills | Status: DC | PRN

## 2022-09-20 NOTE — Assessment & Plan Note (Signed)
-  Last A1c 5.8, will repeat A1c today. Asymptomatic. Discussed reducing simple carbohydrates/processed foods.

## 2022-09-20 NOTE — Telephone Encounter (Signed)
The patient called back because he did not get a call back. I explained to him that the nurse been seeing back to back patients all day. She is going to call back after her last patient.

## 2022-09-20 NOTE — Telephone Encounter (Signed)
Brian Craig will f/u

## 2022-09-20 NOTE — Telephone Encounter (Signed)
I let the patient know that Sonia Baller, rn spoke with Dr. Caryl Comes and he wants to discuss it with Dr. Lovena Le. Once they come up with a plan, someone will give him a call back.

## 2022-09-20 NOTE — Progress Notes (Signed)
Established patient visit   Patient: Brian Craig   DOB: 1951/10/20   71 y.o. Male  MRN: [id number] Visit Date: 09/20/2022  Chief Complaint  Patient presents with   Follow-up   Subjective    HPI  Patient presents for chronic follow-up visit. Patient reports continues to smoke, no changes with use.  Mood: Reports mood has been stable. States only takes alprazolam when needed for severe anxiety and usually only needs half a tablet of 0.5 mg. Takes sertraline 50 mg daily without issues. No SI/HI.  HTN: Pt denies chest pain, palpitations, dizziness, shortness of breath, syncope or lower extremity swelling. Taking medication as directed without side effects.   HLD: Pt taking medication as directed without issues. No mylagias.  Prediabetes: Denies increased thirst or urination. Patient reports eats 2 meals per day and will eat late night snacks such as cheetos.      09/20/2022   10:34 AM 03/15/2022    9:41 AM 11/11/2021    9:01 AM 05/18/2021    1:40 PM 01/12/2021    2:33 PM  Depression screen PHQ 2/9  Decreased Interest 0 0 0 0 0  Down, Depressed, Hopeless 0 0 0 0 0  PHQ - 2 Score 0 0 0 0 0  Altered sleeping 0 0 0 0 0  Tired, decreased energy 1 0 1 0 0  Change in appetite 1 1 0 0 0  Feeling bad or failure about yourself  0 0 0 0 0  Trouble concentrating 0 0 0 0 0  Moving slowly or fidgety/restless 0 0 0 0 0  Suicidal thoughts 0 0 0 0 0  PHQ-9 Score 2 1 1  0 0  Difficult doing work/chores Not difficult at all Not difficult at all         09/20/2022   10:34 AM 03/15/2022    9:41 AM 11/11/2021    9:01 AM  GAD 7 : Generalized Anxiety Score  Nervous, Anxious, on Edge 1 0 0  Control/stop worrying 0 0 0  Worry too much - different things 0 0 0  Trouble relaxing 0 0 0  Restless 0 0 0  Easily annoyed or irritable 0 0 0  Afraid - awful might happen 0 0 0  Total GAD 7 Score 1 0 0  Anxiety Difficulty Not difficult at all Not difficult at all         Medications: Outpatient  Medications Prior to Visit  Medication Sig   acetaminophen (TYLENOL) 500 MG tablet Take 1,000 mg by mouth every 6 (six) hours as needed for moderate pain.   Carboxymethylcellulose Sodium (LUBRICANT EYE DROPS OP) Place 1 drop into both eyes at bedtime.   carvedilol (COREG CR) 80 MG 24 hr capsule TAKE 1 CAPSULE DAILY   cholecalciferol (VITAMIN D3) 25 MCG (1000 UNIT) tablet Take 1,000 Units by mouth daily.   empagliflozin (JARDIANCE) 10 MG TABS tablet Take 1 tablet (10 mg total) by mouth daily before breakfast.   furosemide (LASIX) 40 MG tablet Take lasix 40 mg (1 tablet) every other day alternating with 80 mg ( 2 tablets) every other day (Patient taking differently: Take 40 mg by mouth daily.)   levothyroxine (SYNTHROID, LEVOTHROID) 200 MCG tablet Take 200 mcg by mouth daily before breakfast.   lisinopril (ZESTRIL) 10 MG tablet TAKE 1 TABLET TWICE A DAY   Melatonin 5 MG CAPS Take 5 mg by mouth at bedtime.   Multiple Vitamin (MULTIVITAMIN) tablet Take 1 tablet by mouth daily.   potassium chloride (  KLOR-CON) 10 MEQ tablet TAKE 1 TABLET DAILY   PRADAXA 150 MG CAPS capsule TAKE 1 CAPSULE TWICE DAILY   simvastatin (ZOCOR) 40 MG tablet TAKE 1 TABLET DAILY AT 6 P.M.   [DISCONTINUED] ALPRAZolam (XANAX) 0.5 MG tablet Take 1 tablet (0.5 mg total) by mouth 3 (three) times daily as needed for anxiety. (Patient taking differently: Take 0.25 mg by mouth 3 (three) times daily as needed for anxiety.)   [DISCONTINUED] sertraline (ZOLOFT) 50 MG tablet TAKE 1 TABLET DAILY   No facility-administered medications prior to visit.    Review of Systems Review of Systems:  A fourteen system review of systems was performed and found to be positive as per HPI.  Last CBC Lab Results  Component Value Date   WBC 8.5 08/05/2022   HGB 16.5 08/05/2022   HCT 47.3 08/05/2022   MCV 96 08/05/2022   MCH 33.3 (H) 08/05/2022   RDW 12.1 08/05/2022   PLT 222 62/02/5596   Last metabolic panel Lab Results  Component Value  Date   GLUCOSE 99 08/05/2022   NA 142 08/05/2022   K 4.6 08/05/2022   CL 103 08/05/2022   CO2 25 08/05/2022   BUN 10 08/05/2022   CREATININE 1.00 08/05/2022   EGFR 81 08/05/2022   CALCIUM 9.3 08/05/2022   PROT 6.8 08/05/2022   ALBUMIN 4.3 08/05/2022   LABGLOB 2.5 08/05/2022   AGRATIO 1.7 08/05/2022   BILITOT 0.5 08/05/2022   ALKPHOS 87 08/05/2022   AST 17 08/05/2022   ALT 15 08/05/2022   Last lipids Lab Results  Component Value Date   CHOL 99 (L) 03/15/2022   HDL 32 (L) 03/15/2022   LDLCALC 42 03/15/2022   TRIG 146 03/15/2022   CHOLHDL 3.1 03/15/2022   Last hemoglobin A1c Lab Results  Component Value Date   HGBA1C 5.8 (H) 03/15/2022   Last thyroid functions Lab Results  Component Value Date   TSH 6.310 (H) 08/05/2022   T3TOTAL 84 06/30/2021       Objective    BP 119/79   Pulse 83   Temp (!) 97.5 F (36.4 C) (Temporal)   Ht 6' 1"  (1.854 m)   Wt (!) 332 lb (150.6 kg)   BMI 43.80 kg/m  BP Readings from Last 3 Encounters:  09/20/22 119/79  08/11/22 118/74  06/07/22 104/70   Wt Readings from Last 3 Encounters:  09/20/22 (!) 332 lb (150.6 kg)  08/11/22 (!) 320 lb (145.2 kg)  06/07/22 (!) 335 lb 9.6 oz (152.2 kg)    Physical Exam  General:  Pleasant and cooperative, appropriate for stated age.  Neuro:  Alert and oriented,  extra-ocular muscles intact  HEENT:  Normocephalic, atraumatic, neck supple  Skin:  no gross rash, warm, pink. Cardiac:  RRR, S1 S2 wnl's Respiratory: CTA B/L  Vascular:  Ext warm, no cyanosis apprec.; cap RF less 2 sec. Psych:  No HI/SI, judgement and insight good, Euthymic mood. Full Affect.   No results found for any visits on 09/20/22.  Assessment & Plan      Problem List Items Addressed This Visit       Cardiovascular and Mediastinum   Essential hypertension    -Controlled. Continue carvedilol 80 mg and lisinopril 10 mg. Will collect CMP for medication monitoring.      Relevant Orders   Comp Met (CMET)     Other    Tobacco abuse    -Recommend to reduce tobacco use and eventually be able to quit.  Anxiety - Primary    -Stable. Continue sertraline 50 mg daily. PDMP reviewed, no aberrancies noted. Provided refill of alprazolam 0.5 mg to take TID as needed for anxiety. Controlled substance contract on file.       Relevant Medications   sertraline (ZOLOFT) 50 MG tablet   ALPRAZolam (XANAX) 0.5 MG tablet   Prediabetes    -Last A1c 5.8, will repeat A1c today. Asymptomatic. Discussed reducing simple carbohydrates/processed foods.      Relevant Orders   Comp Met (CMET)   HgB A1c   Other Visit Diagnoses     Hyperlipidemia, unspecified hyperlipidemia type       Screening for prostate cancer       Relevant Orders   PSA      Hyperlipidemia: -Last LDL 42, HDL 32. Continue simvastatin 40 mg daily. Recommend repeating fasting lipid panel in 4 months. Recommend to follow a heart healthy diet and stay as active as possible.   Patient requesting PSA for prostate cancer screening, patient is aware of possible out-of-pocket cost if lab not covered by insurance. Pt verbalized understanding.  Return in about 2 months (around 11/20/2022) for Medicare Wellness (ok for telehealth).        Lorrene Reid, PA-C  [facility] [phone] (phone) [phone] (fax)  [place]

## 2022-09-20 NOTE — Progress Notes (Signed)
Patient with elevated high-voltage impedance on both the proximal coil and the distal coil noted following generator replacement.  Low voltage measurements remain stable.  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  With involvement of both of high-voltage electrodes, it must be that the problem is in the lead portion distal to the yoke, which means that the rate sense portion is also potentially at risk.  Will review with colleagues

## 2022-09-20 NOTE — Assessment & Plan Note (Signed)
-  Controlled. Continue carvedilol 80 mg and lisinopril 10 mg. Will collect CMP for medication monitoring.

## 2022-09-20 NOTE — Telephone Encounter (Signed)
The patient states his ICD is alarming again. I let him know that [name] is going to discuss it with Dr. Caryl Comes and give him a call back at [phone].

## 2022-09-20 NOTE — Assessment & Plan Note (Signed)
-  Recommend to reduce tobacco use and eventually be able to quit.

## 2022-09-20 NOTE — Assessment & Plan Note (Signed)
-  Stable. Continue sertraline 50 mg daily. PDMP reviewed, no aberrancies noted. Provided refill of alprazolam 0.5 mg to take TID as needed for anxiety. Controlled substance contract on file.

## 2022-09-21 ENCOUNTER — Ambulatory Visit
Admission: RE | Admit: 2022-09-21 | Discharge: 2022-09-21 | Disposition: A | Discharge status: Home / Self Care | Payer: Medicare Other | Source: Ambulatory Visit | Attending: Internal Medicine | Admitting: Internal Medicine

## 2022-09-21 ENCOUNTER — Ambulatory Visit: Payer: Medicare Other | Attending: Internal Medicine | Admitting: Internal Medicine

## 2022-09-21 ENCOUNTER — Encounter: Payer: Self-pay | Admitting: Internal Medicine

## 2022-09-21 DIAGNOSIS — M47814 Spondylosis without myelopathy or radiculopathy, thoracic region: Secondary | ICD-10-CM | POA: Diagnosis not present

## 2022-09-21 DIAGNOSIS — I5042 Chronic combined systolic (congestive) and diastolic (congestive) heart failure: Secondary | ICD-10-CM

## 2022-09-21 DIAGNOSIS — I442 Atrioventricular block, complete: Secondary | ICD-10-CM | POA: Diagnosis not present

## 2022-09-21 DIAGNOSIS — T82110A Breakdown (mechanical) of cardiac electrode, initial encounter: Secondary | ICD-10-CM

## 2022-09-21 DIAGNOSIS — I700 Atherosclerosis of aorta: Secondary | ICD-10-CM | POA: Diagnosis not present

## 2022-09-21 DIAGNOSIS — Z95810 Presence of automatic (implantable) cardiac defibrillator: Secondary | ICD-10-CM | POA: Diagnosis not present

## 2022-09-21 LAB — CUP PACEART INCLINIC DEVICE CHECK
Date Time Interrogation Session: 20231010164921
Implantable Lead Implant Date: 20071217
Implantable Lead Implant Date: 20071217
Implantable Lead Implant Date: 20071217
Implantable Lead Location: 753858
Implantable Lead Location: 753859
Implantable Lead Location: 753860
Implantable Lead Model: 4194
Implantable Lead Model: 5076
Implantable Lead Model: 6947
Implantable Pulse Generator Implant Date: 20230830

## 2022-09-21 LAB — COMPREHENSIVE METABOLIC PANEL
ALT: 15 IU/L (ref 0–44)
AST: 18 IU/L (ref 0–40)
Albumin/Globulin Ratio: 1.5 (ref 1.2–2.2)
Albumin: 4.3 g/dL (ref 3.9–4.9)
Alkaline Phosphatase: 98 IU/L (ref 44–121)
BUN/Creatinine Ratio: 13 (ref 10–24)
BUN: 13 mg/dL (ref 8–27)
Bilirubin Total: 0.7 mg/dL (ref 0.0–1.2)
CO2: 26 mmol/L (ref 20–29)
Calcium: 9.4 mg/dL (ref 8.6–10.2)
Chloride: 100 mmol/L (ref 96–106)
Creatinine, Ser: 1.03 mg/dL (ref 0.76–1.27)
Globulin, Total: 2.8 g/dL (ref 1.5–4.5)
Glucose: 103 mg/dL — ABNORMAL HIGH (ref 70–99)
Potassium: 4.1 mmol/L (ref 3.5–5.2)
Sodium: 140 mmol/L (ref 134–144)
Total Protein: 7.1 g/dL (ref 6.0–8.5)
eGFR: 78 mL/min/{1.73_m2} (ref >59)

## 2022-09-21 LAB — PSA: Prostate Specific Ag, Serum: 1.4 ng/mL (ref 0.0–4.0)

## 2022-09-21 LAB — HEMOGLOBIN A1C
Est. average glucose Bld gHb Est-mCnc: 117 mg/dL
Hgb A1c MFr Bld: 5.7 % — ABNORMAL HIGH (ref 4.8–5.6)

## 2022-09-21 NOTE — Telephone Encounter (Signed)
Discussed RV/SVC lead issue with Dr. Lovena Le.  Per Dr. Grayland Ormond chest xray and schedule f/u with him today at 4:15 pm.  Pt advised of above.  He is in agreement.

## 2022-09-21 NOTE — Progress Notes (Signed)
HPI Brian Craig is referred by Dr. Renaldo Reel for consideration for ICD lead extraction. The patient is a very pleasant 71 yo man with chronic systolic heart failure and a tachy induced CM due to uncontrolled atrial fib who underwent Biv PPM and then ICD gen change out a few weeks ago. The patient had an alarm on his SVC coil and then on the RV coil and his LV lead also found to have an elevated pacing impedence. The patient has never had an ICD therapy. His EF has normalized. The patient feels well.  No Known Allergies   Current Outpatient Medications  Medication Sig Dispense Refill   acetaminophen (TYLENOL) 500 MG tablet Take 1,000 mg by mouth every 6 (six) hours as needed for moderate pain.     ALPRAZolam (XANAX) 0.5 MG tablet Take 1 tablet (0.5 mg total) by mouth 3 (three) times daily as needed for anxiety. 30 tablet 0   Carboxymethylcellulose Sodium (LUBRICANT EYE DROPS OP) Place 1 drop into both eyes at bedtime.     carvedilol (COREG CR) 80 MG 24 hr capsule TAKE 1 CAPSULE DAILY 90 capsule 3   cholecalciferol (VITAMIN D3) 25 MCG (1000 UNIT) tablet Take 1,000 Units by mouth daily.     empagliflozin (JARDIANCE) 10 MG TABS tablet Take 1 tablet (10 mg total) by mouth daily before breakfast. 90 tablet 1   furosemide (LASIX) 40 MG tablet Take lasix 40 mg (1 tablet) every other day alternating with 80 mg ( 2 tablets) every other day (Patient taking differently: Take 40 mg by mouth daily.) 180 tablet 3   levothyroxine (SYNTHROID, LEVOTHROID) 200 MCG tablet Take 200 mcg by mouth daily before breakfast.     lisinopril (ZESTRIL) 10 MG tablet TAKE 1 TABLET TWICE A DAY 180 tablet 3   Melatonin 5 MG CAPS Take 5 mg by mouth at bedtime.     Multiple Vitamin (MULTIVITAMIN) tablet Take 1 tablet by mouth daily.     potassium chloride (KLOR-CON) 10 MEQ tablet TAKE 1 TABLET DAILY 90 tablet 3   PRADAXA 150 MG CAPS capsule TAKE 1 CAPSULE TWICE DAILY 180 capsule 3   sertraline (ZOLOFT) 50 MG tablet Take 1 tablet  (50 mg total) by mouth daily. 90 tablet 1   simvastatin (ZOCOR) 40 MG tablet TAKE 1 TABLET DAILY AT 6 P.M. 90 tablet 3   No current facility-administered medications for this visit.     Past Medical History:  Diagnosis Date   Atrial fibrillation (HCC)    CHF (congestive heart failure) (HCC)    Graves disease    Graves   Hyperlipidemia    Hypertension    OSA treated with BiPAP    Substance abuse ([place])    alcohol    ROS:   All systems reviewed and negative except as noted in the HPI.   Past Surgical History:  Procedure Laterality Date   APPENDECTOMY     BIV ICD GENERATOR CHANGEOUT N/A 08/11/2022   Procedure: BIV ICD GENERATOR CHANGEOUT;  Surgeon: Deboraha Sprang, MD;  Location: [place] [facility];  Service: Cardiovascular;  Laterality: N/A;   BIV ICD GENERTAOR CHANGE OUT Left 2012   Medtronic Protecta   CARDIAC ELECTROPHYSIOLOGY STUDY AND ABLATION     EP IMPLANTABLE DEVICE N/A 05/06/2015   Procedure: ICD Generator Changeout;  Surgeon: Sanda Klein, MD;  Location: [place] [facility];  Service: Cardiovascular;  Laterality: N/A;   HERNIA REPAIR     MENISCUS REPAIR Right  VASECTOMY     unilateral due to injury     Family History  Problem Relation Age of Onset   Heart disease Mother    Hypertension Mother    Heart disease Father    Hyperlipidemia Father    Hypertension Father    Heart attack Father    Heart disease Paternal Aunt    Heart disease Paternal Uncle    Alcohol abuse Maternal Uncle    Alcohol abuse Maternal Grandfather      Social History   Socioeconomic History   Marital status: Married    Spouse name: Not on file   Number of children: Not on file   Years of education: Not on file   Highest education level: Not on file  Occupational History   Not on file  Tobacco Use   Smoking status: Every Day    Packs/day: 0.50    Years: 51.00    Total pack years: 25.50    Types: Cigarettes   Smokeless tobacco: Never   Tobacco comments:    Half of  pack daily  Substance and Sexual Activity   Alcohol use: No    Comment: recovering alcoholic   Drug use: No   Sexual activity: Yes    Birth control/protection: None  Other Topics Concern   Not on file  Social History Narrative   Not on file   Social Determinants of Health   Financial Resource Strain: Not on file  Food Insecurity: Not on file  Transportation Needs: Not on file  Physical Activity: Not on file  Stress: Not on file  Social Connections: Not on file  Intimate Partner Violence: Not on file     BP 108/68   Pulse 89   Ht '6\' 1"'$  (1.854 m)   Wt (!) 332 lb (150.6 kg)   SpO2 95%   BMI 43.80 kg/m   Physical Exam:  Well appearing NAD HEENT: Unremarkable Neck:  No JVD, no thyromegally Lymphatics:  No adenopathy Back:  No CVA tenderness Lungs:  Clear with no wheezes HEART:  Regular rate rhythm, no murmurs, no rubs, no clicks Abd:  soft, positive bowel sounds, no organomegally, no rebound, no guarding Ext:  2 plus pulses, no edema, no cyanosis, no clubbing Skin:  No rashes no nodules Neuro:  CN II through XII intact, motor grossly intact  DEVICE  Elevated RV coil impedence. LV tip to ring with normal threshold and impedence.   Assess/Plan:  ICD shocking coil elevated impedence - I have reviewed the connections and can see no abnormality. Unclear where the break is but it involves the high voltage portion of the lead only. We have reprogrammed the device. We will make him a Biv PPM programmed VVIR (chronic atrial fib) as his EF normalized and he did not ever have any ICD therapies. Atrial fib - he has no escape s/p remote AV node ablation. Coags - no bleeding on pradaxa. Obesity - he needs to work on weight loss.  CHB - I have warned that patient that if his RV rate sense portion of the lead were to malfunction than ICD lead removal and insertion of a RV pacing lead would be recommended.  Ane Payment, MD

## 2022-09-21 NOTE — Patient Instructions (Addendum)
Medication Instructions:  Your physician recommends that you continue on your current medications as directed. Please refer to the Current Medication list given to you today.  *If you need a refill on your cardiac medications before your next appointment, please call your pharmacy*  Lab Work: None ordered.  If you have labs (blood work) drawn today and your tests are completely normal, you will receive your results only by: [place] (if you have MyChart) OR A paper copy in the mail If you have any lab test that is abnormal or we need to change your treatment, we will call you to review the results.  Testing/Procedures: None ordered.  Follow-Up:  You will follow up with Dr. Lovena Le AS NEEDED.

## 2022-09-28 ENCOUNTER — Ambulatory Visit (INDEPENDENT_AMBULATORY_CARE_PROVIDER_SITE_OTHER): Payer: Medicare Other

## 2022-09-28 DIAGNOSIS — I442 Atrioventricular block, complete: Secondary | ICD-10-CM

## 2022-09-28 LAB — CUP PACEART REMOTE DEVICE CHECK
Battery Remaining Longevity: 84 mo
Battery Voltage: 3.05 V
Brady Statistic AP VP Percent: 0 %
Brady Statistic AP VS Percent: 0 %
Brady Statistic AS VP Percent: 0 %
Brady Statistic AS VS Percent: 0 %
Brady Statistic RA Percent Paced: 0 %
Brady Statistic RV Percent Paced: 97.79 %
Date Time Interrogation Session: 20231017033523
HighPow Impedance: 72 Ohm
HighPow Impedance: 88 Ohm
Implantable Lead Implant Date: 20071217
Implantable Lead Implant Date: 20071217
Implantable Lead Implant Date: 20071217
Implantable Lead Location: 753858
Implantable Lead Location: 753859
Implantable Lead Location: 753860
Implantable Lead Model: 4194
Implantable Lead Model: 5076
Implantable Lead Model: 6947
Implantable Pulse Generator Implant Date: 20230830
Lead Channel Impedance Value: 1197 Ohm
Lead Channel Impedance Value: 361 Ohm
Lead Channel Impedance Value: 399 Ohm
Lead Channel Impedance Value: 665 Ohm
Lead Channel Impedance Value: 874 Ohm
Lead Channel Impedance Value: 931 Ohm
Lead Channel Pacing Threshold Amplitude: 0.75 V
Lead Channel Pacing Threshold Amplitude: 1.375 V
Lead Channel Pacing Threshold Pulse Width: 0.4 ms
Lead Channel Pacing Threshold Pulse Width: 0.8 ms
Lead Channel Setting Pacing Amplitude: 2.5 V
Lead Channel Setting Pacing Amplitude: 2.5 V
Lead Channel Setting Pacing Pulse Width: 0.4 ms
Lead Channel Setting Pacing Pulse Width: 0.8 ms
Lead Channel Setting Sensing Sensitivity: 0.3 mV

## 2022-10-08 ENCOUNTER — Telehealth: Payer: Self-pay | Admitting: *Deleted

## 2022-10-08 NOTE — Telephone Encounter (Signed)
Left a message for the patient to call back concerning his forms he needs filled out.

## 2022-10-12 ENCOUNTER — Ambulatory Visit: Payer: Medicare Other | Attending: Cardiovascular Disease

## 2022-10-12 DIAGNOSIS — I442 Atrioventricular block, complete: Secondary | ICD-10-CM

## 2022-10-12 DIAGNOSIS — Z95810 Presence of automatic (implantable) cardiac defibrillator: Secondary | ICD-10-CM | POA: Diagnosis not present

## 2022-10-12 LAB — CUP PACEART REMOTE DEVICE CHECK
Battery Remaining Longevity: 83 mo
Battery Voltage: 3.06 V
Brady Statistic AP VP Percent: 0 %
Brady Statistic AP VS Percent: 0 %
Brady Statistic AS VP Percent: 0 %
Brady Statistic AS VS Percent: 0 %
Brady Statistic RA Percent Paced: 0 %
Brady Statistic RV Percent Paced: 98.04 %
Date Time Interrogation Session: 20231031022724
HighPow Impedance: 78 Ohm
HighPow Impedance: 89 Ohm
Implantable Lead Connection Status: 753985
Implantable Lead Connection Status: 753985
Implantable Lead Connection Status: 753985
Implantable Lead Implant Date: 20071217
Implantable Lead Implant Date: 20071217
Implantable Lead Implant Date: 20071217
Implantable Lead Location: 753858
Implantable Lead Location: 753859
Implantable Lead Location: 753860
Implantable Lead Model: 4194
Implantable Lead Model: 5076
Implantable Lead Model: 6947
Implantable Pulse Generator Implant Date: 20230830
Lead Channel Impedance Value: 1235 Ohm
Lead Channel Impedance Value: 342 Ohm
Lead Channel Impedance Value: 399 Ohm
Lead Channel Impedance Value: 703 Ohm
Lead Channel Impedance Value: 836 Ohm
Lead Channel Impedance Value: 931 Ohm
Lead Channel Pacing Threshold Amplitude: 0.875 V
Lead Channel Pacing Threshold Amplitude: 1.125 V
Lead Channel Pacing Threshold Pulse Width: 0.4 ms
Lead Channel Pacing Threshold Pulse Width: 0.8 ms
Lead Channel Setting Pacing Amplitude: 2.5 V
Lead Channel Setting Pacing Amplitude: 2.5 V
Lead Channel Setting Pacing Pulse Width: 0.4 ms
Lead Channel Setting Pacing Pulse Width: 0.8 ms
Lead Channel Setting Sensing Sensitivity: 0.3 mV

## 2022-10-13 NOTE — Progress Notes (Signed)
Remote ICD transmission.   

## 2022-10-15 NOTE — Telephone Encounter (Signed)
The patient has been made aware that the forms have been faxed for him and sent to be scanned

## 2022-10-27 NOTE — Progress Notes (Signed)
Remote ICD transmission.   

## 2022-11-08 ENCOUNTER — Encounter: Payer: Self-pay | Admitting: Cardiovascular Disease

## 2022-11-08 ENCOUNTER — Ambulatory Visit: Payer: Medicare Other | Attending: Cardiovascular Disease | Admitting: Cardiovascular Disease

## 2022-11-08 VITALS — BP 110/64 | HR 74 | Ht 72.0 in | Wt 340.2 lb

## 2022-11-08 DIAGNOSIS — Z7901 Long term (current) use of anticoagulants: Secondary | ICD-10-CM

## 2022-11-08 DIAGNOSIS — I4821 Permanent atrial fibrillation: Secondary | ICD-10-CM

## 2022-11-08 DIAGNOSIS — I442 Atrioventricular block, complete: Secondary | ICD-10-CM | POA: Diagnosis not present

## 2022-11-08 DIAGNOSIS — Z95810 Presence of automatic (implantable) cardiac defibrillator: Secondary | ICD-10-CM | POA: Diagnosis not present

## 2022-11-08 DIAGNOSIS — G4733 Obstructive sleep apnea (adult) (pediatric): Secondary | ICD-10-CM | POA: Diagnosis not present

## 2022-11-08 DIAGNOSIS — I5032 Chronic diastolic (congestive) heart failure: Secondary | ICD-10-CM | POA: Diagnosis not present

## 2022-11-08 NOTE — Progress Notes (Addendum)
Cardiology Office Note    Date:  11/19/2022   ID:  Brian Craig, DOB [birth year], MRN [id number]  PCP:  Lorrene Reid, PA-C  Cardiologist:  Shelva Majestic, MD (sleep); Dr. Sallyanne Kuster  Sleep consult referred by Dr. Recardo Evangelist  History of Present Illness:  Brian Craig is a 71 y.o. male is followed by Dr. Sallyanne Kuster for cardiology disease.  He has a history of severe sleep apnea and has been on BiPAP therapy.  I have not seen him since November 2018.  He recently was evaluated by Dr. Sallyanne Kuster oh and had complaints of recent fatigue and hypersomnolence despite being compliant with BiPAP therapy.  He now presents for sleep consultation and evaluation.  Brian Craig has a history of prior severe cardiomyopathy related to uncontrolled atrial fibrillation with rapid ventricular response that started in the setting of thyrotoxicosis.  His left ventricle ejection fraction had been as low as 17%.  He on underwent AV node ablation and received a biventricular pacemaker/defibrillator in 2007.  In 2007.  Nocturia was also evaluated in [place], [place] for sleep apnea, which was felt to be severe.  Although I do not have these records, I suspect he may of had significant central apneic events in the setting of his LV dysfunction such that since 2007.  He has been using a ResMed VPAP adapt SV unit.  He admits to 100% compliance and cannot sleep without it.  Recently, his 71 year old adapt servoventilation unit has begun to malfunction.  He has wire fragments and has been working intermittently.  He uses a fullface mask.  He typically goes to bed at midnight and wakes up at 8 AM.  Previously, he had awakened frequently with nocturia, but with treatment.  He only wakes up 1 time per night for urination.  Since 2007, he admits to a 30 pound weight gain.  His LV function has improved and reportedly had increased to a proximally 50%, but he has not had a recent echo.  With his recent machine malfunction, he now  presents for sleep evaluation in order to obtain a new unit for treatment of his obstructive sleep apnea.  He has been under the [facility] which is is in [place].  He had not established with the DME company locally despite living in [place]  since 2015.  Typically he is in bed by midnight and wakes up at 8 AM.  He admits to 100% compliance.  When I initially saw him in May 2018 an Epworth scale was calculated in the office as shown below:  Epworth Sleepiness Scale: Situation   Chance of Dozing/Sleeping (0 = never , 1 = slight chance , 2 = moderate chance , 3 = high chance )   sitting and reading 0   watching TV 1   sitting inactive in a public place 0   being a passenger in a motor vehicle for an hour or more 0   lying down in the afternoon 3   sitting and talking to someone 0   sitting quietly after lunch (no alcohol) 0   while stopped for a few minutes in traffic as the driver 0   Total Score  4   He underwent a follow-up sleep study on 05/12/2017.  He had severe obstructive sleep apnea with an HI 57.7 per hour and RDI of 71.9 per hour.  Events were worse with supine posture with an HI of 71.2 per hour.  He had reduced sleep efficiency of  49.4% and as result, was unable to do a split-night protocol.  Oxygen desaturated to 88%.  There was moderate snoring.  On 07/19/2017 he underwent CPAP titration and required BiPAP therapy titrated to 16/12.  He had significant periodic limb movement disorder of sleep with an index of 112.  When I had seen him in 2018 a download demonstrated excellent compliance with AHI of 1.3.  He was consistently using CPAP 100% of the time with average use of 8 hours and 31 minutes.  An Epworth scale was 1 with therapy argue against residual daytime sleepiness.  Over the last 5 years, [name] has been followed by Dr. Sallyanne Kuster.  He has a history of complete heart block, status post AV node ablation and is pacemaker dependent.  He has been maintained on  anticoagulation therapy with his CHA2DS2-VASc score of 3.  On August 11, 2022 he underwent BiV ICD generator change out by Dr. Sallyanne Kuster due to device battery depletion.  his LV function had recovered on maximum tolerated doses of ACE inhibitor carvedilol, and Jardiance.  Occasionally he was requiring an extra dose of diuretic for volume overload.  Recently, he has noticed more fatigability.  He has continued to use his old BiPAP unit has a set up date of August 22, 2017 and is a ResMed air curve 10 via auto unit.  A download was obtained from October 24 through November 03, 2022 which shows 100% device used.  Average use was 9 hours and 9 minutes per night.  His BiPAP was set at a pressure range of 16/12.  AHI was 2.5.  He has been using a Quatro fullface mask.  An Epworth Sleepiness Scale score was calculated in the office today and this endorsed 3.  He continues to have issues with morbid obesity with BMI of 46.14.  He has a history of hypothyroidism.  He presents for new sleep evaluation.   Past Medical History:  Diagnosis Date   Atrial fibrillation ([place])    CHF (congestive heart failure) ([place])    Graves disease    Graves   Hyperlipidemia    Hypertension    OSA treated with BiPAP    Substance abuse ([place])    alcohol    Past Surgical History:  Procedure Laterality Date   APPENDECTOMY     BIV ICD GENERATOR CHANGEOUT N/A 08/11/2022   Procedure: BIV ICD GENERATOR CHANGEOUT;  Surgeon: Deboraha Sprang, MD;  Location: [facility];  Service: Cardiovascular;  Laterality: N/A;   BIV ICD GENERTAOR CHANGE OUT Left 2012   Medtronic Protecta   CARDIAC ELECTROPHYSIOLOGY STUDY AND ABLATION     EP IMPLANTABLE DEVICE N/A 05/06/2015   Procedure: ICD Generator Changeout;  Surgeon: Sanda Klein, MD;  Location: [facility];  Service: Cardiovascular;  Laterality: N/A;   HERNIA REPAIR     MENISCUS REPAIR Right    VASECTOMY     unilateral due to injury    Current Medications: Outpatient  Medications Prior to Visit  Medication Sig Dispense Refill   acetaminophen (TYLENOL) 500 MG tablet Take 1,000 mg by mouth every 6 (six) hours as needed for moderate pain.     ALPRAZolam (XANAX) 0.5 MG tablet Take 1 tablet (0.5 mg total) by mouth 3 (three) times daily as needed for anxiety. 30 tablet 0   Carboxymethylcellulose Sodium (LUBRICANT EYE DROPS OP) Place 1 drop into both eyes at bedtime.     carvedilol (COREG CR) 80 MG 24 hr capsule TAKE 1 CAPSULE DAILY  90 capsule 3   cholecalciferol (VITAMIN D3) 25 MCG (1000 UNIT) tablet Take 1,000 Units by mouth daily.     furosemide (LASIX) 40 MG tablet Take lasix 40 mg (1 tablet) every other day alternating with 80 mg ( 2 tablets) every other day (Patient taking differently: Take 40 mg by mouth daily.) 180 tablet 3   levothyroxine (SYNTHROID, LEVOTHROID) 200 MCG tablet Take 200 mcg by mouth daily before breakfast.     lisinopril (ZESTRIL) 10 MG tablet TAKE 1 TABLET TWICE A DAY 180 tablet 3   Melatonin 5 MG CAPS Take 5 mg by mouth at bedtime.     Multiple Vitamin (MULTIVITAMIN) tablet Take 1 tablet by mouth daily.     potassium chloride (KLOR-CON) 10 MEQ tablet TAKE 1 TABLET DAILY 90 tablet 3   PRADAXA 150 MG CAPS capsule TAKE 1 CAPSULE TWICE DAILY 180 capsule 3   sertraline (ZOLOFT) 50 MG tablet Take 1 tablet (50 mg total) by mouth daily. 90 tablet 1   simvastatin (ZOCOR) 40 MG tablet TAKE 1 TABLET DAILY AT 6 P.M. 90 tablet 3   empagliflozin (JARDIANCE) 10 MG TABS tablet Take 1 tablet (10 mg total) by mouth daily before breakfast. 90 tablet 1   No facility-administered medications prior to visit.     Allergies:   Patient has no known allergies.   Social History   Socioeconomic History   Marital status: Married    Spouse name: Not on file   Number of children: Not on file   Years of education: Not on file   Highest education level: Not on file  Occupational History   Not on file  Tobacco Use   Smoking status: Every Day    Packs/day:  0.50    Years: 51.00    Total pack years: 25.50    Types: Cigarettes   Smokeless tobacco: Never   Tobacco comments:    Half of pack daily  Substance and Sexual Activity   Alcohol use: No    Comment: recovering alcoholic   Drug use: No   Sexual activity: Yes    Birth control/protection: None  Other Topics Concern   Not on file  Social History Narrative   Not on file   Social Determinants of Health   Financial Resource Strain: Not on file  Food Insecurity: Not on file  Transportation Needs: Not on file  Physical Activity: Not on file  Stress: Not on file  Social Connections: Not on file   Additional social history is notable in that he is a retired Chief Financial Officer at [employer].    Family History:  The patient's family history includes Alcohol abuse in his maternal grandfather and maternal uncle; Heart attack in his father; Heart disease in his father, mother, paternal aunt, and paternal uncle; Hyperlipidemia in his father; Hypertension in his father and mother.   ROS General: Negative; No fevers, chills, or night sweats;  HEENT: Negative; No changes in vision or hearing, sinus congestion, difficulty swallowing Pulmonary: Negative; No cough, wheezing, shortness of breath, hemoptysis Cardiovascular: Negative; No chest pain, presyncope, syncope, palpitations; history of PAF with remote severe LV dysfunction, which did improve following ablation, and bi pacing GI: Negative; No nausea, vomiting, diarrhea, or abdominal pain GU: Negative; No dysuria, hematuria, or difficulty voiding Musculoskeletal: Negative; no myalgias, joint pain, or weakness Hematologic/Oncology: Negative; no easy bruising, bleeding Endocrine: Negative; no heat/cold intolerance; no diabetes Neuro: Negative; no changes in balance, headaches Skin: Negative; No rashes or skin lesions Psychiatric: Negative; No behavioral problems,  depression Sleep: Previously on VPAP adapt servoventilation since 2007; admits to 100% compliance.   Now currently on BiPAP and he received a ResMed AirCurve 10 VAuto unit on August 22, 2017.  No residual snoring or daytime sleepiness, hypersomnolence, bruxism, restless legs, hypnogognic hallucinations, no cataplexy; Other comprehensive 14 point system review is negative.   PHYSICAL EXAM:   VS:  BP 110/64 (BP Location: Left Arm, Patient Position: Sitting, Cuff Size: Large)   Pulse 74   Ht 6' (1.829 m)   Wt (!) 340 lb 3.2 oz (154.3 kg)   SpO2 97%   BMI 46.14 kg/m      Repeat blood pressure by me was 110/64  Wt Readings from Last 3 Encounters:  11/15/22 (!) 338 lb 3.2 oz (153.4 kg)  11/08/22 (!) 340 lb 3.2 oz (154.3 kg)  09/21/22 (!) 332 lb (150.6 kg)    General: Alert, oriented, no distress.  Morbid obesity Skin: normal turgor, no rashes, warm and dry HEENT: Normocephalic, atraumatic. Pupils equal round and reactive to light; sclera anicteric; extraocular muscles intact;  Nose without nasal septal hypertrophy Mouth/Parynx benign; Mallinpatti scale 3 Neck: No JVD, no carotid bruits; normal carotid upstroke Lungs: clear to ausculatation and percussion; no wheezing or rales Chest wall: without tenderness to palpitation Heart: PMI not displaced, RRR, s1 s2 normal, 1/6 systolic murmur, no diastolic murmur, no rubs, gallops, thrills, or heaves Abdomen: soft, nontender; no hepatosplenomehaly, BS+; abdominal aorta nontender and not dilated by palpation. Back: no CVA tenderness Pulses 2+ Musculoskeletal: full range of motion, normal strength, no joint deformities Extremities: no clubbing cyanosis or edema, Homan's sign negative  Neurologic: grossly nonfocal; Cranial nerves grossly wnl Psychologic: Normal mood and affect    Studies/Labs Reviewed:    November 08, 2022 ECG (independently read by me): Ventricular paced rhythm with biventricular pacing at 75  May 2018 ECG (independently read by me): Ventricular paced rhythm with biventricular pacing.  Ventricular rate 75  bpm.  Recent Labs:    Latest Ref Rng & Units 09/20/2022   11:06 AM 08/05/2022   11:34 AM 03/15/2022    9:59 AM  BMP  Glucose 70 - 99 mg/dL 103  99  112   BUN 8 - 27 mg/dL _0 Creatinine 0.76 - 1.27 mg/dL 1.03  1.00  0.99   BUN/Creat Ratio 10 - _1 Sodium 134 - 144 mmol/L 140  142  138   Potassium 3.5 - 5.2 mmol/L 4.1  4.6  4.1   Chloride 96 - 106 mmol/L 100  103  99   CO2 20 - 29 mmol/L _2 Calcium 8.6 - 10.2 mg/dL 9.4  9.3  9.5         Latest Ref Rng & Units 09/20/2022   11:06 AM 08/05/2022   11:34 AM 03/15/2022    9:59 AM  Hepatic Function  Total Protein 6.0 - 8.5 g/dL 7.1  6.8  6.7   Albumin 3.9 - 4.9 g/dL 4.3  4.3  4.2   AST 0 - 40 IU/L _3 ALT 0 - 44 IU/L _4 Alk Phosphatase 44 - 121 IU/L 98  87  94   Total Bilirubin 0.0 - 1.2 mg/dL 0.7  0.5  0.6        Latest Ref Rng & Units 08/05/2022   11:34 AM 03/15/2022    9:59 AM 05/12/2021  8:33 AM  CBC  WBC 3.4 - 10.8 x10E3/uL 8.5  8.5  8.5   Hemoglobin 13.0 - 17.7 g/dL 16.5  15.4  15.1   Hematocrit 37.5 - 51.0 % 47.3  45.6  43.7   Platelets 150 - 450 x10E3/uL 222  230  212    Lab Results  Component Value Date   MCV 96 08/05/2022   MCV 96 03/15/2022   MCV 95 05/12/2021   Lab Results  Component Value Date   TSH 6.310 (H) 08/05/2022   Lab Results  Component Value Date   HGBA1C 5.7 (H) 09/20/2022     BNP    Component Value Date/Time   BNP 108.4 (H) 02/02/2021 1424    ProBNP No results found for: "PROBNP"   Lipid Panel     Component Value Date/Time   CHOL 99 (L) 03/15/2022 0959   TRIG 146 03/15/2022 0959   HDL 32 (L) 03/15/2022 0959   CHOLHDL 3.1 03/15/2022 0959   CHOLHDL 3.6 02/09/2017 1103   VLDL 25 02/09/2017 1103   LDLCALC 42 03/15/2022 0959     RADIOLOGY: CUP PACEART REMOTE DEVICE CHECK  Result Date: 11/10/2022 Scheduled remote reviewed. Normal device function.  Permanent AF, Pracaxa, programmed [id number]-VVIR as of 10/10.  (known therapy off) Next  remote 91 days. LA    Additional studies/ records that were reviewed today include:  I reviewed the patient's records from Dr. Sallyanne Kuster and Dr. Darrall Dears.    No download was obtained from October 24 through November 03, 2022.  ASSESSMENT:    1. OSA on BiPAP   2. CHB (complete heart block) (HCC)   3. Biventricular automatic implantable cardioverter defibrillator in situ   4. Permanent atrial fibrillation ([place])   5. Anticoagulated   6. Morbid obesity ([place])   7. History of severe cardiomyopathy; improved with normal EF.     PLAN:  Mr. Jianni Batten is a 71 year-old gentleman who has a history of severely reduced LV dysfunction with EF as low as 17% in 2007.  At that time he underwent a sleep study and was told of having very severe sleep apnea and I suspect he had complex sleep apnea syndrome with central events and even possibly Cheyne-Stokes respiration for which he was remotely treated with ASV.  He is status post AV node ablation and and is pacemaker dependent with improvement in LV function with previous CRT D.  An echo Doppler study in February 2022 showed an EF of 55 to 60% without wall motion abnormalities.  There was mild concentric LVH's.  He had moderate biatrial enlargement.  He underwent BiV ICD generator change out on August 11, 2022 by Dr. Sallyanne Kuster.  In 2018, he underwent reevaluation of sleep apnea and again was found to have severe obstructive sleep apnea with an overall AHI 57.7/h, an RDI of 71.9/h.  AHI while supine was 71.2/h.  He received a ResMed AirCurve 10 VAuto BiPAP unit and has been consistently using BiPAP with a pressure at 16/12 since his set up date on August 22, 2017.  When seen by me for his sleep evaluation in 2018 he was using a so clean cleansing device.  I discussed with him today concerns with ozone cleaning and reviewed with him the need for recall of some of the Respironics devices due to Styrofoam breakdown.  I suggest that he discontinue so clean use but  use soap and water for cleansing.  Since receiving his BiPAP machine in 2018 he has been consistent with compliance  and "never sleeps without it."  An Epworth Sleepiness Scale score was calculated in the office today and this endorsed at 3 arguing against residual daytime sleepiness.  His current BiPAP unit is set at a fixed pressure.  Since he has an auto device, I will change him to an auto mode and will keep EPAP min 12 and change in IPAP max to 20.  Pressure support is 4.  I again discussed the cardiovascular benefit with continued BiPAP use particularly with his comorbidities.  I stressed the importance of weight loss with his BMI of 46.14.  His blood pressure today is stable current therapy consisting of lisinopril 10 mg twice a day, furosemide 40 alternating with 80 mg every other day, Jardiance 10 mg, and long-acting carvedilol CR 80 mg daily.  He is on Pradaxa for anticoagulation.  He is on simvastatin 40 mg for hyperlipidemia and takes sertraline for some anxiety.  He has been using a fullface mask.  I answered all his questions.  He will return to the cardiology care of Dr. Sallyanne Kuster.  I will see him in 1 year for reevaluation or sooner as needed.   Medication Adjustments/Labs and Tests Ordered: Current medicines are reviewed at length with the patient today.  Concerns regarding medicines are outlined above.  Medication changes, Labs and Tests ordered today are listed in the Patient Instructions below. Patient Instructions  Medication Instructions:  The current medical regimen is effective;  continue present plan and medications.  *If you need a refill on your cardiac medications before your next appointment, please call your pharmacy*   Follow-Up: At [facility], you and your health needs are our priority.  As part of our continuing mission to provide you with exceptional heart care, we have created designated Provider Care Teams.  These Care Teams include your primary Cardiologist  (physician) and Advanced Practice Providers (APPs -  Physician Assistants and Nurse Practitioners) who all work together to provide you with the care you need, when you need it.  We recommend signing up for the patient portal called "MyChart".  Sign up information is provided on this After Visit Summary.  MyChart is used to connect with patients for Virtual Visits (Telemedicine).  Patients are able to view lab/test results, encounter notes, upcoming appointments, etc.  Non-urgent messages can be sent to your provider as well.   To learn more about what you can do with MyChart, go to [email or web address].    Your next appointment:   12 month(s)  The format for your next appointment:   In Person  Provider:   Shelva Majestic, MD (sleep)             Signed, Shelva Majestic, MD, [facility], [place], American Board of Sleep Medicine  11/19/2022 12:08 PM    [facility] [address] [place], [place], [place]  [address] Phone: [phone]

## 2022-11-08 NOTE — Patient Instructions (Signed)
Medication Instructions:  The current medical regimen is effective;  continue present plan and medications.  *If you need a refill on your cardiac medications before your next appointment, please call your pharmacy*   Follow-Up: At [facility], you and your health needs are our priority.  As part of our continuing mission to provide you with exceptional heart care, we have created designated Provider Care Teams.  These Care Teams include your primary Cardiologist (physician) and Advanced Practice Providers (APPs -  Physician Assistants and Nurse Practitioners) who all work together to provide you with the care you need, when you need it.  We recommend signing up for the patient portal called "MyChart".  Sign up information is provided on this After Visit Summary.  MyChart is used to connect with patients for Virtual Visits (Telemedicine).  Patients are able to view lab/test results, encounter notes, upcoming appointments, etc.  Non-urgent messages can be sent to your provider as well.   To learn more about what you can do with MyChart, go to [email or web address].    Your next appointment:   12 month(s)  The format for your next appointment:   In Person  Provider:   Shelva Majestic, MD (sleep)

## 2022-11-10 ENCOUNTER — Ambulatory Visit (INDEPENDENT_AMBULATORY_CARE_PROVIDER_SITE_OTHER): Payer: Medicare Other

## 2022-11-10 DIAGNOSIS — I442 Atrioventricular block, complete: Secondary | ICD-10-CM

## 2022-11-10 LAB — CUP PACEART REMOTE DEVICE CHECK
Battery Remaining Longevity: 83 mo
Battery Voltage: 3.04 V
Brady Statistic AP VP Percent: 0 %
Brady Statistic AP VS Percent: 0 %
Brady Statistic AS VP Percent: 0 %
Brady Statistic AS VS Percent: 0 %
Brady Statistic RA Percent Paced: 0 %
Brady Statistic RV Percent Paced: 98.18 %
Date Time Interrogation Session: 20231129044223
HighPow Impedance: 125 Ohm
HighPow Impedance: 129 Ohm
Implantable Lead Connection Status: 753985
Implantable Lead Connection Status: 753985
Implantable Lead Connection Status: 753985
Implantable Lead Implant Date: 20071217
Implantable Lead Implant Date: 20071217
Implantable Lead Implant Date: 20071217
Implantable Lead Location: 753858
Implantable Lead Location: 753859
Implantable Lead Location: 753860
Implantable Lead Model: 4194
Implantable Lead Model: 5076
Implantable Lead Model: 6947
Implantable Pulse Generator Implant Date: 20230830
Lead Channel Impedance Value: 399 Ohm
Lead Channel Impedance Value: 399 Ohm
Lead Channel Impedance Value: 418 Ohm
Lead Channel Impedance Value: 475 Ohm
Lead Channel Impedance Value: 665 Ohm
Lead Channel Impedance Value: 703 Ohm
Lead Channel Pacing Threshold Amplitude: 0.75 V
Lead Channel Pacing Threshold Amplitude: 1.125 V
Lead Channel Pacing Threshold Pulse Width: 0.4 ms
Lead Channel Pacing Threshold Pulse Width: 0.8 ms
Lead Channel Setting Pacing Amplitude: 2.5 V
Lead Channel Setting Pacing Amplitude: 2.5 V
Lead Channel Setting Pacing Pulse Width: 0.4 ms
Lead Channel Setting Pacing Pulse Width: 0.8 ms
Lead Channel Setting Sensing Sensitivity: 0.3 mV

## 2022-11-15 ENCOUNTER — Ambulatory Visit: Payer: Medicare Other | Admitting: Cardiovascular Disease

## 2022-11-15 ENCOUNTER — Encounter: Payer: Self-pay | Admitting: Cardiovascular Disease

## 2022-11-15 ENCOUNTER — Ambulatory Visit: Payer: Medicare Other | Attending: Cardiovascular Disease | Admitting: Cardiovascular Disease

## 2022-11-15 VITALS — BP 97/65 | HR 71 | Ht 72.0 in | Wt 338.2 lb

## 2022-11-15 DIAGNOSIS — I442 Atrioventricular block, complete: Secondary | ICD-10-CM | POA: Diagnosis not present

## 2022-11-15 DIAGNOSIS — I5032 Chronic diastolic (congestive) heart failure: Secondary | ICD-10-CM | POA: Diagnosis not present

## 2022-11-15 DIAGNOSIS — E890 Postprocedural hypothyroidism: Secondary | ICD-10-CM | POA: Diagnosis not present

## 2022-11-15 DIAGNOSIS — E785 Hyperlipidemia, unspecified: Secondary | ICD-10-CM | POA: Diagnosis not present

## 2022-11-15 DIAGNOSIS — Z95810 Presence of automatic (implantable) cardiac defibrillator: Secondary | ICD-10-CM | POA: Diagnosis not present

## 2022-11-15 DIAGNOSIS — I4821 Permanent atrial fibrillation: Secondary | ICD-10-CM | POA: Insufficient documentation

## 2022-11-15 DIAGNOSIS — R7303 Prediabetes: Secondary | ICD-10-CM | POA: Diagnosis not present

## 2022-11-15 DIAGNOSIS — G4733 Obstructive sleep apnea (adult) (pediatric): Secondary | ICD-10-CM | POA: Diagnosis not present

## 2022-11-15 DIAGNOSIS — D6869 Other thrombophilia: Secondary | ICD-10-CM | POA: Diagnosis not present

## 2022-11-15 NOTE — Patient Instructions (Signed)
Medication Instructions:  Continue same medications *If you need a refill on your cardiac medications before your next appointment, please call your pharmacy*   Lab Work: None ordered   Testing/Procedures: None ordered   Follow-Up: At [facility], you and your health needs are our priority.  As part of our continuing mission to provide you with exceptional heart care, we have created designated Provider Care Teams.  These Care Teams include your primary Cardiologist (physician) and Advanced Practice Providers (APPs -  Physician Assistants and Nurse Practitioners) who all work together to provide you with the care you need, when you need it.  We recommend signing up for the patient portal called "MyChart".  Sign up information is provided on this After Visit Summary.  MyChart is used to connect with patients for Virtual Visits (Telemedicine).  Patients are able to view lab/test results, encounter notes, upcoming appointments, etc.  Non-urgent messages can be sent to your provider as well.   To learn more about what you can do with MyChart, go to [email or web address].    Your next appointment: 1 year     Call in August to schedule Dec appointment      The format for your next appointment: Office   Provider:  Dr.Croitoru   Important Information About Sugar

## 2022-11-16 ENCOUNTER — Other Ambulatory Visit: Payer: Self-pay | Admitting: Cardiovascular Disease

## 2022-11-17 NOTE — Progress Notes (Signed)
Patient ID: Brian Craig, male   DOB: 1951/07/24, 71 y.o.   MRN: [id number]     Cardiology Office Note    Date:  11/17/2022   ID:  Brian Craig, DOB [birth year], MRN [id number]  PCP:  Lorrene Reid, PA-C  Cardiologist:   Sanda Klein, MD   Chief Complaint  Patient presents with   Atrial Fibrillation    History of Present Illness:  Brian Craig is a 71 y.o. male who presents for permanent atrial fibrillation, nonischemic cardiomyopathy, complete heart block status post AV node ablation, biventricular defibrillator check. He underwent a recent CRT-D generator check in August 2023, but due to high-voltage coil impedance out of range, tachy detection and therapies are off.  Generally doing well.  The device site has healed well. Continues to have issues with frequent urination that sometimes interferes with his job.  Has occasional orthostatic dizziness, mild. Reports 100% compliance with CPAP.  Still tired. TSH improving, but still slightly high at 6.310 (last checked 08/05/2022). Unchanged NYHA class 2 exertional dyspnea, currently without edema.  On Jardiance since the summer, but no real weight  loss yet. BMI 45.  He denies palpitations, dizziness, syncope, sleeping a lot events, falls or bleeding problems.  He reports compliance with Pradaxa.  Has good metabolic control: Q5Z 5.6%, LDL 42 (HDL chronically low 32). Creatinine 1.03.  Pacemaker interrogation shows permanent atrial fibrillation and complete heart block, 97.7 % effctive biventricular pacing.  There have been no episodes of VT/high ventricular rate.  His OptiVol has been in normal range. Device longevity estimated at 7 years.  In the past he had episodes of accelerated idioventricular rhythm and 2017 and 2018 but not since.    Mr. Stefanski has a history of severe dilated cardiomyopathy related to uncontrolled atrial fibrillation with rapid ventricular response that started in the setting of thyrotoxicosis. His left  ventricular ejection fraction was as low as 17%. Cardioversion failed. Eventually underwent AV node ablation and received a biventricular pacemaker-defibrillator in 2007. He underwent a generator change out in 2012 (atrial lead Medtronic 5076, ICD lead Medtronic [phone] Sprint Quattro secure, LV lead Medtronic 4194 Attain bipolar, Medtronic Viva XT gen change May 2016). His left ventricular ejection fraction has improved substantially since AV node ablation. His most recent echocardiogram performed in August 2015 shows normal left ventricular systolic function. He remains in permanent atrial fibrillation.     Past Medical History:  Diagnosis Date   Atrial fibrillation ([place])    CHF (congestive heart failure) ([place])    Graves disease    Graves   Hyperlipidemia    Hypertension    OSA treated with BiPAP    Substance abuse ([place])    alcohol    Past Surgical History:  Procedure Laterality Date   APPENDECTOMY     BIV ICD GENERATOR CHANGEOUT N/A 08/11/2022   Procedure: BIV ICD GENERATOR CHANGEOUT;  Surgeon: Deboraha Sprang, MD;  Location: [facility];  Service: Cardiovascular;  Laterality: N/A;   BIV ICD GENERTAOR CHANGE OUT Left 2012   Medtronic Protecta   CARDIAC ELECTROPHYSIOLOGY STUDY AND ABLATION     EP IMPLANTABLE DEVICE N/A 05/06/2015   Procedure: ICD Generator Changeout;  Surgeon: Sanda Klein, MD;  Location: [facility];  Service: Cardiovascular;  Laterality: N/A;   HERNIA REPAIR     MENISCUS REPAIR Right    VASECTOMY     unilateral due to injury    Outpatient Medications Prior to Visit  Medication Sig Dispense Refill   acetaminophen (  TYLENOL) 500 MG tablet Take 1,000 mg by mouth every 6 (six) hours as needed for moderate pain.     ALPRAZolam (XANAX) 0.5 MG tablet Take 1 tablet (0.5 mg total) by mouth 3 (three) times daily as needed for anxiety. 30 tablet 0   Carboxymethylcellulose Sodium (LUBRICANT EYE DROPS OP) Place 1 drop into both eyes at bedtime.     carvedilol  (COREG CR) 80 MG 24 hr capsule TAKE 1 CAPSULE DAILY 90 capsule 3   cholecalciferol (VITAMIN D3) 25 MCG (1000 UNIT) tablet Take 1,000 Units by mouth daily.     furosemide (LASIX) 40 MG tablet Take lasix 40 mg (1 tablet) every other day alternating with 80 mg ( 2 tablets) every other day (Patient taking differently: Take 40 mg by mouth daily.) 180 tablet 3   levothyroxine (SYNTHROID, LEVOTHROID) 200 MCG tablet Take 200 mcg by mouth daily before breakfast.     lisinopril (ZESTRIL) 10 MG tablet TAKE 1 TABLET TWICE A DAY 180 tablet 3   Melatonin 5 MG CAPS Take 5 mg by mouth at bedtime.     Multiple Vitamin (MULTIVITAMIN) tablet Take 1 tablet by mouth daily.     potassium chloride (KLOR-CON) 10 MEQ tablet TAKE 1 TABLET DAILY 90 tablet 3   PRADAXA 150 MG CAPS capsule TAKE 1 CAPSULE TWICE DAILY 180 capsule 3   sertraline (ZOLOFT) 50 MG tablet Take 1 tablet (50 mg total) by mouth daily. 90 tablet 1   simvastatin (ZOCOR) 40 MG tablet TAKE 1 TABLET DAILY AT 6 P.M. 90 tablet 3   empagliflozin (JARDIANCE) 10 MG TABS tablet Take 1 tablet (10 mg total) by mouth daily before breakfast. 90 tablet 1   No facility-administered medications prior to visit.     Allergies:   Patient has no known allergies.   Social History   Socioeconomic History   Marital status: Married    Spouse name: Not on file   Number of children: Not on file   Years of education: Not on file   Highest education level: Not on file  Occupational History   Not on file  Tobacco Use   Smoking status: Every Day    Packs/day: 0.50    Years: 51.00    Total pack years: 25.50    Types: Cigarettes   Smokeless tobacco: Never   Tobacco comments:    Half of pack daily  Substance and Sexual Activity   Alcohol use: No    Comment: recovering alcoholic   Drug use: No   Sexual activity: Yes    Birth control/protection: None  Other Topics Concern   Not on file  Social History Narrative   Not on file   Social Determinants of Health    Financial Resource Strain: Not on file  Food Insecurity: Not on file  Transportation Needs: Not on file  Physical Activity: Not on file  Stress: Not on file  Social Connections: Not on file     Family History:  The patient's family history includes Alcohol abuse in his maternal grandfather and maternal uncle; Heart attack in his father; Heart disease in his father, mother, paternal aunt, and paternal uncle; Hyperlipidemia in his father; Hypertension in his father and mother.   ROS:   Please see the history of present illness.    ROS All other systems reviewed and are negative.   PHYSICAL EXAM:   VS:  BP 97/65 (BP Location: Left Arm, Patient Position: Sitting, Cuff Size: Large)   Pulse 71   Ht  6' (1.829 m)   Wt (!) 153.4 kg   SpO2 94%   BMI 45.87 kg/m       General: Alert, oriented x3, no distress, morbidly obese. Healed device site Head: no evidence of trauma, PERRL, EOMI, no exophtalmos or lid lag, no myxedema, no xanthelasma; normal ears, nose and oropharynx Neck: normal jugular venous pulsations and no hepatojugular reflux; brisk carotid pulses without delay and no carotid bruits Chest: clear to auscultation, no signs of consolidation by percussion or palpation, normal fremitus, symmetrical and full respiratory excursions Cardiovascular: normal position and quality of the apical impulse, regular rhythm, normal first and second heart sounds, no murmurs, rubs or gallops Abdomen: no tenderness or distention, no masses by palpation, no abnormal pulsatility or arterial bruits, normal bowel sounds, no hepatosplenomegaly Extremities: no clubbing, cyanosis or edema; 2+ radial, ulnar and brachial pulses bilaterally; 2+ right femoral, posterior tibial and dorsalis pedis pulses; 2+ left femoral, posterior tibial and dorsalis pedis pulses; no subclavian or femoral bruits Neurological: grossly nonfocal Psych: Normal mood and affect   Wt Readings from Last 3 Encounters:  11/15/22 (!)  153.4 kg  11/08/22 (!) 154.3 kg  09/21/22 (!) 150.6 kg      Studies/Labs Reviewed:   EKG:  EKG is not ordered today.  Intracardia celectrogram shows BiV paced rhythm  Recent Labs: 08/05/2022: Hemoglobin 16.5; Platelets 222; TSH 6.310 09/20/2022: ALT 15; BUN 13; Creatinine, Ser 1.03; Potassium 4.1; Sodium 140   Lipid Panel    Component Value Date/Time   CHOL 99 (L) 03/15/2022 0959   TRIG 146 03/15/2022 0959   HDL 32 (L) 03/15/2022 0959   CHOLHDL 3.1 03/15/2022 0959   CHOLHDL 3.6 02/09/2017 1103   VLDL 25 02/09/2017 1103   LDLCALC 42 03/15/2022 0959      ASSESSMENT:    1. CHB (complete heart block) ([place])   2. Permanent atrial fibrillation ([place])   3. Acquired thrombophilia ([place])   4. Biventricular automatic implantable cardioverter defibrillator in situ   5. Chronic diastolic heart failure ([place])   6. OSA on CPAP   7. Morbid obesity ([place])   8. Prediabetes   9. Dyslipidemia (high LDL; low HDL)   10. Postablative hypothyroidism       PLAN:  In order of problems listed above:  1. CHB: s/p AV node ablation.  Pacemaker dependent 2. AFib: Permanent arrhythmia.  No history of previous stroke or other embolic events. CHADSvasc 2 (age, CHF).   3. Anticoagulation: Denies falls or serious bleeding problems.  Most recent hemoglobin 16.5. 4. CRT-D: recent generator change well seated. The tachyarrhythmia therapies are turned off due to problems with the high voltage impedance. BiV pacing is working well. He has recovered LVEF so tachy intervention is less likely to be necessary. 5. CHF: Fully recovered LVEF.  Sedentary but appears to have NYHA functional class I.  On ACE inhibitor and carvedilol.  Unlikely to tolerate higher doses since his blood pressure is low. On SGLT2 inhibitor. Appears euvolemic clinically and by optivol. 6. OSA on CPAP: Worsening recent fatigue and hypersomnolence.  Will refer to sleep clinic.  Continue 100% CPAP compliance.  Alternative causes could be  insufficiently compensated hypothyroidism Improving). 7. Obesity/prediabetes: Started Jardiance primarily for heart failure, although this will also help with glycemic control and hopefully will contribute to weight loss. 8.  Dyslipidemia: Typical pattern for insulin resistance.  HDL will not improve without substantial weight loss. 9. Hypothyroidism (post ablation): TSH improving, still borderline high. 10. Alcoholism in recovery: Remains abstinent  Medication Adjustments/Labs and Tests Ordered: Current medicines are reviewed at length with the patient today.  Concerns regarding medicines are outlined above.  Medication changes, Labs and Tests ordered today are listed in the Patient Instructions below. Patient Instructions  Medication Instructions:  Continue same medications *If you need a refill on your cardiac medications before your next appointment, please call your pharmacy*   Lab Work: None ordered   Testing/Procedures: None ordered   Follow-Up: At [facility], you and your health needs are our priority.  As part of our continuing mission to provide you with exceptional heart care, we have created designated Provider Care Teams.  These Care Teams include your primary Cardiologist (physician) and Advanced Practice Providers (APPs -  Physician Assistants and Nurse Practitioners) who all work together to provide you with the care you need, when you need it.  We recommend signing up for the patient portal called "MyChart".  Sign up information is provided on this After Visit Summary.  MyChart is used to connect with patients for Virtual Visits (Telemedicine).  Patients are able to view lab/test results, encounter notes, upcoming appointments, etc.  Non-urgent messages can be sent to your provider as well.   To learn more about what you can do with MyChart, go to [email or web address].    Your next appointment: 1 year     Call in August to schedule Dec appointment       The format for your next appointment: Office   Provider:  Dr.Atticus Wedin   Important Information About Sugar           Signed, Sanda Klein, MD  11/17/2022 7:13 PM    [facility] [address], [place], [place]  [address] Phone: [phone]; Fax: [phone]

## 2022-11-19 ENCOUNTER — Encounter: Payer: Self-pay | Admitting: Cardiovascular Disease

## 2022-11-22 ENCOUNTER — Ambulatory Visit (INDEPENDENT_AMBULATORY_CARE_PROVIDER_SITE_OTHER): Payer: Medicare Other | Admitting: Nurse Practitioner

## 2022-11-22 ENCOUNTER — Encounter: Payer: Self-pay | Admitting: Nurse Practitioner

## 2022-11-22 VITALS — Wt 328.0 lb

## 2022-11-22 DIAGNOSIS — Z87891 Personal history of nicotine dependence: Secondary | ICD-10-CM

## 2022-11-22 DIAGNOSIS — Z0000 Encounter for general adult medical examination without abnormal findings: Secondary | ICD-10-CM | POA: Diagnosis not present

## 2022-11-22 DIAGNOSIS — F17210 Nicotine dependence, cigarettes, uncomplicated: Secondary | ICD-10-CM | POA: Diagnosis not present

## 2022-11-22 DIAGNOSIS — R7303 Prediabetes: Secondary | ICD-10-CM | POA: Diagnosis not present

## 2022-11-22 DIAGNOSIS — I2510 Atherosclerotic heart disease of native coronary artery without angina pectoris: Secondary | ICD-10-CM | POA: Diagnosis not present

## 2022-11-22 DIAGNOSIS — E785 Hyperlipidemia, unspecified: Secondary | ICD-10-CM | POA: Diagnosis not present

## 2022-11-22 DIAGNOSIS — I10 Essential (primary) hypertension: Secondary | ICD-10-CM

## 2022-11-22 DIAGNOSIS — Z122 Encounter for screening for malignant neoplasm of respiratory organs: Secondary | ICD-10-CM

## 2022-11-22 NOTE — Progress Notes (Signed)
.Virtual Visit via Telephone Note  I connected with Brian Craig on 11/22/22 at 10:30 AM EST by telephone and verified that I am speaking with the correct person using two identifiers.  Location: Patient: home Provider: [place] [facility]     I discussed the limitations, risks, security and privacy concerns of performing an evaluation and management service by telephone and the availability of in person appointments. I also discussed with the patient that there may be a patient responsible charge related to this service. The patient expressed understanding and agreed to proceed.   I discussed the assessment and treatment plan with the patient. The patient was provided an opportunity to ask questions and all were answered. The patient agreed with the plan and demonstrated an understanding of the instructions.   The patient was advised to call back or seek an in-person evaluation if the symptoms worsen or if the condition fails to improve as anticipated.  I provided 25 minutes of non-face-to-face time during this encounter.   Ronnell Freshwater, NP  Subjective:   Brian Craig is a 71 y.o. male who presents for Medicare Annual/Subsequent preventive examination. -prediabetes. Most recent HgbA1c 09/20/2022 - 5.7  --diet controlled.  -htn Hyperlipidemia  --lipids very well managed.  -long term smoker --due for lung cancer screening.  --he is a current, everyday smoker. Does not wish to quit smoking at this time.  -declines vaccine for flu and shingles.  -denies chest pain, chest pressure, or shortness of breath. He denies headaches or visual disturbances. He denies abdominal pain, nausea, vomiting, or changes in bowel or bladder habits.      Review of Systems    Refer to HPI       Objective:    Today's Vitals   11/22/22 1021  Weight: (Abnormal) 328 lb (148.8 kg)   Body mass index is 44.48 kg/m.    Row Labels 08/11/2022    8:25 AM 07/19/2017    9:45 PM  05/12/2017    8:21 PM 02/21/2017   12:08 PM 05/06/2015   12:33 PM  Advanced Directives   Section Header. No data exists in this row.       Does Patient Have a Medical Advance Directive?   Yes No No No No  Type of Advance Directive   [place]      Does patient want to make changes to medical advance directive?   No - Patient declined      Would patient like information on creating a medical advance directive?    Yes (Inpatient - patient requests chaplain consult to create a medical advance directive);No - Patient declined Yes (MAU/Ambulatory/Procedural Areas - Information given) Yes (MAU/Ambulatory/Procedural Areas - Information given) No - patient declined information    Current Medications (verified) Outpatient Encounter Medications as of 11/22/2022  Medication Sig   acetaminophen (TYLENOL) 500 MG tablet Take 1,000 mg by mouth every 6 (six) hours as needed for moderate pain.   ALPRAZolam (XANAX) 0.5 MG tablet Take 1 tablet (0.5 mg total) by mouth 3 (three) times daily as needed for anxiety.   Carboxymethylcellulose Sodium (LUBRICANT EYE DROPS OP) Place 1 drop into both eyes at bedtime.   carvedilol (COREG CR) 80 MG 24 hr capsule TAKE 1 CAPSULE DAILY   cholecalciferol (VITAMIN D3) 25 MCG (1000 UNIT) tablet Take 1,000 Units by mouth daily.   furosemide (LASIX) 40 MG tablet Take lasix 40 mg (1 tablet) every other day alternating with 80 mg ( 2  tablets) every other day (Patient taking differently: Take 40 mg by mouth daily.)   JARDIANCE 10 MG TABS tablet TAKE 1 TABLET DAILY BEFORE BREAKFAST   levothyroxine (SYNTHROID, LEVOTHROID) 200 MCG tablet Take 200 mcg by mouth daily before breakfast.   lisinopril (ZESTRIL) 10 MG tablet TAKE 1 TABLET TWICE A DAY   Melatonin 5 MG CAPS Take 5 mg by mouth at bedtime.   Multiple Vitamin (MULTIVITAMIN) tablet Take 1 tablet by mouth daily.   potassium chloride (KLOR-CON) 10 MEQ tablet TAKE 1 TABLET DAILY   PRADAXA 150 MG CAPS capsule TAKE 1  CAPSULE TWICE DAILY   sertraline (ZOLOFT) 50 MG tablet Take 1 tablet (50 mg total) by mouth daily.   simvastatin (ZOCOR) 40 MG tablet TAKE 1 TABLET DAILY AT 6 P.M.   No facility-administered encounter medications on file as of 11/22/2022.    Allergies (verified) Patient has no known allergies.   History: Past Medical History:  Diagnosis Date   Atrial fibrillation ([place])    CHF (congestive heart failure) ([place])    Graves disease    Graves   Hyperlipidemia    Hypertension    OSA treated with BiPAP    Substance abuse ([place])    alcohol   Past Surgical History:  Procedure Laterality Date   APPENDECTOMY     BIV ICD GENERATOR CHANGEOUT N/A 08/11/2022   Procedure: BIV ICD GENERATOR CHANGEOUT;  Surgeon: Deboraha Sprang, MD;  Location: [facility];  Service: Cardiovascular;  Laterality: N/A;   BIV ICD GENERTAOR CHANGE OUT Left 2012   Medtronic Protecta   CARDIAC ELECTROPHYSIOLOGY STUDY AND ABLATION     EP IMPLANTABLE DEVICE N/A 05/06/2015   Procedure: ICD Generator Changeout;  Surgeon: Sanda Klein, MD;  Location: [facility];  Service: Cardiovascular;  Laterality: N/A;   HERNIA REPAIR     MENISCUS REPAIR Right    VASECTOMY     unilateral due to injury   Family History  Problem Relation Age of Onset   Heart disease Mother    Hypertension Mother    Heart disease Father    Hyperlipidemia Father    Hypertension Father    Heart attack Father    Heart disease Paternal Aunt    Heart disease Paternal Uncle    Alcohol abuse Maternal Uncle    Alcohol abuse Maternal Grandfather    Social History   Socioeconomic History   Marital status: Married    Spouse name: Not on file   Number of children: Not on file   Years of education: Not on file   Highest education level: Not on file  Occupational History   Not on file  Tobacco Use   Smoking status: Every Day    Packs/day: 0.50    Years: 51.00    Total pack years: 25.50    Types: Cigarettes   Smokeless tobacco:  Never   Tobacco comments:    Half of pack daily  Substance and Sexual Activity   Alcohol use: No    Comment: recovering alcoholic   Drug use: No   Sexual activity: Yes    Birth control/protection: None  Other Topics Concern   Not on file  Social History Narrative   Not on file   Social Determinants of Health   Financial Resource Strain: Low Risk  (11/22/2022)   Overall Financial Resource Strain (CARDIA)    Difficulty of Paying Living Expenses: Not hard at all  Food Insecurity: No Food Insecurity (11/22/2022)   Hunger Vital  Sign    Worried About Charity fundraiser in the Last Year: Never true    [place] in the Last Year: Never true  Transportation Needs: No Transportation Needs (11/22/2022)   PRAPARE - Hydrologist (Medical): No    Lack of Transportation (Non-Medical): No  Physical Activity: Inactive (11/22/2022)   Exercise Vital Sign    Days of Exercise per Week: 0 days    Minutes of Exercise per Session: 0 min  Stress: No Stress Concern Present (11/22/2022)   [place]    Feeling of Stress : Not at all  Social Connections: Moderately Isolated (11/22/2022)   Social Connection and Isolation Panel [NHANES]    Frequency of Communication with Friends and Family: More than three times a week    Frequency of Social Gatherings with Friends and Family: Once a week    Attends Religious Services: Never    Marine scientist or Organizations: No    Attends Archivist Meetings: Never    Marital Status: Married   The patient is alert and oriented. He is pleasant and answering all questions appropriately. Breathing is non-labored. He is in no acute distress.    Tobacco Counseling Ready to quit: Not Answered Counseling given: Not Answered Tobacco comments: Half of pack daily   Clinical Intake:  Pre-visit preparation completed: Yes  Pain : No/denies pain      BMI - recorded: 44.48 Nutritional Status: BMI > 30  Obese Diabetes: No (history of prediabetes)  How often do you need to have someone help you when you read instructions, pamphlets, or other written materials from your doctor or pharmacy?: 1 - Never  Diabetic?No  Interpreter Needed?: No      Activities of Daily Living   Row Labels 11/22/2022   10:28 AM 09/20/2022   10:36 AM  In your present state of health, do you have any difficulty performing the following activities:   Section Header. No data exists in this row.    Hearing?   0 0  Vision?   0 0  Difficulty concentrating or making decisions?   0 0  Walking or climbing stairs?   1 0  Dressing or bathing?   0 0  Doing errands, shopping?   0 0    Patient Care Team: Lorrene Reid, PA-C as PCP - General (Physician Assistant) Croitoru, Dani Gobble, MD as PCP - Cardiology (Cardiology) Brendolyn Patty, MD (Dermatology) Warden Fillers, MD as Consulting Physician (Ophthalmology) Amalia Greenhouse, MD as Referring Physician (Endocrinology) Croitoru, Dani Gobble, MD as Consulting Physician (Cardiology)  Indicate any recent Medical Services you may have received from other than [facility] providers in the past year (date may be approximate).     Assessment:   1. Encounter for Medicare annual wellness exam Annual medicare wellness visit today   2. Personal history of nicotine dependence Long term smoker, not ready to quit. Lung cancer screening ordered during today's visit  - CT CHEST LUNG CANCER SCREENING LOW DOSE WO CONTRAST; Future  3. Encounter for screening for malignant neoplasm of lung in current smoker with 30 pack year history or greater Long term smoker, not ready to quit. Lung cancer screening ordered during today's visit  - CT CHEST LUNG CANCER SCREENING LOW DOSE WO CONTRAST; Future  4. Essential hypertension Stable. Continue medication as prescribed   5. Hyperlipidemia, unspecified hyperlipidemia type Lipid panel stable  10//2023. Continue medication as prescribed  6. Prediabetes Check HgbA1c at next in-office visit in two months   7. Coronary artery disease involving native coronary artery of native heart without angina pectoris Continue regular visits with cardiology as scheduled.    Hearing/Vision screen No results found.  Depression Screen   Row Labels 11/22/2022   10:25 AM 09/20/2022   10:34 AM 03/15/2022    9:41 AM 11/11/2021    9:01 AM 05/18/2021    1:40 PM 01/12/2021    2:33 PM 01/12/2021    2:29 PM  PHQ 2/9 Scores   Section Header. No data exists in this row.         PHQ - 2 Score   0 0 0 0 0 0 0  PHQ- 9 Score    '2 1 1 '$ 0 0 0    Fall Risk   Row Labels 09/20/2022   10:33 AM 03/15/2022    9:41 AM 11/11/2021    9:01 AM 05/18/2021   10:24 AM 01/12/2021    2:29 PM  Fall Risk    Section Header. No data exists in this row.       Falls in the past year?   0 0 0 0 0  Number falls in past yr:   0 0 0 0   Injury with Fall?   0 0 0 0   Risk for fall due to :   No Fall Risks No Fall Risks  No Fall Risks   Follow up   Falls evaluation completed Falls evaluation completed Falls evaluation completed Falls evaluation completed Falls evaluation completed    [place]:  Any stairs in or around the home? Yes  If so, are there any without handrails? No  Home free of loose throw rugs in walkways, pet beds, electrical cords, etc? No  Adequate lighting in your home to reduce risk of falls? Yes   ASSISTIVE DEVICES UTILIZED TO PREVENT FALLS:  Life alert? No  Use of a cane, walker or w/c? No  Grab bars in the bathroom? Yes  Shower chair or bench in shower? No  Elevated toilet seat or a handicapped toilet? Yes   TIMED UP AND GO:  Was the test performed? unable to perform this test at it was telehealth visit.     Cognitive Function:       Row Labels 11/22/2022   10:26 AM 05/18/2021   10:03 AM 04/14/2020    8:22 AM 04/09/2019    1:12 PM  6CIT Screen   Section Header.  No data exists in this row.      What Year?   0 points 0 points 0 points 0 points  What month?   0 points 0 points 0 points 0 points  What time?   0 points 0 points 0 points 0 points  Count back from 20   0 points 0 points 0 points 0 points  Months in reverse   0 points 2 points 0 points 0 points  Repeat phrase   0 points 0 points 2 points 2 points  Total Score   0 points 2 points 2 points 2 points    Immunizations Immunization History  Administered Date(s) Administered   PFIZER(Purple Top)SARS-COV-2 Vaccination 01/27/2020, 02/19/2020   Pneumococcal Conjugate-13 02/22/2018   Pneumococcal Polysaccharide-23 04/24/2019    TDAP status: Due, Education has been provided regarding the importance of this vaccine. Advised may receive this vaccine at local pharmacy or Health Dept. Aware to provide a copy of the  vaccination record if obtained from local pharmacy or Health Dept. Verbalized acceptance and understanding.  Flu Vaccine status: Declined, Education has been provided regarding the importance of this vaccine but patient still declined. Advised may receive this vaccine at local pharmacy or Health Dept. Aware to provide a copy of the vaccination record if obtained from local pharmacy or Health Dept. Verbalized acceptance and understanding.  Pneumococcal vaccine status: Up to date  Covid-19 vaccine status: Information provided on how to obtain vaccines.   Qualifies for Shingles Vaccine? Yes   Zostavax completed No   Shingrix Completed?: No.    Education has been provided regarding the importance of this vaccine. Patient has been advised to call insurance company to determine out of pocket expense if they have not yet received this vaccine. Advised may also receive vaccine at local pharmacy or Health Dept. Verbalized acceptance and understanding.  Screening Tests Health Maintenance  Topic Date Due   DTaP/Tdap/Td (1 - Tdap) Never done   COLONOSCOPY (Pts 45-3yr Insurance coverage will need  to be confirmed)  Never done   COVID-19 Vaccine (3 - Pfizer risk series) 03/18/2020   Lung Cancer Screening  07/13/2022   Zoster Vaccines- Shingrix (1 of 2) 02/21/2023 (Originally 10/22/1970)   INFLUENZA VACCINE  03/13/2023 (Originally 07/13/2022)   Medicare Annual Wellness (AWV)  11/23/2023   Pneumonia Vaccine 71 Years old  Completed   Hepatitis C Screening  Completed   HPV VACCINES  Aged Out    Health Maintenance  Health Maintenance Due  Topic Date Due   DTaP/Tdap/Td (1 - Tdap) Never done   COLONOSCOPY (Pts 45-421yrInsurance coverage will need to be confirmed)  Never done   COVID-19 Vaccine (3 - Pfizer risk series) 03/18/2020   Lung Cancer Screening  07/13/2022    Colorectal cancer screening: No longer required.   Lung Cancer Screening: (Low Dose CT Chest recommended if Age 71-80ears, 30 pack-year currently smoking OR have quit w/in 15years.) does qualify.   Lung Cancer Screening Referral: Referral placed  Additional Screening:  Hepatitis C Screening: does qualify; Completed No  Vision Screening: Recommended annual ophthalmology exams for early detection of glaucoma and other disorders of the eye. Is the patient up to date with their annual eye exam?   Patient is scheduling If pt is not established with a provider, would they like to be referred to a provider to establish care? No .   Dental Screening: Recommended annual dental exams for proper oral hygiene  Community Resource Referral / Chronic Care Management: CRR required this visit?  No   CCM required this visit?  No      Plan:     I have personally reviewed and noted the following in the patient's chart:   Medical and social history Use of alcohol, tobacco or illicit drugs  Current medications and supplements including opioid prescriptions. Patient is not currently taking opioid prescriptions. Functional ability and status Nutritional status Physical activity Advanced directives List of other  physicians Hospitalizations, surgeries, and ER visits in previous 12 months Vitals Screenings to include cognitive, depression, and falls Referrals and appointments  In addition, I have reviewed and discussed with patient certain preventive protocols, quality metrics, and best practice recommendations. A written personalized care plan for preventive services as well as general preventive health recommendations were provided to patient.     HeRonnell FreshwaterNP   11/22/2022   Nurse Notes: Wellness done through Telehealth

## 2022-12-07 NOTE — Progress Notes (Signed)
Remote ICD transmission.   

## 2022-12-27 ENCOUNTER — Other Ambulatory Visit: Payer: Self-pay | Admitting: Cardiovascular Disease

## 2023-01-03 ENCOUNTER — Ambulatory Visit
Admission: RE | Admit: 2023-01-03 | Discharge: 2023-01-03 | Disposition: A | Discharge status: Home / Self Care | Payer: Medicare Other | Source: Ambulatory Visit | Attending: Nurse Practitioner | Admitting: Nurse Practitioner

## 2023-01-03 ENCOUNTER — Encounter: Payer: Self-pay | Admitting: Nurse Practitioner

## 2023-01-03 DIAGNOSIS — F17200 Nicotine dependence, unspecified, uncomplicated: Secondary | ICD-10-CM

## 2023-01-03 DIAGNOSIS — Z87891 Personal history of nicotine dependence: Secondary | ICD-10-CM

## 2023-01-03 DIAGNOSIS — F17210 Nicotine dependence, cigarettes, uncomplicated: Secondary | ICD-10-CM | POA: Diagnosis not present

## 2023-01-10 DIAGNOSIS — E039 Hypothyroidism, unspecified: Secondary | ICD-10-CM | POA: Diagnosis not present

## 2023-01-10 DIAGNOSIS — Z79890 Hormone replacement therapy (postmenopausal): Secondary | ICD-10-CM | POA: Diagnosis not present

## 2023-02-09 ENCOUNTER — Ambulatory Visit: Payer: Medicare Other

## 2023-02-09 DIAGNOSIS — I442 Atrioventricular block, complete: Secondary | ICD-10-CM | POA: Diagnosis not present

## 2023-02-09 LAB — CUP PACEART REMOTE DEVICE CHECK
Battery Remaining Longevity: 80 mo
Battery Voltage: 3.01 V
Brady Statistic AP VP Percent: 0 %
Brady Statistic AP VS Percent: 0 %
Brady Statistic AS VP Percent: 0 %
Brady Statistic AS VS Percent: 0 %
Brady Statistic RA Percent Paced: 0 %
Brady Statistic RV Percent Paced: 98.5 %
Date Time Interrogation Session: 20240228031602
HighPow Impedance: 120 Ohm
HighPow Impedance: 124 Ohm
Implantable Lead Connection Status: 753985
Implantable Lead Connection Status: 753985
Implantable Lead Connection Status: 753985
Implantable Lead Implant Date: 20071217
Implantable Lead Implant Date: 20071217
Implantable Lead Implant Date: 20071217
Implantable Lead Location: 753858
Implantable Lead Location: 753859
Implantable Lead Location: 753860
Implantable Lead Model: 4194
Implantable Lead Model: 5076
Implantable Lead Model: 6947
Implantable Pulse Generator Implant Date: 20230830
Lead Channel Impedance Value: 342 Ohm
Lead Channel Impedance Value: 361 Ohm
Lead Channel Impedance Value: 399 Ohm
Lead Channel Impedance Value: 646 Ohm
Lead Channel Impedance Value: 722 Ohm
Lead Channel Impedance Value: 950 Ohm
Lead Channel Pacing Threshold Amplitude: 0.625 V
Lead Channel Pacing Threshold Amplitude: 1.25 V
Lead Channel Pacing Threshold Pulse Width: 0.4 ms
Lead Channel Pacing Threshold Pulse Width: 0.8 ms
Lead Channel Setting Pacing Amplitude: 2.5 V
Lead Channel Setting Pacing Amplitude: 2.5 V
Lead Channel Setting Pacing Pulse Width: 0.4 ms
Lead Channel Setting Pacing Pulse Width: 0.8 ms
Lead Channel Setting Sensing Sensitivity: 0.3 mV

## 2023-02-16 ENCOUNTER — Other Ambulatory Visit: Payer: Self-pay | Admitting: Cardiovascular Disease

## 2023-03-15 NOTE — Progress Notes (Signed)
Remote ICD transmission.   

## 2023-03-18 ENCOUNTER — Telehealth: Payer: Self-pay | Admitting: *Deleted

## 2023-03-18 DIAGNOSIS — F419 Anxiety disorder, unspecified: Secondary | ICD-10-CM

## 2023-03-18 MED ORDER — ALPRAZOLAM 0.5 MG PO TABS: 0.5000 mg | ORAL_TABLET | Freq: Three times a day (TID) | ORAL | 0 refills | Status: DC | PRN

## 2023-03-18 NOTE — Telephone Encounter (Signed)
Pt LVM requesting refill on below.     ALPRAZolam Prudy Feeler) 0.5 MG tablet   [address] - [place], [place] - [address]    LOV 11/22/22

## 2023-03-18 NOTE — Telephone Encounter (Signed)
Xanax 0.5 mg tablets have been sent into [place] [facility].  He does need to get in for an office visit for follow-up of chronic conditions sometime in the next 2 months.

## 2023-03-22 NOTE — Telephone Encounter (Signed)
Pt informed and appointment scheduled.

## 2023-03-29 DIAGNOSIS — H02831 Dermatochalasis of right upper eyelid: Secondary | ICD-10-CM | POA: Diagnosis not present

## 2023-03-29 DIAGNOSIS — H02834 Dermatochalasis of left upper eyelid: Secondary | ICD-10-CM | POA: Diagnosis not present

## 2023-03-29 DIAGNOSIS — H0520 Unspecified exophthalmos: Secondary | ICD-10-CM | POA: Diagnosis not present

## 2023-03-29 DIAGNOSIS — H2513 Age-related nuclear cataract, bilateral: Secondary | ICD-10-CM | POA: Diagnosis not present

## 2023-03-29 DIAGNOSIS — H16213 Exposure keratoconjunctivitis, bilateral: Secondary | ICD-10-CM | POA: Diagnosis not present

## 2023-03-29 DIAGNOSIS — H04123 Dry eye syndrome of bilateral lacrimal glands: Secondary | ICD-10-CM | POA: Diagnosis not present

## 2023-03-29 DIAGNOSIS — E0500 Thyrotoxicosis with diffuse goiter without thyrotoxic crisis or storm: Secondary | ICD-10-CM | POA: Diagnosis not present

## 2023-03-29 DIAGNOSIS — G4730 Sleep apnea, unspecified: Secondary | ICD-10-CM | POA: Diagnosis not present

## 2023-03-31 ENCOUNTER — Telehealth: Payer: Self-pay

## 2023-03-31 DIAGNOSIS — F419 Anxiety disorder, unspecified: Secondary | ICD-10-CM

## 2023-03-31 NOTE — Telephone Encounter (Signed)
Olegario Messier from [facility] called office for refill on patients sertraline, [facility] number is [phone], and Ref # is [id number]

## 2023-04-01 MED ORDER — SERTRALINE HCL 50 MG PO TABS: 50.0000 mg | ORAL_TABLET | Freq: Every day | ORAL | 0 refills | Status: DC

## 2023-04-01 NOTE — Telephone Encounter (Signed)
Refill sent to [facility].  

## 2023-04-05 ENCOUNTER — Ambulatory Visit (INDEPENDENT_AMBULATORY_CARE_PROVIDER_SITE_OTHER): Payer: Medicare Other | Admitting: Family Medicine

## 2023-04-05 ENCOUNTER — Encounter: Payer: Self-pay | Admitting: Family Medicine

## 2023-04-05 VITALS — BP 120/82 | HR 86 | Resp 18 | Ht 72.0 in | Wt 342.0 lb

## 2023-04-05 DIAGNOSIS — I10 Essential (primary) hypertension: Secondary | ICD-10-CM | POA: Diagnosis not present

## 2023-04-05 DIAGNOSIS — Z23 Encounter for immunization: Secondary | ICD-10-CM

## 2023-04-05 DIAGNOSIS — E890 Postprocedural hypothyroidism: Secondary | ICD-10-CM

## 2023-04-05 DIAGNOSIS — Z1212 Encounter for screening for malignant neoplasm of rectum: Secondary | ICD-10-CM

## 2023-04-05 DIAGNOSIS — F419 Anxiety disorder, unspecified: Secondary | ICD-10-CM | POA: Diagnosis not present

## 2023-04-05 DIAGNOSIS — R7303 Prediabetes: Secondary | ICD-10-CM | POA: Diagnosis not present

## 2023-04-05 DIAGNOSIS — E785 Hyperlipidemia, unspecified: Secondary | ICD-10-CM

## 2023-04-05 MED ORDER — SHINGRIX 50 MCG/0.5ML IM SUSR
0.5000 mL | Freq: Once | INTRAMUSCULAR | 0 refills | Status: AC
Start: 2023-04-05 — End: 2023-04-05

## 2023-04-05 MED ORDER — ALPRAZOLAM 0.5 MG PO TABS
0.5000 mg | ORAL_TABLET | Freq: Three times a day (TID) | ORAL | 0 refills | Status: DC | PRN
Start: 2023-04-15 — End: 2023-08-05

## 2023-04-05 MED ORDER — TETANUS-DIPHTH-ACELL PERTUSSIS 5-2.5-18.5 LF-MCG/0.5 IM SUSY
0.5000 mL | PREFILLED_SYRINGE | Freq: Once | INTRAMUSCULAR | 0 refills | Status: AC
Start: 2023-04-05 — End: 2023-04-05

## 2023-04-05 NOTE — Assessment & Plan Note (Signed)
Followed by endocrinology.  He is requesting that thyroid function tests be drawn with lab work ordered today and sent to Dr. Allena Katz.

## 2023-04-05 NOTE — Patient Instructions (Signed)
Go to your pharmacy and let them know you need these shots: -Tdap (tetanus booster) - Shingles

## 2023-04-05 NOTE — Assessment & Plan Note (Signed)
Last A1c 5.7, stable.  Repeating A1c today.  Recommend decreasing sugar/simple carbohydrate intake and increasing physical activity.

## 2023-04-05 NOTE — Assessment & Plan Note (Signed)
Last lipid panel: LDL 42, HDL 32, triglycerides 146.  Continue simvastatin 40 mg daily.  Will continue to monitor.

## 2023-04-05 NOTE — Progress Notes (Signed)
Established Patient Office Visit  Subjective   Patient ID: Brian Craig, male    DOB: 1951/08/17  Age: 72 y.o. MRN: [id number]  Chief Complaint  Patient presents with   Diabetes   Hypertension    HPI Brian Craig is a 72 y.o. male presenting today for follow up of hypertension, hyperlipidemia, prediabetes. Hypertension: Followed by cardiology.  Patient here for follow-up of elevated blood pressure. He is not exercising and is not adherent to low salt diet.   Pt denies chest pain, SOB, dizziness, edema, syncope, fatigue or heart palpitations. Taking carvedilol, furosemide, Jardiance, lisinopril, reports excellent compliance with treatment. Denies side effects. Hyperlipidemia: tolerating simvastatin well with no myalgias or significant side effects.  The ASCVD Risk score (Arnett DK, et al., 2019) failed to calculate for the following reasons:   The valid total cholesterol range is 130 to 320 mg/dL Prediabetes: denies hypoglycemic events, wounds or sores that are not healing well, increased thirst or urination.   ROS Negative unless otherwise noted in HPI   Objective:     BP 120/82 (BP Location: Left Arm, Patient Position: Sitting, Cuff Size: Large)   Pulse 86   Resp 18   Ht 6' (1.829 m)   Wt (!) 342 lb (155.1 kg)   SpO2 95%   BMI 46.38 kg/m   Physical Exam Vitals reviewed.  Constitutional:      General: He is not in acute distress.    Appearance: Normal appearance. He is not ill-appearing.  HENT:     Head: Normocephalic and atraumatic.     Nose: Nose normal.  Eyes:     Conjunctiva/sclera: Conjunctivae normal.  Cardiovascular:     Rate and Rhythm: Normal rate and regular rhythm.     Pulses: Normal pulses.     Heart sounds: Normal heart sounds.  Pulmonary:     Effort: Pulmonary effort is normal.     Breath sounds: Normal breath sounds.  Musculoskeletal:     Cervical back: Normal range of motion.  Skin:    General: Skin is warm and dry.     Coloration: Skin is not  jaundiced or pale.     Findings: No bruising.  Neurological:     Mental Status: He is alert and oriented to person, place, and time.  Psychiatric:        Mood and Affect: Mood normal.     Assessment & Plan:  Essential hypertension Assessment & Plan: Followed by cardiology.  Controlled.  Continue carvedilol 80 mg daily, furosemide 40 mg daily, Jardiance 10 mg daily, lisinopril 10 mg daily.  Correcting CMP for medication monitoring.  Orders: -     CBC with Differential/Platelet; Future -     Comprehensive metabolic panel; Future  Hyperlipidemia, unspecified hyperlipidemia type Assessment & Plan: Last lipid panel: LDL 42, HDL 32, triglycerides 146.  Continue simvastatin 40 mg daily.  Will continue to monitor.  Orders: -     CBC with Differential/Platelet; Future -     Comprehensive metabolic panel; Future  Prediabetes Assessment & Plan: Last A1c 5.7, stable.  Repeating A1c today.  Recommend decreasing sugar/simple carbohydrate intake and increasing physical activity.  Orders: -     Hemoglobin A1c; Future  Postablative hypothyroidism Assessment & Plan: Followed by endocrinology.  He is requesting that thyroid function tests be drawn with lab work ordered today and sent to Dr. Allena Katz.  Orders: -     TSH; Future -     T4, free; Future -     T3,  free; Future  Anxiety Assessment & Plan: Stable.  Continue Zoloft 50 mg daily, refilled on 04/01/2023.  PDMP reviewed, no aberrancies.  Refill of alprazolam 0.5 mg to take as needed up to 3 times a day for anxiety sent.  Orders: -     ALPRAZolam; Take 1 tablet (0.5 mg total) by mouth 3 (three) times daily as needed for anxiety.  Dispense: 30 tablet; Refill: 0  Need for tetanus booster -     Tetanus-Diphth-Acell Pertussis; Inject 0.5 mLs into the muscle once for 1 dose.  Dispense: 0.5 mL; Refill: 0  Need for shingles vaccine -     Shingrix; Inject 0.5 mLs into the muscle once for 1 dose. Administer at 0 and 2-6 months for 2 total  doses.  To be administered by pharmacy.  Dispense: 0.5 mL; Refill: 0  Screening for colorectal cancer -     Cologuard   Return in about 1 week (around 04/12/2023) for fasting blood work; 4 months for follow up of HTN, HLD, prediabetes.    Melida Quitter, PA

## 2023-04-05 NOTE — Assessment & Plan Note (Signed)
Followed by cardiology.  Controlled.  Continue carvedilol 80 mg daily, furosemide 40 mg daily, Jardiance 10 mg daily, lisinopril 10 mg daily.  Correcting CMP for medication monitoring.

## 2023-04-05 NOTE — Assessment & Plan Note (Signed)
Stable.  Continue Zoloft 50 mg daily, refilled on 04/01/2023.  PDMP reviewed, no aberrancies.  Refill of alprazolam 0.5 mg to take as needed up to 3 times a day for anxiety sent.

## 2023-04-06 ENCOUNTER — Other Ambulatory Visit: Payer: Self-pay | Admitting: Cardiovascular Disease

## 2023-04-06 NOTE — Telephone Encounter (Signed)
Pt last saw Dr Royann Shivers 11/15/22, last labs 09/20/22 Creat 1.03, age 72, weight 155.1kg, CrCl 144.31, based on CrCl pt is on appropriate dosage of Pradaxa  BID for afib.  Will refill rx.

## 2023-04-12 ENCOUNTER — Other Ambulatory Visit: Payer: Medicare Other

## 2023-04-12 DIAGNOSIS — R7303 Prediabetes: Secondary | ICD-10-CM | POA: Diagnosis not present

## 2023-04-12 DIAGNOSIS — E890 Postprocedural hypothyroidism: Secondary | ICD-10-CM | POA: Diagnosis not present

## 2023-04-12 DIAGNOSIS — I10 Essential (primary) hypertension: Secondary | ICD-10-CM | POA: Diagnosis not present

## 2023-04-12 DIAGNOSIS — E785 Hyperlipidemia, unspecified: Secondary | ICD-10-CM

## 2023-04-13 LAB — COMPREHENSIVE METABOLIC PANEL
ALT: 15 IU/L (ref 0–44)
AST: 20 IU/L (ref 0–40)
Albumin/Globulin Ratio: 1.5 (ref 1.2–2.2)
Albumin: 4.1 g/dL (ref 3.8–4.8)
Alkaline Phosphatase: 92 IU/L (ref 44–121)
BUN/Creatinine Ratio: 10 (ref 10–24)
BUN: 11 mg/dL (ref 8–27)
Bilirubin Total: 0.6 mg/dL (ref 0.0–1.2)
CO2: 19 mmol/L — ABNORMAL LOW (ref 20–29)
Calcium: 9.4 mg/dL (ref 8.6–10.2)
Chloride: 98 mmol/L (ref 96–106)
Creatinine, Ser: 1.1 mg/dL (ref 0.76–1.27)
Globulin, Total: 2.7 g/dL (ref 1.5–4.5)
Glucose: 104 mg/dL — ABNORMAL HIGH (ref 70–99)
Potassium: 4 mmol/L (ref 3.5–5.2)
Sodium: 138 mmol/L (ref 134–144)
Total Protein: 6.8 g/dL (ref 6.0–8.5)
eGFR: 72 mL/min/{1.73_m2} (ref >59)

## 2023-04-13 LAB — CBC WITH DIFFERENTIAL/PLATELET
Basophils Absolute: 0.1 10*3/uL (ref 0.0–0.2)
Basos: 1 %
EOS (ABSOLUTE): 0.2 10*3/uL (ref 0.0–0.4)
Eos: 2 %
Hematocrit: 50.5 % (ref 37.5–51.0)
Hemoglobin: 16.8 g/dL (ref 13.0–17.7)
Immature Grans (Abs): 0 10*3/uL (ref 0.0–0.1)
Immature Granulocytes: 0 %
Lymphocytes Absolute: 3.3 10*3/uL — ABNORMAL HIGH (ref 0.7–3.1)
Lymphs: 34 %
MCH: 32.3 pg (ref 26.6–33.0)
MCHC: 33.3 g/dL (ref 31.5–35.7)
MCV: 97 fL (ref 79–97)
Monocytes Absolute: 1 10*3/uL — ABNORMAL HIGH (ref 0.1–0.9)
Monocytes: 10 %
Neutrophils Absolute: 5.1 10*3/uL (ref 1.4–7.0)
Neutrophils: 53 %
Platelets: 234 10*3/uL (ref 150–450)
RBC: 5.2 x10E6/uL (ref 4.14–5.80)
RDW: 12.2 % (ref 11.6–15.4)
WBC: 9.7 10*3/uL (ref 3.4–10.8)

## 2023-04-13 LAB — T3, FREE: T3, Free: 2.3 pg/mL (ref 2.0–4.4)

## 2023-04-13 LAB — T4, FREE: Free T4: 1.29 ng/dL (ref 0.82–1.77)

## 2023-04-13 LAB — TSH: TSH: 15.6 u[IU]/mL — ABNORMAL HIGH (ref 0.450–4.500)

## 2023-04-13 LAB — HEMOGLOBIN A1C
Est. average glucose Bld gHb Est-mCnc: 120 mg/dL
Hgb A1c MFr Bld: 5.8 % — ABNORMAL HIGH (ref 4.8–5.6)

## 2023-04-19 ENCOUNTER — Encounter: Payer: Self-pay | Admitting: Cardiovascular Disease

## 2023-04-19 NOTE — Telephone Encounter (Signed)
Remote transmission reviewed and shows normal device function. No episodes noted. Fluid level appears WNL. Routing for f/u.

## 2023-04-30 ENCOUNTER — Encounter: Payer: Self-pay | Admitting: Cardiovascular Disease

## 2023-04-30 ENCOUNTER — Other Ambulatory Visit: Payer: Self-pay | Admitting: Cardiovascular Disease

## 2023-04-30 DIAGNOSIS — Z79899 Other long term (current) drug therapy: Secondary | ICD-10-CM

## 2023-05-02 NOTE — Telephone Encounter (Signed)
The medications (Ozempic and Jardiance) can be used together for CHF and weight loss. Can we please refer to Pharm D for Ozempic initiation.

## 2023-05-03 NOTE — Telephone Encounter (Signed)
LMOM to talk to patient and schedule

## 2023-05-06 NOTE — Telephone Encounter (Signed)
Spoke with patient and explained that weight loss meds are not currently not indicated for CHF (although there has been shown benefit).   Will keep scheduled appointment for June, as there may be FDA label changes.  Pt has [employer] for Life - will run test claim to see if covered.

## 2023-05-11 ENCOUNTER — Ambulatory Visit (INDEPENDENT_AMBULATORY_CARE_PROVIDER_SITE_OTHER): Payer: Medicare Other

## 2023-05-11 DIAGNOSIS — I442 Atrioventricular block, complete: Secondary | ICD-10-CM | POA: Diagnosis not present

## 2023-05-12 LAB — CUP PACEART REMOTE DEVICE CHECK
Battery Remaining Longevity: 77 mo
Battery Voltage: 3 V
Brady Statistic AP VP Percent: 0 %
Brady Statistic AP VS Percent: 0 %
Brady Statistic AS VP Percent: 0 %
Brady Statistic AS VS Percent: 0 %
Brady Statistic RA Percent Paced: 0 %
Brady Statistic RV Percent Paced: 99.57 %
Date Time Interrogation Session: 20240529052824
HighPow Impedance: 137 Ohm
HighPow Impedance: 147 Ohm
Implantable Lead Connection Status: 753985
Implantable Lead Connection Status: 753985
Implantable Lead Connection Status: 753985
Implantable Lead Implant Date: 20071217
Implantable Lead Implant Date: 20071217
Implantable Lead Implant Date: 20071217
Implantable Lead Location: 753858
Implantable Lead Location: 753859
Implantable Lead Location: 753860
Implantable Lead Model: 4194
Implantable Lead Model: 5076
Implantable Lead Model: 6947
Implantable Pulse Generator Implant Date: 20230830
Lead Channel Impedance Value: 1197 Ohm
Lead Channel Impedance Value: 361 Ohm
Lead Channel Impedance Value: 399 Ohm
Lead Channel Impedance Value: 760 Ohm
Lead Channel Impedance Value: 874 Ohm
Lead Channel Impedance Value: 950 Ohm
Lead Channel Pacing Threshold Amplitude: 0.75 V
Lead Channel Pacing Threshold Amplitude: 1.625 V
Lead Channel Pacing Threshold Pulse Width: 0.4 ms
Lead Channel Pacing Threshold Pulse Width: 0.8 ms
Lead Channel Setting Pacing Amplitude: 2.5 V
Lead Channel Setting Pacing Amplitude: 2.5 V
Lead Channel Setting Pacing Pulse Width: 0.4 ms
Lead Channel Setting Pacing Pulse Width: 0.8 ms
Lead Channel Setting Sensing Sensitivity: 0.3 mV

## 2023-05-16 ENCOUNTER — Telehealth: Payer: Self-pay | Admitting: Pharmacist Clinician (PhC)/ Clinical Pharmacy Specialist

## 2023-05-16 ENCOUNTER — Telehealth: Payer: Self-pay

## 2023-05-16 ENCOUNTER — Other Ambulatory Visit (HOSPITAL_COMMUNITY): Payer: Self-pay

## 2023-05-16 NOTE — Telephone Encounter (Signed)
Could you please run a test claim for [facility].

## 2023-05-16 NOTE — Telephone Encounter (Signed)
Pharmacy Patient Advocate Encounter  Received notification from [facility] that the request for prior authorization for [facility] has been denied due to   (PATIENT MUST TRY THE FOLLOWING) PLEASE SEE COMPLETE DENIAL IN THE PATIENTS CHART)  .    Please be advised we currently do not have a Pharmacist to review denials, therefore you will need to process appeals accordingly as needed. Thanks for your support at this time.

## 2023-05-16 NOTE — Telephone Encounter (Signed)
Pharmacy Patient Advocate Encounter   Received notification from [employer] that prior authorization for [facility] is required/requested.   PA submitted to [facility] via CoverMyMeds Key or (Medicaid) confirmation # [id number]   Status is pending    

## 2023-05-16 NOTE — Telephone Encounter (Signed)
Pharmacy Patient Advocate Encounter   Received notification from [facility] that prior authorization for [facility] is required/requested.   PA submitted to [employer] via CoverMyMeds Key or [facility]) confirmation # [id number]   Status is pending

## 2023-06-02 ENCOUNTER — Ambulatory Visit: Payer: Medicare Other

## 2023-06-02 NOTE — Progress Notes (Signed)
Remote ICD transmission.   

## 2023-06-06 ENCOUNTER — Other Ambulatory Visit: Payer: Self-pay | Admitting: Cardiovascular Disease

## 2023-06-07 ENCOUNTER — Other Ambulatory Visit: Payer: Self-pay | Admitting: Family Medicine

## 2023-06-07 DIAGNOSIS — F419 Anxiety disorder, unspecified: Secondary | ICD-10-CM

## 2023-06-10 NOTE — Telephone Encounter (Signed)
Patient is asking that we reapply for prior authorization as insurance is now telling him that it will be < $70 for 3 month supply with PA.

## 2023-07-07 ENCOUNTER — Other Ambulatory Visit (HOSPITAL_COMMUNITY): Payer: Self-pay

## 2023-07-07 ENCOUNTER — Telehealth: Payer: Self-pay

## 2023-07-07 NOTE — Telephone Encounter (Signed)
Pharmacy Patient Advocate Encounter   Received notification from  [place]  that prior authorization for [facility] is required/requested.   Insurance verification completed.   The patient is insured through [employer] .   Per test claim: PA required; PA submitted to [employer] via CoverMyMeds Key/confirmation #/EOC [id number] Status is pending

## 2023-07-08 ENCOUNTER — Other Ambulatory Visit (HOSPITAL_COMMUNITY): Payer: Self-pay

## 2023-07-08 NOTE — Telephone Encounter (Addendum)
Pharmacy Patient Advocate Encounter  Received notification from [employer] that Prior Authorization for [facility] has been DENIED. Please advise how you'd like to proceed. Full denial letter will be uploaded to the media tab. See denial reason below.

## 2023-07-28 ENCOUNTER — Other Ambulatory Visit (HOSPITAL_COMMUNITY): Payer: Self-pay

## 2023-07-28 NOTE — Telephone Encounter (Signed)
Appeal determination was indexed to chart on 07/20/23. Reconsideration was denied. Please see media for full denial reason.   Received notification from [employer] that Prior Authorization for [facility] has been DENIED. Please advise how you'd like to proceed. Full denial letter will be uploaded to the media tab. See denial reason below.

## 2023-08-05 ENCOUNTER — Encounter: Payer: Self-pay | Admitting: Family Medicine

## 2023-08-05 ENCOUNTER — Ambulatory Visit: Payer: Medicare Other | Admitting: Family Medicine

## 2023-08-05 VITALS — BP 120/80 | HR 85 | Resp 18 | Ht 72.0 in | Wt 345.0 lb

## 2023-08-05 DIAGNOSIS — F419 Anxiety disorder, unspecified: Secondary | ICD-10-CM | POA: Diagnosis not present

## 2023-08-05 DIAGNOSIS — E785 Hyperlipidemia, unspecified: Secondary | ICD-10-CM

## 2023-08-05 DIAGNOSIS — I10 Essential (primary) hypertension: Secondary | ICD-10-CM

## 2023-08-05 DIAGNOSIS — R7303 Prediabetes: Secondary | ICD-10-CM

## 2023-08-05 LAB — POCT GLYCOSYLATED HEMOGLOBIN (HGB A1C): HbA1c POC (<> result, manual entry): 6.1 % (ref 4.0–5.6)

## 2023-08-05 MED ORDER — ALPRAZOLAM 0.5 MG PO TABS
0.5000 mg | ORAL_TABLET | Freq: Three times a day (TID) | ORAL | 0 refills | Status: DC | PRN
Start: 2023-08-05 — End: 2023-09-20

## 2023-08-05 MED ORDER — SERTRALINE HCL 50 MG PO TABS
50.0000 mg | ORAL_TABLET | Freq: Every day | ORAL | 0 refills | Status: DC
Start: 2023-08-05 — End: 2023-11-07

## 2023-08-05 NOTE — Patient Instructions (Addendum)
I would like to see you again in 3 months so that we can recheck your A1c and make sure that we prevented from increasing to the point of meeting diagnostic criteria for diabetes.  We will also check your lab work for kidney and liver function and your cholesterol levels at that time!  Make sure to send in your Cologuard kit too!

## 2023-08-05 NOTE — Progress Notes (Signed)
Established Patient Office Visit  Subjective   Patient ID: Brian Craig, male    DOB: 01/05/1951  Age: 72 y.o. MRN: [id number]  Chief Complaint  Patient presents with   Prediabetes   Hyperlipidemia   Hypertension    HPI Brian Craig is a 72 y.o. male presenting today for follow up of hypertension, hyperlipidemia, prediabetes. Hypertension:  Pt denies chest pain, SOB, dizziness, edema, syncope, fatigue or heart palpitations. Taking lisinopril, furosemide, carvedilol, reports excellent compliance with treatment. Denies side effects. Hyperlipidemia: tolerating simvastatin well with no myalgias or significant side effects.  He also sees cardiology periodically. The ASCVD Risk score (Arnett DK, et al., 2019) failed to calculate for the following reasons:   The valid total cholesterol range is 130 to 320 mg/dL Prediabetes: denies hypoglycemic events, wounds or sores that are not healing well, increased thirst or urination.  He admits that he has been eating higher amounts than usual which may explain his slight increase in A1c.  ROS Negative unless otherwise noted in HPI   Objective:     BP 120/80 (BP Location: Left Arm, Patient Position: Sitting, Cuff Size: Large)   Pulse 85   Resp 18   Ht 6' (1.829 m)   Wt (!) 345 lb (156.5 kg)   SpO2 95%   BMI 46.79 kg/m   Physical Exam Constitutional:      General: He is not in acute distress.    Appearance: Normal appearance.  HENT:     Head: Normocephalic and atraumatic.  Cardiovascular:     Rate and Rhythm: Normal rate and regular rhythm.     Heart sounds: Normal heart sounds. No murmur heard.    No friction rub. No gallop.  Pulmonary:     Effort: Pulmonary effort is normal. No respiratory distress.     Breath sounds: Normal breath sounds. No wheezing, rhonchi or rales.  Skin:    General: Skin is warm and dry.  Neurological:     Mental Status: He is alert and oriented to person, place, and time.  Psychiatric:        Mood and  Affect: Mood normal.    Results for orders placed or performed in visit on 08/05/23  POCT HgB A1C  Result Value Ref Range   Hemoglobin A1C     HbA1c POC (<> result, manual entry) 6.1 4.0 - 5.6 %   HbA1c, POC (prediabetic range)     HbA1c, POC (controlled diabetic range)       Assessment & Plan:  Essential hypertension Assessment & Plan: Followed by cardiology.  Controlled.  Continue carvedilol 80 mg daily, furosemide 40 mg daily, Jardiance 10 mg daily, lisinopril 10 mg daily.  Will continue to monitor.   Hyperlipidemia, unspecified hyperlipidemia type Assessment & Plan: Last lipid panel: LDL 42, HDL 32, triglycerides 146.  Continue simvastatin 40 mg daily.  Will continue to monitor.  Repeating lipid panel and CMP for medication monitoring at next appointment.   Prediabetes Assessment & Plan: A1c today 6.1, increased from 5.8 at last appointment.  Recommend decreasing sugar/carbohydrate intake and increasing physical activity.  We will recheck A1c sooner this time in 3 months to hopefully prevent progression to diabetes.  He also states that he may be starting compounded semaglutide as prescribed by a provider that he found in [place].  Orders: -     POCT glycosylated hemoglobin (Hb A1C)  Anxiety Assessment & Plan: Stable.  Continue Zoloft 50 mg daily, refilled today.  PDMP reviewed, no aberrancies.  Refill  of alprazolam 0.5 mg #30 to take as needed up to 3 times a day for anxiety sent today as well.  Orders: -     ALPRAZolam; Take 1 tablet (0.5 mg total) by mouth 3 (three) times daily as needed for anxiety.  Dispense: 30 tablet; Refill: 0 -     Sertraline HCl; Take 1 tablet (50 mg total) by mouth daily.  Dispense: 90 tablet; Refill: 0    Return in about 3 months (around 11/05/2023) for follow-up for HTN, HLD, prediabetes, fasting blood work 1 week before.    Melida Quitter, PA

## 2023-08-05 NOTE — Assessment & Plan Note (Signed)
Last lipid panel: LDL 42, HDL 32, triglycerides 146.  Continue simvastatin 40 mg daily.  Will continue to monitor.  Repeating lipid panel and CMP for medication monitoring at next appointment.

## 2023-08-05 NOTE — Assessment & Plan Note (Addendum)
A1c today 6.1, increased from 5.8 at last appointment.  Recommend decreasing sugar/carbohydrate intake and increasing physical activity.  We will recheck A1c sooner this time in 3 months to hopefully prevent progression to diabetes.  He also states that he may be starting compounded semaglutide as prescribed by a provider that he found in [place].

## 2023-08-05 NOTE — Assessment & Plan Note (Signed)
Followed by cardiology.  Controlled.  Continue carvedilol 80 mg daily, furosemide 40 mg daily, Jardiance 10 mg daily, lisinopril 10 mg daily.  Will continue to monitor.

## 2023-08-05 NOTE — Assessment & Plan Note (Signed)
Stable.  Continue Zoloft 50 mg daily, refilled today.  PDMP reviewed, no aberrancies.  Refill of alprazolam 0.5 mg #30 to take as needed up to 3 times a day for anxiety sent today as well.

## 2023-08-10 ENCOUNTER — Ambulatory Visit (INDEPENDENT_AMBULATORY_CARE_PROVIDER_SITE_OTHER): Payer: Medicare Other

## 2023-08-10 DIAGNOSIS — I442 Atrioventricular block, complete: Secondary | ICD-10-CM | POA: Diagnosis not present

## 2023-08-10 LAB — CUP PACEART REMOTE DEVICE CHECK
Battery Remaining Longevity: 73 mo
Battery Voltage: 3 V
Brady Statistic AP VP Percent: 0 %
Brady Statistic AP VS Percent: 0 %
Brady Statistic AS VP Percent: 0 %
Brady Statistic AS VS Percent: 0 %
Brady Statistic RA Percent Paced: 0 %
Brady Statistic RV Percent Paced: 99.49 %
Date Time Interrogation Session: 20240828022724
HighPow Impedance: 254 Ohm
HighPow Impedance: 254 Ohm
Implantable Lead Connection Status: 753985
Implantable Lead Connection Status: 753985
Implantable Lead Connection Status: 753985
Implantable Lead Implant Date: 20071217
Implantable Lead Implant Date: 20071217
Implantable Lead Implant Date: 20071217
Implantable Lead Location: 753858
Implantable Lead Location: 753859
Implantable Lead Location: 753860
Implantable Lead Model: 4194
Implantable Lead Model: 5076
Implantable Lead Model: 6947
Implantable Pulse Generator Implant Date: 20230830
Lead Channel Impedance Value: 1178 Ohm
Lead Channel Impedance Value: 361 Ohm
Lead Channel Impedance Value: 399 Ohm
Lead Channel Impedance Value: 703 Ohm
Lead Channel Impedance Value: 874 Ohm
Lead Channel Impedance Value: 893 Ohm
Lead Channel Pacing Threshold Amplitude: 0.875 V
Lead Channel Pacing Threshold Amplitude: 1.125 V
Lead Channel Pacing Threshold Pulse Width: 0.4 ms
Lead Channel Pacing Threshold Pulse Width: 0.8 ms
Lead Channel Setting Pacing Amplitude: 2.5 V
Lead Channel Setting Pacing Amplitude: 2.5 V
Lead Channel Setting Pacing Pulse Width: 0.4 ms
Lead Channel Setting Pacing Pulse Width: 0.8 ms
Lead Channel Setting Sensing Sensitivity: 0.3 mV

## 2023-08-17 ENCOUNTER — Ambulatory Visit: Payer: Medicare Other | Attending: Cardiovascular Disease | Admitting: Cardiovascular Disease

## 2023-08-17 ENCOUNTER — Encounter: Payer: Self-pay | Admitting: Cardiovascular Disease

## 2023-08-17 VITALS — BP 110/76 | HR 74 | Ht 72.0 in | Wt 345.0 lb

## 2023-08-17 DIAGNOSIS — E785 Hyperlipidemia, unspecified: Secondary | ICD-10-CM

## 2023-08-17 DIAGNOSIS — I5042 Chronic combined systolic (congestive) and diastolic (congestive) heart failure: Secondary | ICD-10-CM | POA: Diagnosis not present

## 2023-08-17 DIAGNOSIS — D6869 Other thrombophilia: Secondary | ICD-10-CM | POA: Diagnosis not present

## 2023-08-17 DIAGNOSIS — Z95810 Presence of automatic (implantable) cardiac defibrillator: Secondary | ICD-10-CM

## 2023-08-17 DIAGNOSIS — R7303 Prediabetes: Secondary | ICD-10-CM | POA: Diagnosis not present

## 2023-08-17 DIAGNOSIS — E890 Postprocedural hypothyroidism: Secondary | ICD-10-CM

## 2023-08-17 DIAGNOSIS — I4821 Permanent atrial fibrillation: Secondary | ICD-10-CM | POA: Diagnosis not present

## 2023-08-17 DIAGNOSIS — G4733 Obstructive sleep apnea (adult) (pediatric): Secondary | ICD-10-CM | POA: Diagnosis not present

## 2023-08-17 DIAGNOSIS — T82110S Breakdown (mechanical) of cardiac electrode, sequela: Secondary | ICD-10-CM

## 2023-08-17 DIAGNOSIS — I442 Atrioventricular block, complete: Secondary | ICD-10-CM | POA: Diagnosis not present

## 2023-08-17 NOTE — Progress Notes (Signed)
Patient ID: Brian Craig, male   DOB: Apr 29, 1951, 72 y.o.   MRN: [id number]     Cardiology Office Note    Date:  08/21/2023   ID:  Brian Craig, DOB 04/04/1951, MRN [id number]  PCP:  Melida Quitter, PA  Cardiologist:   Thurmon Fair, MD   Chief Complaint  Patient presents with   Atrial Fibrillation    History of Present Illness:  Brian Craig is a 72 y.o. male who presents for permanent atrial fibrillation, nonischemic cardiomyopathy, complete heart block status post AV node ablation, biventricular defibrillator check. He underwent a recent CRT-D generator check in August 2023, but due to high-voltage coil impedance out of range, tachy detection and therapies are off.  He is doing well.  He has no cardiovascular complaints.  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  Rarely has mild orthostatic dizziness, otherwise denies angina or dyspnea at rest or with activity, palpitations, syncope, lower extremity edema, falls or bleeding problems.  Has not made any progress with weight loss.  With denied GLP-1 agonist coverage by his insurance.  Taking Jardiance 10 mg once daily.  Metabolic control is good with a hemoglobin A1c of 5.8% and LDL cholesterol 42 (chronically low HDL at 32.  Most recent creatinine normal at 1.1.  Reports compliance with CPAP and denies daytime hypersomnolence.  Most recent TSH remains high at 15.6, although free T4 and free T3 are normal range.4  Device interrogation shows normal function.  Estimated generator longevity is 6 years.  He has 99.2% ventricular pacing, otherwise PVCs.  He does have an underlying idioventricular escape rhythm, but has had AV node ablation.  OptiVol/thoracic impedance is in normal range.  He has not had any episodes of high ventricular rates. In the past he had episodes of accelerated idioventricular rhythm and 2017 and 2018 but none recorded since his generator change out in August  2023.    Brian Craig has a history of severe dilated cardiomyopathy related to uncontrolled atrial fibrillation with rapid ventricular response that started in the setting of thyrotoxicosis. His left ventricular ejection fraction was as low as 17%. Cardioversion failed. Eventually underwent AV node ablation and received a biventricular pacemaker-defibrillator in 2007. He underwent a generator change out in 2012 (atrial lead Medtronic 5076, ICD lead Medtronic [phone] Sprint Quattro secure, LV lead Medtronic 4194 Attain bipolar, Medtronic Viva XT gen change May 2016). His left ventricular ejection fraction has improved substantially since AV node ablation. His most recent echocardiogram performed in August 2015 shows normal left ventricular systolic function. He remains in permanent atrial fibrillation.     Past Medical History:  Diagnosis Date   Atrial fibrillation (HCC)    CHF (congestive heart failure) (HCC)    Graves disease    Graves   Hyperlipidemia    Hypertension    OSA treated with BiPAP    Substance abuse (HCC)    alcohol    Past Surgical History:  Procedure Laterality Date   APPENDECTOMY     BIV ICD GENERATOR CHANGEOUT N/A 08/11/2022   Procedure: BIV ICD GENERATOR CHANGEOUT;  Surgeon: Duke Salvia, MD;  [facility] INVASIVE CV LAB;  Service: Cardiovascular;  Laterality: N/A;   BIV ICD GENERTAOR CHANGE OUT Left 2012   Medtronic Protecta   CARDIAC ELECTROPHYSIOLOGY STUDY AND ABLATION     EP IMPLANTABLE DEVICE N/A 05/06/2015   Procedure: ICD Generator Changeout;  Surgeon: Thurmon Fair, MD;  Location: [facility] INVASIVE CV  LAB;  Service: Cardiovascular;  Laterality: N/A;   HERNIA REPAIR     MENISCUS REPAIR Right    VASECTOMY     unilateral due to injury    Outpatient Medications Prior to Visit  Medication Sig Dispense Refill   acetaminophen (TYLENOL) 500 MG tablet Take 1,000 mg by mouth every 6 (six) hours as needed for moderate pain.     ALPRAZolam (XANAX) 0.5 MG tablet Take 1  tablet (0.5 mg total) by mouth 3 (three) times daily as needed for anxiety. 30 tablet 0   Carboxymethylcellulose Sodium (LUBRICANT EYE DROPS OP) Place 1 drop into both eyes at bedtime.     carvedilol (COREG CR) 80 MG 24 hr capsule TAKE 1 CAPSULE DAILY 90 capsule 1   cholecalciferol (VITAMIN D3) 25 MCG (1000 UNIT) tablet Take 1,000 Units by mouth daily.     dabigatran (PRADAXA) 150 MG CAPS capsule TAKE 1 CAPSULE TWICE DAILY 180 capsule 1   furosemide (LASIX) 40 MG tablet TAKE 1 TABLET (40 MG) EVERY OTHER DAY ALTERNATING WITH 2 TABLETS (80 MG) EVERY OTHER DAY 180 tablet 0   JARDIANCE 10 MG TABS tablet TAKE 1 TABLET DAILY BEFORE BREAKFAST 90 tablet 3   levothyroxine (SYNTHROID) 125 MCG tablet Take 125 mcg by mouth daily before breakfast. Patient takes 2 tablets every morning before breakfast     lisinopril (ZESTRIL) 10 MG tablet TAKE 1 TABLET TWICE A DAY 180 tablet 3   Melatonin 5 MG CAPS Take 5 mg by mouth at bedtime.     Multiple Vitamin (MULTIVITAMIN) tablet Take 1 tablet by mouth daily.     potassium chloride (KLOR-CON) 10 MEQ tablet TAKE 1 TABLET DAILY 90 tablet 3   sertraline (ZOLOFT) 50 MG tablet Take 1 tablet (50 mg total) by mouth daily. 90 tablet 0   simvastatin (ZOCOR) 40 MG tablet TAKE 1 TABLET DAILY AT 6 P.M. 90 tablet 1   No facility-administered medications prior to visit.     Allergies:   Patient has no known allergies.   Social History   Socioeconomic History   Marital status: Married    Spouse name: Not on file   Number of children: Not on file   Years of education: Not on file   Highest education level: Not on file  Occupational History   Not on file  Tobacco Use   Smoking status: Every Day    Current packs/day: 0.50    Average packs/day: 0.5 packs/day for 51.0 years (25.5 ttl pk-yrs)    Types: Cigarettes    Passive exposure: Never   Smokeless tobacco: Never   Tobacco comments:    Half of pack daily    08/16/2024 Patient smoke 1/2 pack daily  Substance and  Sexual Activity   Alcohol use: No    Comment: recovering alcoholic   Drug use: No   Sexual activity: Yes    Birth control/protection: None  Other Topics Concern   Not on file  Social History Narrative   Not on file   Social Determinants of Health   Financial Resource Strain: Low Risk  (11/22/2022)   Overall Financial Resource Strain (CARDIA)    Difficulty of Paying Living Expenses: Not hard at all  Food Insecurity: No Food Insecurity (11/22/2022)   Hunger Vital Sign    Worried About Running Out of Food in the Last Year: Never true    Ran Out of Food in the Last Year: Never true  Transportation Needs: No Transportation Needs (11/22/2022)   PRAPARE - Transportation  Lack of Transportation (Medical): No    Lack of Transportation (Non-Medical): No  Physical Activity: Inactive (11/22/2022)   Exercise Vital Sign    Days of Exercise per Week: 0 days    Minutes of Exercise per Session: 0 min  Stress: No Stress Concern Present (11/22/2022)   [employer] of Occupational Health - Occupational Stress Questionnaire    Feeling of Stress : Not at all  Social Connections: Moderately Isolated (11/22/2022)   Social Connection and Isolation Panel [NHANES]    Frequency of Communication with Friends and Family: More than three times a week    Frequency of Social Gatherings with Friends and Family: Once a week    Attends Religious Services: Never    Database administrator or Organizations: No    Attends Engineer, structural: Never    Marital Status: Married     Family History:  The patient's family history includes Alcohol abuse in his maternal grandfather and maternal uncle; Heart attack in his father; Heart disease in his father, mother, paternal aunt, and paternal uncle; Hyperlipidemia in his father; Hypertension in his father and mother.   ROS:   Please see the history of present illness.    ROS All other systems reviewed and are negative.   PHYSICAL EXAM:   VS:  BP  110/76 (BP Location: Left Arm, Patient Position: Sitting, Cuff Size: Large)   Pulse 74   Ht 6' (1.829 m)   Wt (!) 345 lb (156.5 kg)   SpO2 98%   BMI 46.79 kg/m      General: Alert, oriented x3, no distress, healthy device surgical site Head: no evidence of trauma, PERRL, EOMI, no exophtalmos or lid lag, no myxedema, no xanthelasma; normal ears, nose and oropharynx Neck: normal jugular venous pulsations and no hepatojugular reflux; brisk carotid pulses without delay and no carotid bruits Chest: clear to auscultation, no signs of consolidation by percussion or palpation, normal fremitus, symmetrical and full respiratory excursions Cardiovascular: normal position and quality of the apical impulse, regular rhythm, normal first and second heart sounds, no murmurs, rubs or gallops Abdomen: no tenderness or distention, no masses by palpation, no abnormal pulsatility or arterial bruits, normal bowel sounds, no hepatosplenomegaly Extremities: no clubbing, cyanosis or edema; 2+ radial, ulnar and brachial pulses bilaterally; 2+ right femoral, posterior tibial and dorsalis pedis pulses; 2+ left femoral, posterior tibial and dorsalis pedis pulses; no subclavian or femoral bruits Neurological: grossly nonfocal Psych: Normal mood and affect    Wt Readings from Last 3 Encounters:  08/17/23 (!) 345 lb (156.5 kg)  08/05/23 (!) 345 lb (156.5 kg)  04/05/23 (!) 342 lb (155.1 kg)      Studies/Labs Reviewed:   EKG:  EKG is ordered today.  And shows background atrial fibrillation with biventricular pacing and positive R wave in lead V1.  QRS duration is 148 ms, QTc 486 ms.  Intracardiac electrogram shows BiV paced rhythm  Recent Labs: 04/12/2023: ALT 15; BUN 11; Creatinine, Ser 1.10; Hemoglobin 16.8; Platelets 234; Potassium 4.0; Sodium 138; TSH 15.600   Lipid Panel    Component Value Date/Time   CHOL 99 (L) 03/15/2022 0959   TRIG 146 03/15/2022 0959   HDL 32 (L) 03/15/2022 0959   CHOLHDL 3.1  03/15/2022 0959   CHOLHDL 3.6 02/09/2017 1103   VLDL 25 02/09/2017 1103   LDLCALC 42 03/15/2022 0959      ASSESSMENT:    1. CHB (complete heart block) (HCC)   2. Permanent atrial fibrillation (HCC)  3. Acquired thrombophilia (HCC)   4. Biventricular automatic implantable cardioverter defibrillator in situ   5. Failure of implantable cardioverter-defibrillator (ICD) lead, sequela   6. Chronic combined systolic and diastolic CHF, NYHA class 1 (HCC)   7. OSA on CPAP   8. Morbid obesity (HCC)   9. Prediabetes   10. Dyslipidemia (high LDL; low HDL)   11. Postablative hypothyroidism       PLAN:  In order of problems listed above:  1. CHB: s/p AV node ablation.  Pacemaker dependent 2. AFib: Permanent arrhythmia.  CHA2DS2-VASc score 2 (CHF, age) no history of previous stroke or other embolic events.   3. Anticoagulation: No falls or bleeding problems, stable hemoglobin. 4. CRT-D (programmed as CRT-P): Excellent biventricular pacing.  The tachycardia detection/therapies are turned off due to failure of the high-voltage circuit. 5. CHF: NYHA functional class I (but rather sedentary).  Fully recovered LVEF.   On ACE inhibitor and carvedilol.  Unlikely to tolerate higher doses since his blood pressure is low. On SGLT2 inhibitor.  Does also take loop diuretics.  Appears to be euvolemic both clinically and by the thoracic impedance reading. 6. OSA on CPAP: He reports 100% CPAP compliance.  Denies daytime hypersomnolence. 7. Obesity/prediabetes: Unfortunately, he has been denied coverage for GLP-1 agonists.  Acceptable glycemic control. 8.  Dyslipidemia: Excellent LDL cholesterol on statin.  Typical pattern for insulin resistance.  HDL will not improve without substantial weight loss. 9. Hypothyroidism (post ablation): TSH stubbornly high, although free T4 and free T3 levels are normal 10. Alcoholism in recovery: Remains abstinent   Medication Adjustments/Labs and Tests Ordered: Current  medicines are reviewed at length with the patient today.  Concerns regarding medicines are outlined above.  Medication changes, Labs and Tests ordered today are listed in the Patient Instructions below. Patient Instructions  Medication Instructions:  No changes *If you need a refill on your cardiac medications before your next appointment, please call your pharmacy*  Follow-Up: At [facility], you and your health needs are our priority.  As part of our continuing mission to provide you with exceptional heart care, we have created designated Provider Care Teams.  These Care Teams include your primary Cardiologist (physician) and Advanced Practice Providers (APPs -  Physician Assistants and Nurse Practitioners) who all work together to provide you with the care you need, when you need it.  We recommend signing up for the patient portal called "MyChart".  Sign up information is provided on this After Visit Summary.  MyChart is used to connect with patients for Virtual Visits (Telemedicine).  Patients are able to view lab/test results, encounter notes, upcoming appointments, etc.  Non-urgent messages can be sent to your provider as well.   To learn more about what you can do with MyChart, go to [email or web address].    Your next appointment:   1 year(s)  Provider:   Thurmon Fair, MD         Signed, Thurmon Fair, MD  08/21/2023 2:11 PM    [facility] [address] [place], [place], [place]  [address] Phone: [phone]; Fax: [phone]

## 2023-08-17 NOTE — Patient Instructions (Signed)

## 2023-08-19 NOTE — Progress Notes (Signed)
Remote ICD transmission.   

## 2023-08-21 ENCOUNTER — Encounter: Payer: Self-pay | Admitting: Cardiovascular Disease

## 2023-09-05 ENCOUNTER — Other Ambulatory Visit: Payer: Self-pay | Admitting: Cardiovascular Disease

## 2023-09-19 ENCOUNTER — Other Ambulatory Visit: Payer: Self-pay | Admitting: Cardiovascular Disease

## 2023-09-19 NOTE — Telephone Encounter (Signed)
Prescription refill request for Pradaxa received.  Indication:afib Last office visit:9/24 Weight:156.5  kg Age:72 Scr:1.10  4/24 CrCl:136.34  ml/min  Prescription refilled

## 2023-09-20 ENCOUNTER — Other Ambulatory Visit: Payer: Self-pay | Admitting: Family Medicine

## 2023-09-20 DIAGNOSIS — F419 Anxiety disorder, unspecified: Secondary | ICD-10-CM

## 2023-09-22 ENCOUNTER — Ambulatory Visit: Payer: Medicare Other | Admitting: Dermatology

## 2023-09-22 DIAGNOSIS — L821 Other seborrheic keratosis: Secondary | ICD-10-CM | POA: Diagnosis not present

## 2023-09-22 DIAGNOSIS — L814 Other melanin hyperpigmentation: Secondary | ICD-10-CM | POA: Diagnosis not present

## 2023-09-22 DIAGNOSIS — Z7189 Other specified counseling: Secondary | ICD-10-CM

## 2023-09-22 DIAGNOSIS — D229 Melanocytic nevi, unspecified: Secondary | ICD-10-CM

## 2023-09-22 DIAGNOSIS — D1801 Hemangioma of skin and subcutaneous tissue: Secondary | ICD-10-CM

## 2023-09-22 DIAGNOSIS — Z1283 Encounter for screening for malignant neoplasm of skin: Secondary | ICD-10-CM

## 2023-09-22 DIAGNOSIS — D492 Neoplasm of unspecified behavior of bone, soft tissue, and skin: Secondary | ICD-10-CM

## 2023-09-22 DIAGNOSIS — L578 Other skin changes due to chronic exposure to nonionizing radiation: Secondary | ICD-10-CM

## 2023-09-22 DIAGNOSIS — L719 Rosacea, unspecified: Secondary | ICD-10-CM

## 2023-09-22 DIAGNOSIS — Z872 Personal history of diseases of the skin and subcutaneous tissue: Secondary | ICD-10-CM

## 2023-09-22 DIAGNOSIS — L711 Rhinophyma: Secondary | ICD-10-CM | POA: Diagnosis not present

## 2023-09-22 DIAGNOSIS — W908XXA Exposure to other nonionizing radiation, initial encounter: Secondary | ICD-10-CM | POA: Diagnosis not present

## 2023-09-22 NOTE — Patient Instructions (Addendum)

## 2023-09-22 NOTE — Progress Notes (Signed)
Follow-Up Visit   Subjective  Brian Craig is a 72 y.o. male who presents for the following: Skin Cancer Screening and Full Body Skin Exam, hx of precancers   The patient presents for Total-Body Skin Exam (TBSE) for skin cancer screening and mole check. The patient has spots, moles and lesions to be evaluated, some may be new or changing and the patient may have concern these could be cancer.    The following portions of the chart were reviewed this encounter and updated as appropriate: medications, allergies, medical history  Review of Systems:  No other skin or systemic complaints except as noted in HPI or Assessment and Plan.  Objective  Well appearing patient in no apparent distress; mood and affect are within normal limits.  A full examination was performed including scalp, head, eyes, ears, nose, lips, neck, chest, axillae, abdomen, back, buttocks, bilateral upper extremities, bilateral lower extremities, hands, feet, fingers, toes, fingernails, and toenails. All findings within normal limits unless otherwise noted below.   Relevant physical exam findings are noted in the Assessment and Plan.  left sacral superior buttock 0.7 x 0.4 cm red irregular macule         face Clear skin     Assessment & Plan   SKIN CANCER SCREENING PERFORMED TODAY.  ACTINIC DAMAGE - Chronic condition, secondary to cumulative UV/sun exposure - diffuse scaly erythematous macules with underlying dyspigmentation - Recommend daily broad spectrum sunscreen SPF 30+ to sun-exposed areas, reapply every 2 hours as needed.  - Staying in the shade or wearing long sleeves, sun glasses (UVA+UVB protection) and wide brim hats (4-inch brim around the entire circumference of the hat) are also recommended for sun protection.  - Call for new or changing lesions.  LENTIGINES, SEBORRHEIC KERATOSES, HEMANGIOMAS - Benign normal skin lesions - Benign-appearing - Call for any changes  MELANOCYTIC NEVI -  Tan-brown and/or pink-flesh-colored symmetric macules and papules - Benign appearing on exam today - Observation - Call clinic for new or changing moles - Recommend daily use of broad spectrum spf 30+ sunscreen to sun-exposed areas.   Vascular birthmark Posterior neck, right ankle  Benign, observe.    Rosacea with Rhinophyma  Face/nose  Rosacea is a chronic progressive skin condition usually affecting the face of adults, causing redness and/or acne bumps. It is treatable but not curable. It sometimes affects the eyes (ocular rosacea) as well. It may respond to topical and/or systemic medication and can flare with stress, sun exposure, alcohol, exercise, topical steroids (including hydrocortisone/cortisone 10) and some foods.  Daily application of broad spectrum spf 30+ sunscreen to face is recommended to reduce flares.  Patient declines treatment   Neoplasm of skin left sacral superior buttock  Epidermal / dermal shaving  Lesion diameter (cm):  0.7 Informed consent: discussed and consent obtained   Timeout: patient name, date of birth, surgical site, and procedure verified   Procedure prep:  Patient was prepped and draped in usual sterile fashion Prep type:  Isopropyl alcohol Anesthesia: the lesion was anesthetized in a standard fashion   Anesthetic:  1% lidocaine w/ epinephrine 1-100,000 buffered w/ 8.4% NaHCO3 Hemostasis achieved with: pressure, aluminum chloride and electrodesiccation   Outcome: patient tolerated procedure well   Post-procedure details: sterile dressing applied and wound care instructions given   Dressing type: bandage and petrolatum    Specimen 1 - Surgical pathology Differential Diagnosis: R/O Dysplastic nevus  vs Hemangioma vs other   Check Margins: No  History of actinic keratosis face  Observe  Return in about 2 years (around 09/21/2025) for TBSE, hx of Aks .  IAngelique Holm, CMA, am acting as scribe for Armida Sans, MD .    Documentation: I have reviewed the above documentation for accuracy and completeness, and I agree with the above.  Armida Sans, MD

## 2023-09-26 LAB — SURGICAL PATHOLOGY

## 2023-09-27 ENCOUNTER — Telehealth: Payer: Self-pay

## 2023-09-27 ENCOUNTER — Encounter: Payer: Self-pay | Admitting: Dermatology

## 2023-09-27 NOTE — Telephone Encounter (Addendum)
Called and discussed bx results with patient. Patient verbalized understanding and denied further questions.   ----- Message from Brian Craig sent at 09/27/2023  9:18 AM EDT ----- FINAL DIAGNOSIS        1. Skin, left sacral superior buttock :       HEMANGIOMA   Benign hemangioma = collection of blood vessels.

## 2023-10-12 DIAGNOSIS — F411 Generalized anxiety disorder: Secondary | ICD-10-CM | POA: Diagnosis not present

## 2023-10-12 DIAGNOSIS — F331 Major depressive disorder, recurrent, moderate: Secondary | ICD-10-CM | POA: Diagnosis not present

## 2023-10-12 DIAGNOSIS — F50812 Binge eating disorder, severe: Secondary | ICD-10-CM | POA: Diagnosis not present

## 2023-10-18 ENCOUNTER — Other Ambulatory Visit: Payer: Self-pay | Admitting: Family Medicine

## 2023-10-18 DIAGNOSIS — R7303 Prediabetes: Secondary | ICD-10-CM

## 2023-10-18 DIAGNOSIS — E785 Hyperlipidemia, unspecified: Secondary | ICD-10-CM

## 2023-10-18 DIAGNOSIS — E890 Postprocedural hypothyroidism: Secondary | ICD-10-CM

## 2023-10-18 DIAGNOSIS — I10 Essential (primary) hypertension: Secondary | ICD-10-CM

## 2023-10-18 DIAGNOSIS — E559 Vitamin D deficiency, unspecified: Secondary | ICD-10-CM

## 2023-10-31 ENCOUNTER — Other Ambulatory Visit: Payer: Medicare Other

## 2023-10-31 DIAGNOSIS — E890 Postprocedural hypothyroidism: Secondary | ICD-10-CM | POA: Diagnosis not present

## 2023-10-31 DIAGNOSIS — R7303 Prediabetes: Secondary | ICD-10-CM | POA: Diagnosis not present

## 2023-10-31 DIAGNOSIS — I10 Essential (primary) hypertension: Secondary | ICD-10-CM

## 2023-10-31 DIAGNOSIS — E785 Hyperlipidemia, unspecified: Secondary | ICD-10-CM | POA: Diagnosis not present

## 2023-11-01 LAB — CBC WITH DIFFERENTIAL/PLATELET
Basophils Absolute: 0.1 10*3/uL (ref 0.0–0.2)
Basos: 1 %
EOS (ABSOLUTE): 0.2 10*3/uL (ref 0.0–0.4)
Eos: 2 %
Hematocrit: 50.4 % (ref 37.5–51.0)
Hemoglobin: 17 g/dL (ref 13.0–17.7)
Immature Grans (Abs): 0 10*3/uL (ref 0.0–0.1)
Immature Granulocytes: 0 %
Lymphocytes Absolute: 2.7 10*3/uL (ref 0.7–3.1)
Lymphs: 30 %
MCH: 32.7 pg (ref 26.6–33.0)
MCHC: 33.7 g/dL (ref 31.5–35.7)
MCV: 97 fL (ref 79–97)
Monocytes Absolute: 0.9 10*3/uL (ref 0.1–0.9)
Monocytes: 10 %
Neutrophils Absolute: 5.1 10*3/uL (ref 1.4–7.0)
Neutrophils: 57 %
Platelets: 219 10*3/uL (ref 150–450)
RBC: 5.2 x10E6/uL (ref 4.14–5.80)
RDW: 12.2 % (ref 11.6–15.4)
WBC: 9 10*3/uL (ref 3.4–10.8)

## 2023-11-01 LAB — THYROID PANEL WITH TSH
Free Thyroxine Index: 2.9 (ref 1.2–4.9)
T3 Uptake Ratio: 32 % (ref 24–39)
T4, Total: 9 ug/dL (ref 4.5–12.0)
TSH: 2.15 u[IU]/mL (ref 0.450–4.500)

## 2023-11-01 LAB — COMPREHENSIVE METABOLIC PANEL
ALT: 13 [IU]/L (ref 0–44)
AST: 16 [IU]/L (ref 0–40)
Albumin: 4.2 g/dL (ref 3.8–4.8)
Alkaline Phosphatase: 80 [IU]/L (ref 44–121)
BUN/Creatinine Ratio: 15 (ref 10–24)
BUN: 15 mg/dL (ref 8–27)
Bilirubin Total: 0.6 mg/dL (ref 0.0–1.2)
CO2: 24 mmol/L (ref 20–29)
Calcium: 9.3 mg/dL (ref 8.6–10.2)
Chloride: 106 mmol/L (ref 96–106)
Creatinine, Ser: 0.98 mg/dL (ref 0.76–1.27)
Globulin, Total: 2.3 g/dL (ref 1.5–4.5)
Glucose: 97 mg/dL (ref 70–99)
Potassium: 4.4 mmol/L (ref 3.5–5.2)
Sodium: 142 mmol/L (ref 134–144)
Total Protein: 6.5 g/dL (ref 6.0–8.5)
eGFR: 82 mL/min/{1.73_m2} (ref >59)

## 2023-11-01 LAB — LIPID PANEL
Chol/HDL Ratio: 3.1 ratio (ref 0.0–5.0)
Cholesterol, Total: 96 mg/dL — ABNORMAL LOW (ref 100–199)
HDL: 31 mg/dL — ABNORMAL LOW (ref >39)
LDL Chol Calc (NIH): 43 mg/dL (ref 0–99)
Triglycerides: 123 mg/dL (ref 0–149)
VLDL Cholesterol Cal: 22 mg/dL (ref 5–40)

## 2023-11-01 LAB — HEMOGLOBIN A1C
Est. average glucose Bld gHb Est-mCnc: 120 mg/dL
Hgb A1c MFr Bld: 5.8 % — ABNORMAL HIGH (ref 4.8–5.6)

## 2023-11-07 ENCOUNTER — Ambulatory Visit (INDEPENDENT_AMBULATORY_CARE_PROVIDER_SITE_OTHER): Payer: Medicare Other | Admitting: Family Medicine

## 2023-11-07 ENCOUNTER — Encounter: Payer: Self-pay | Admitting: Family Medicine

## 2023-11-07 VITALS — BP 114/76 | HR 88 | Resp 18 | Ht 72.0 in | Wt 339.0 lb

## 2023-11-07 DIAGNOSIS — E7800 Pure hypercholesterolemia, unspecified: Secondary | ICD-10-CM

## 2023-11-07 DIAGNOSIS — F419 Anxiety disorder, unspecified: Secondary | ICD-10-CM

## 2023-11-07 DIAGNOSIS — I10 Essential (primary) hypertension: Secondary | ICD-10-CM

## 2023-11-07 DIAGNOSIS — R7303 Prediabetes: Secondary | ICD-10-CM

## 2023-11-07 MED ORDER — ALPRAZOLAM 0.5 MG PO TABS: 0.5000 mg | ORAL_TABLET | ORAL | 0 refills | Status: DC | PRN

## 2023-11-07 MED ORDER — SERTRALINE HCL 50 MG PO TABS: 50.0000 mg | ORAL_TABLET | Freq: Every day | ORAL | 1 refills | Status: DC

## 2023-11-07 NOTE — Progress Notes (Signed)
Established Patient Office Visit  Subjective   Patient ID: Brian Craig, male    DOB: 05/25/1951  Age: 72 y.o. MRN: [id number]  Chief Complaint  Patient presents with   Prediabetes        Hyperlipidemia   Hypertension    HPI Brian Craig is a 72 y.o. male presenting today for follow up of hypertension, hyperlipidemia, prediabetes. Hypertension: Patient also followed by cardiology.  Pt denies chest pain, SOB, dizziness, edema, syncope, fatigue or heart palpitations. Taking carvedilol, furosemide, lisinopril, Jardiance, reports excellent compliance with treatment. Denies side effects. Hyperlipidemia: tolerating simvastatin well with no myalgias or significant side effects.  The ASCVD Risk score (Arnett DK, et al., 2019) failed to calculate for the following reasons:   The valid total cholesterol range is 130 to 320 mg/dL Prediabetes: denies hypoglycemic events, wounds or sores that are not healing well, increased thirst or urination.  He was able to start on compounded semaglutide through [facility] in [place].  He is on the lowest dose.  Outpatient Medications Prior to Visit  Medication Sig   SEMAGLUTIDE,0.25 OR 0.5MG /DOS, Meta Inject 0.25 mg into the skin once a week.   acetaminophen (TYLENOL) 500 MG tablet Take 1,000 mg by mouth every 6 (six) hours as needed for moderate pain.   Carboxymethylcellulose Sodium (LUBRICANT EYE DROPS OP) Place 1 drop into both eyes at bedtime.   carvedilol (COREG CR) 80 MG 24 hr capsule TAKE 1 CAPSULE DAILY   cholecalciferol (VITAMIN D3) 25 MCG (1000 UNIT) tablet Take 1,000 Units by mouth daily.   furosemide (LASIX) 40 MG tablet TAKE 1 TABLET (40 MG) EVERY OTHER DAY ALTERNATING WITH 2 TABLETS (80 MG) EVERY OTHER DAY   JARDIANCE 10 MG TABS tablet TAKE 1 TABLET DAILY BEFORE BREAKFAST   levothyroxine (SYNTHROID) 125 MCG tablet Take 125 mcg by mouth daily before breakfast. Patient takes 2 tablets every morning before breakfast   lisinopril  (ZESTRIL) 10 MG tablet TAKE 1 TABLET TWICE A DAY   Melatonin 5 MG CAPS Take 5 mg by mouth at bedtime.   Multiple Vitamin (MULTIVITAMIN) tablet Take 1 tablet by mouth daily.   potassium chloride (KLOR-CON) 10 MEQ tablet TAKE 1 TABLET DAILY   PRADAXA 150 MG CAPS capsule TAKE 1 CAPSULE TWICE DAILY   simvastatin (ZOCOR) 40 MG tablet TAKE 1 TABLET DAILY AT 6 P.M.   [DISCONTINUED] ALPRAZolam (XANAX) 0.5 MG tablet TAKE 1 TABLET THREE TIMES A DAY AS NEEDED FOR ANXIETY   [DISCONTINUED] sertraline (ZOLOFT) 50 MG tablet Take 1 tablet (50 mg total) by mouth daily.   No facility-administered medications prior to visit.    ROS Negative unless otherwise noted in HPI   Objective:     BP 114/76 (BP Location: Left Arm, Patient Position: Sitting, Cuff Size: Large)   Pulse 88   Resp 18   Ht 6' (1.829 m)   Wt (!) 339 lb (153.8 kg)   SpO2 96%   BMI 45.98 kg/m   Physical Exam Constitutional:      General: He is not in acute distress.    Appearance: Normal appearance.  HENT:     Head: Normocephalic and atraumatic.  Cardiovascular:     Rate and Rhythm: Normal rate and regular rhythm.     Heart sounds: Normal heart sounds. No murmur heard.    No friction rub. No gallop.  Pulmonary:     Effort: Pulmonary effort is normal. No respiratory distress.     Breath sounds: Normal breath sounds. No wheezing, rhonchi  or rales.  Skin:    General: Skin is warm and dry.  Neurological:     Mental Status: He is alert and oriented to person, place, and time.  Psychiatric:        Mood and Affect: Mood normal.     Assessment & Plan:  Essential hypertension Assessment & Plan: Followed by cardiology. BP goal <130/80. Controlled.  Continue carvedilol 80 mg daily, furosemide 40 mg daily, Jardiance 10 mg daily, lisinopril 10 mg daily.  Will continue to monitor.   Pure hypercholesterolemia Assessment & Plan: Last lipid panel: LDL 43, HDL 31, triglycerides 123.  Continue simvastatin 40 mg daily.  Discussed ways  to incorporate healthy fats into the diet to increase HDL.  Will continue to monitor.  Repeating lipid panel and CMP for medication monitoring at next appointment.   Prediabetes Assessment & Plan: A1c returned to 5.8.  Continue monitoring sugar/carbohydrate intake and weight management efforts.  Semaglutide injections through [facility] will also help to balance blood sugars.  Will continue to monitor.   Anxiety Assessment & Plan: Stable.  Continue Zoloft 50 mg daily, refilled today.  PDMP reviewed, no aberrancies.  Overdose risk score 090.  Refill of alprazolam 0.5 mg #30 to take as needed up to 3 times a day for anxiety sent today as well.  Orders: -     ALPRAZolam; Take 1 tablet (0.5 mg total) by mouth as needed for anxiety.  Dispense: 30 tablet; Refill: 0 -     Sertraline HCl; Take 1 tablet (50 mg total) by mouth daily.  Dispense: 90 tablet; Refill: 1  Discussed recommendations for Tdap and shingles series, patient aware that he is required to have these administered at the pharmacy.  Most recent colonoscopy in [place] at [facility], patient declines further colorectal cancer screening at this time.  Return in about 6 months (around 05/06/2024) for follow-up for HTN, HLD, preDM, fasting blood work 1 week before.    Melida Quitter, PA

## 2023-11-07 NOTE — Assessment & Plan Note (Addendum)
A1c returned to 5.8.  Continue monitoring sugar/carbohydrate intake and weight management efforts.  Semaglutide injections through [facility] will also help to balance blood sugars.  Will continue to monitor.

## 2023-11-07 NOTE — Patient Instructions (Addendum)
Your HDL "good cholesterol" is low.  Ways that you can increase this number include adding more healthy fats to what you are eating: Choose healthy fats. These include olive oil and canola oil, flaxseeds, walnuts, almonds, and seeds. Eat more omega-3 fats. These include salmon, mackerel, sardines, tuna, flaxseed oil, and ground flaxseeds. Try to eat fish at least 2 times each week.  PREVENTATIVE CARE: -Tetanus booster (one every 10 years) -Shingles series (2 doses 2-6 months apart) You can have these done at the pharmacy.

## 2023-11-07 NOTE — Assessment & Plan Note (Addendum)
Last lipid panel: LDL 43, HDL 31, triglycerides 123.  Continue simvastatin 40 mg daily.  Discussed ways to incorporate healthy fats into the diet to increase HDL.  Will continue to monitor.  Repeating lipid panel and CMP for medication monitoring at next appointment.

## 2023-11-07 NOTE — Assessment & Plan Note (Signed)
Stable.  Continue Zoloft 50 mg daily, refilled today.  PDMP reviewed, no aberrancies.  Overdose risk score 090.  Refill of alprazolam 0.5 mg #30 to take as needed up to 3 times a day for anxiety sent today as well.

## 2023-11-07 NOTE — Assessment & Plan Note (Signed)
Followed by cardiology. BP goal <130/80. Controlled.  Continue carvedilol 80 mg daily, furosemide 40 mg daily, Jardiance 10 mg daily, lisinopril 10 mg daily.  Will continue to monitor.

## 2023-11-09 ENCOUNTER — Ambulatory Visit: Payer: Medicare Other

## 2023-11-09 DIAGNOSIS — I442 Atrioventricular block, complete: Secondary | ICD-10-CM

## 2023-11-09 LAB — CUP PACEART REMOTE DEVICE CHECK
Battery Remaining Longevity: 69 mo
Battery Voltage: 3 V
Brady Statistic AP VP Percent: 0 %
Brady Statistic AP VS Percent: 0 %
Brady Statistic AS VP Percent: 0 %
Brady Statistic AS VS Percent: 0 %
Brady Statistic RA Percent Paced: 0 %
Brady Statistic RV Percent Paced: 99.28 %
Date Time Interrogation Session: 20241127033422
HighPow Impedance: 71 Ohm
HighPow Impedance: 83 Ohm
Implantable Lead Connection Status: 753985
Implantable Lead Connection Status: 753985
Implantable Lead Connection Status: 753985
Implantable Lead Implant Date: 20071217
Implantable Lead Implant Date: 20071217
Implantable Lead Implant Date: 20071217
Implantable Lead Location: 753858
Implantable Lead Location: 753859
Implantable Lead Location: 753860
Implantable Lead Model: 4194
Implantable Lead Model: 5076
Implantable Lead Model: 6947
Implantable Pulse Generator Implant Date: 20230830
Lead Channel Impedance Value: 1178 Ohm
Lead Channel Impedance Value: 361 Ohm
Lead Channel Impedance Value: 399 Ohm
Lead Channel Impedance Value: 646 Ohm
Lead Channel Impedance Value: 874 Ohm
Lead Channel Impedance Value: 893 Ohm
Lead Channel Pacing Threshold Amplitude: 0.75 V
Lead Channel Pacing Threshold Amplitude: 1.125 V
Lead Channel Pacing Threshold Pulse Width: 0.4 ms
Lead Channel Pacing Threshold Pulse Width: 0.8 ms
Lead Channel Setting Pacing Amplitude: 2.5 V
Lead Channel Setting Pacing Amplitude: 2.5 V
Lead Channel Setting Pacing Pulse Width: 0.4 ms
Lead Channel Setting Pacing Pulse Width: 0.8 ms
Lead Channel Setting Sensing Sensitivity: 0.3 mV

## 2023-11-14 ENCOUNTER — Other Ambulatory Visit: Payer: Self-pay | Admitting: Cardiovascular Disease

## 2023-11-14 ENCOUNTER — Encounter: Payer: Self-pay | Admitting: Cardiovascular Disease

## 2023-11-16 DIAGNOSIS — F50812 Binge eating disorder, severe: Secondary | ICD-10-CM | POA: Diagnosis not present

## 2023-11-16 DIAGNOSIS — F411 Generalized anxiety disorder: Secondary | ICD-10-CM | POA: Diagnosis not present

## 2023-11-16 DIAGNOSIS — F331 Major depressive disorder, recurrent, moderate: Secondary | ICD-10-CM | POA: Diagnosis not present

## 2023-11-17 ENCOUNTER — Encounter: Payer: Self-pay | Admitting: Cardiovascular Disease

## 2023-11-18 ENCOUNTER — Other Ambulatory Visit: Payer: Self-pay | Admitting: Cardiovascular Disease

## 2023-11-18 ENCOUNTER — Other Ambulatory Visit: Payer: Self-pay

## 2023-11-18 DIAGNOSIS — G4733 Obstructive sleep apnea (adult) (pediatric): Secondary | ICD-10-CM

## 2023-11-18 NOTE — Progress Notes (Addendum)
 Order for BiPAP supplies sent to [facility] 11/18/23

## 2023-11-22 NOTE — Telephone Encounter (Signed)
Refill sent as requested. 

## 2023-12-01 ENCOUNTER — Ambulatory Visit: Payer: Medicare Other

## 2023-12-01 DIAGNOSIS — Z0000 Encounter for general adult medical examination without abnormal findings: Secondary | ICD-10-CM

## 2023-12-01 NOTE — Progress Notes (Signed)
Subjective:   Brian Craig is a 72 y.o. male who presents for Medicare Annual/Subsequent preventive examination.  Visit Complete: Virtual I connected with  Brian Craig on 12/01/23 by a audio enabled telemedicine application and verified that I am speaking with the correct person using two identifiers.  Patient Location: Home  Provider Location: Office/Clinic  I discussed the limitations of evaluation and management by telemedicine. The patient expressed understanding and agreed to proceed.  Vital Signs: Because this visit was a virtual/telehealth visit, some criteria may be missing or patient reported. Any vitals not documented were not able to be obtained and vitals that have been documented are patient reported.  Patient Medicare AWV questionnaire was completed by the patient on 11/30/2023; I have confirmed that all information answered by patient is correct and no changes since this date.  Cardiac Risk Factors include: advanced age (>7men, >49 women);dyslipidemia;hypertension;male gender     Objective:    Today's Vitals   There is no height or weight on file to calculate BMI.     12/01/2023    9:31 AM 08/11/2022    8:25 AM 07/19/2017    9:45 PM 05/12/2017    8:21 PM 02/21/2017   12:08 PM 05/06/2015   12:33 PM  Advanced Directives  Does Patient Have a Medical Advance Directive? No Yes No No No No  Type of Advance Directive  Healthcare Power of Attorney      Does patient want to make changes to medical advance directive?  No - Patient declined      Would patient like information on creating a medical advance directive?   Yes (Inpatient - patient requests chaplain consult to create a medical advance directive);No - Patient declined Yes (MAU/Ambulatory/Procedural Areas - Information given) Yes (MAU/Ambulatory/Procedural Areas - Information given) No - patient declined information    Current Medications (verified) Outpatient Encounter Medications as of 12/01/2023  Medication  Sig   acetaminophen (TYLENOL) 500 MG tablet Take 1,000 mg by mouth every 6 (six) hours as needed for moderate pain.   ALPRAZolam (XANAX) 0.5 MG tablet Take 1 tablet (0.5 mg total) by mouth as needed for anxiety.   Carboxymethylcellulose Sodium (LUBRICANT EYE DROPS OP) Place 1 drop into both eyes at bedtime.   carvedilol (COREG CR) 80 MG 24 hr capsule TAKE 1 CAPSULE DAILY   cholecalciferol (VITAMIN D3) 25 MCG (1000 UNIT) tablet Take 1,000 Units by mouth daily.   furosemide (LASIX) 40 MG tablet TAKE 1 TABLET (40 MG) EVERY OTHER DAY ALTERNATING WITH 2 TABLETS (80 MG) EVERY OTHER DAY   JARDIANCE 10 MG TABS tablet TAKE 1 TABLET DAILY BEFORE BREAKFAST   levothyroxine (SYNTHROID) 125 MCG tablet Take 125 mcg by mouth daily before breakfast. Patient takes 2 tablets every morning before breakfast   lisinopril (ZESTRIL) 10 MG tablet TAKE 1 TABLET TWICE A DAY   Melatonin 5 MG CAPS Take 5 mg by mouth at bedtime.   Multiple Vitamin (MULTIVITAMIN) tablet Take 1 tablet by mouth daily.   potassium chloride (KLOR-CON) 10 MEQ tablet TAKE 1 TABLET DAILY   PRADAXA 150 MG CAPS capsule TAKE 1 CAPSULE TWICE DAILY   SEMAGLUTIDE,0.25 OR 0.5MG /DOS, [place] Inject 0.25 mg into the skin once a week.   sertraline (ZOLOFT) 50 MG tablet Take 1 tablet (50 mg total) by mouth daily.   simvastatin (ZOCOR) 40 MG tablet TAKE 1 TABLET DAILY AT 6 P.M.   No facility-administered encounter medications on file as of 12/01/2023.    Allergies (verified) Patient has no known  allergies.   History: Past Medical History:  Diagnosis Date   Atrial fibrillation (HCC)    CHF (congestive heart failure) (HCC)    Graves disease    Graves   Hyperlipidemia    Hypertension    OSA treated with BiPAP    Substance abuse (HCC)    alcohol   Past Surgical History:  Procedure Laterality Date   APPENDECTOMY     BIV ICD GENERATOR CHANGEOUT N/A 08/11/2022   Procedure: BIV ICD GENERATOR CHANGEOUT;  Surgeon: Duke Salvia, MD;  Location: [facility] INVASIVE  CV LAB;  Service: Cardiovascular;  Laterality: N/A;   BIV ICD GENERTAOR CHANGE OUT Left 2012   Medtronic Protecta   CARDIAC ELECTROPHYSIOLOGY STUDY AND ABLATION     EP IMPLANTABLE DEVICE N/A 05/06/2015   Procedure: ICD Generator Changeout;  Surgeon: Thurmon Fair, MD;  Location: [facility] INVASIVE CV LAB;  Service: Cardiovascular;  Laterality: N/A;   HERNIA REPAIR     MENISCUS REPAIR Right    VASECTOMY     unilateral due to injury   Family History  Problem Relation Age of Onset   Heart disease Mother    Hypertension Mother    Heart disease Father    Hyperlipidemia Father    Hypertension Father    Heart attack Father    Heart disease Paternal Aunt    Heart disease Paternal Uncle    Alcohol abuse Maternal Uncle    Alcohol abuse Maternal Grandfather    Social History   Socioeconomic History   Marital status: Married    Spouse name: Not on file   Number of children: Not on file   Years of education: Not on file   Highest education level: Bachelor's degree (e.g., BA, AB, BS)  Occupational History   Not on file  Tobacco Use   Smoking status: Every Day    Current packs/day: 0.50    Average packs/day: 0.5 packs/day for 51.0 years (25.5 ttl pk-yrs)    Types: Cigarettes    Passive exposure: Never   Smokeless tobacco: Never   Tobacco comments:    Half of pack daily    08/16/2024 Patient smoke 1/2 pack daily  Vaping Use   Vaping status: Never Used  Substance and Sexual Activity   Alcohol use: No    Comment: recovering alcoholic   Drug use: No   Sexual activity: Yes    Birth control/protection: None  Other Topics Concern   Not on file  Social History Narrative   Not on file   Social Drivers of Health   Financial Resource Strain: Low Risk  (12/01/2023)   Overall Financial Resource Strain (CARDIA)    Difficulty of Paying Living Expenses: Not hard at all  Food Insecurity: No Food Insecurity (12/01/2023)   Hunger Vital Sign    Worried About Running Out of Food in the Last  Year: Never true    Ran Out of Food in the Last Year: Never true  Transportation Needs: No Transportation Needs (12/01/2023)   PRAPARE - Administrator, Civil Service (Medical): No    Lack of Transportation (Non-Medical): No  Physical Activity: Inactive (12/01/2023)   Exercise Vital Sign    Days of Exercise per Week: 0 days    Minutes of Exercise per Session: 0 min  Stress: No Stress Concern Present (12/01/2023)   [employer] of Occupational Health - Occupational Stress Questionnaire    Feeling of Stress : Not at all  Social Connections: Moderately Integrated (12/01/2023)   Social Connection  and Isolation Panel [NHANES]    Frequency of Communication with Friends and Family: More than three times a week    Frequency of Social Gatherings with Friends and Family: Three times a week    Attends Religious Services: Never    Active Member of Clubs or Organizations: Yes    Attends Engineer, structural: More than 4 times per year    Marital Status: Married    Tobacco Counseling Ready to quit: Not Answered Counseling given: Not Answered Tobacco comments: Half of pack daily 08/16/2024 Patient smoke 1/2 pack daily   Clinical Intake:  Pre-visit preparation completed: Yes  Pain : No/denies pain     Nutritional Risks: Nausea/ vomitting/ diarrhea Diabetes: No  How often do you need to have someone help you when you read instructions, pamphlets, or other written materials from your doctor or pharmacy?: 1 - Never  Interpreter Needed?: No  Information entered by :: NAllen LPN   Activities of Daily Living    11/30/2023   10:06 AM  In your present state of health, do you have any difficulty performing the following activities:  Hearing? 0  Vision? 0  Difficulty concentrating or making decisions? 0  Walking or climbing stairs? 0  Dressing or bathing? 0  Doing errands, shopping? 0  Preparing Food and eating ? N  Using the Toilet? N  In the past six  months, have you accidently leaked urine? N  Do you have problems with loss of bowel control? N  Managing your Medications? N  Managing your Finances? N  Housekeeping or managing your Housekeeping? N    Patient Care Team: Melida Quitter, PA as PCP - General (Family Medicine) Croitoru, Rachelle Hora, MD as PCP - Cardiology (Cardiology) Willeen Niece, MD (Dermatology) Sallye Lat, MD as Consulting Physician (Ophthalmology) Izell Robin [place], MD as Referring Physician (Endocrinology) Croitoru, Rachelle Hora, MD as Consulting Physician (Cardiology)  Indicate any recent Medical Services you may have received from other than [facility] providers in the past year (date may be approximate).     Assessment:   This is a routine wellness examination for Haneef.  Hearing/Vision screen Hearing Screening - Comments:: Denies hearing issues Vision Screening - Comments:: Regular eye exams, [facility]   Goals Addressed             This Visit's Progress    Patient Stated       12/01/2023, wants to lose weight       Depression Screen    12/01/2023    9:32 AM 04/05/2023    3:38 PM 11/22/2022   10:25 AM 09/20/2022   10:34 AM 03/15/2022    9:41 AM 11/11/2021    9:01 AM 05/18/2021    1:40 PM  PHQ 2/9 Scores  PHQ - 2 Score 0 0 0 0 0 0 0  PHQ- 9 Score  0  2 1 1  0    Fall Risk    11/30/2023   10:06 AM 04/05/2023    3:39 PM 09/20/2022   10:33 AM 03/15/2022    9:41 AM 11/11/2021    9:01 AM  Fall Risk   Falls in the past year? 0 0 0 0 0  Number falls in past yr: 0 0 0 0 0  Injury with Fall? 0 0 0 0 0  Risk for fall due to : Medication side effect History of fall(s) No Fall Risks No Fall Risks   Follow up Falls prevention discussed;Falls evaluation completed Falls evaluation completed Falls evaluation completed Falls  evaluation completed Falls evaluation completed    MEDICARE RISK AT HOME: Medicare Risk at Home Any stairs in or around the home?: (Patient-Rptd) Yes If so, are there any without  handrails?: (Patient-Rptd) No Home free of loose throw rugs in walkways, pet beds, electrical cords, etc?: (Patient-Rptd) Yes Adequate lighting in your home to reduce risk of falls?: (Patient-Rptd) Yes Life alert?: (Patient-Rptd) No Use of a cane, walker or w/c?: (Patient-Rptd) No Grab bars in the bathroom?: (Patient-Rptd) Yes Shower chair or bench in shower?: (Patient-Rptd) Yes Elevated toilet seat or a handicapped toilet?: (Patient-Rptd) Yes  TIMED UP AND GO:  Was the test performed?  No    Cognitive Function:        12/01/2023    9:32 AM 11/22/2022   10:26 AM 05/18/2021   10:03 AM 04/14/2020    8:22 AM 04/09/2019    1:12 PM  6CIT Screen  What Year? 0 points 0 points 0 points 0 points 0 points  What month? 0 points 0 points 0 points 0 points 0 points  What time? 0 points 0 points 0 points 0 points 0 points  Count back from 20 0 points 0 points 0 points 0 points 0 points  Months in reverse 0 points 0 points 2 points 0 points 0 points  Repeat phrase 0 points 0 points 0 points 2 points 2 points  Total Score 0 points 0 points 2 points 2 points 2 points    Immunizations Immunization History  Administered Date(s) Administered   PFIZER(Purple Top)SARS-COV-2 Vaccination 01/27/2020, 02/19/2020   Pneumococcal Conjugate-13 02/22/2018   Pneumococcal Polysaccharide-23 04/24/2019    TDAP status: Due, Education has been provided regarding the importance of this vaccine. Advised may receive this vaccine at local pharmacy or Health Dept. Aware to provide a copy of the vaccination record if obtained from local pharmacy or Health Dept. Verbalized acceptance and understanding.  Flu Vaccine status: Declined, Education has been provided regarding the importance of this vaccine but patient still declined. Advised may receive this vaccine at local pharmacy or Health Dept. Aware to provide a copy of the vaccination record if obtained from local pharmacy or Health Dept. Verbalized acceptance and  understanding.  Pneumococcal vaccine status: Up to date  Covid-19 vaccine status: Information provided on how to obtain vaccines.   Qualifies for Shingles Vaccine? Yes   Zostavax completed No   Shingrix Completed?: No.    Education has been provided regarding the importance of this vaccine. Patient has been advised to call insurance company to determine out of pocket expense if they have not yet received this vaccine. Advised may also receive vaccine at local pharmacy or Health Dept. Verbalized acceptance and understanding.  Screening Tests Health Maintenance  Topic Date Due   DTaP/Tdap/Td (1 - Tdap) Never done   Zoster Vaccines- Shingrix (1 of 2) Never done   COVID-19 Vaccine (3 - Pfizer risk series) 12/17/2023 (Originally 03/18/2020)   INFLUENZA VACCINE  03/12/2024 (Originally 07/14/2023)   Colonoscopy  11/30/2024 (Originally 10/22/1996)   Lung Cancer Screening  01/04/2024   Medicare Annual Wellness (AWV)  11/30/2024   Pneumonia Vaccine 26+ Years old  Completed   Hepatitis C Screening  Completed   HPV VACCINES  Aged Out    Health Maintenance  Health Maintenance Due  Topic Date Due   DTaP/Tdap/Td (1 - Tdap) Never done   Zoster Vaccines- Shingrix (1 of 2) Never done    Colorectal cancer screening: declines  Lung Cancer Screening: (Low Dose CT Chest recommended if  Age 31-80 years, 20 pack-year currently smoking OR have quit w/in 15years.) does qualify.   Lung Cancer Screening Referral: CT scan 01/03/2023  Additional Screening:  Hepatitis C Screening: does qualify; Completed 03/17/2020  Vision Screening: Recommended annual ophthalmology exams for early detection of glaucoma and other disorders of the eye. Is the patient up to date with their annual eye exam?  Yes  Who is the provider or what is the name of the office in which the patient attends annual eye exams? [facility] If pt is not established with a provider, would they like to be referred to a provider to establish  care? No .   Dental Screening: Recommended annual dental exams for proper oral hygiene  Diabetic Foot Exam: n/a  Community Resource Referral / Chronic Care Management: CRR required this visit?  No   CCM required this visit?  No     Plan:     I have personally reviewed and noted the following in the patient's chart:   Medical and social history Use of alcohol, tobacco or illicit drugs  Current medications and supplements including opioid prescriptions. Patient is not currently taking opioid prescriptions. Functional ability and status Nutritional status Physical activity Advanced directives List of other physicians Hospitalizations, surgeries, and ER visits in previous 12 months Vitals Screenings to include cognitive, depression, and falls Referrals and appointments  In addition, I have reviewed and discussed with patient certain preventive protocols, quality metrics, and best practice recommendations. A written personalized care plan for preventive services as well as general preventive health recommendations were provided to patient.     Barb Merino, LPN   41/32/4401   After Visit Summary: (MyChart) Due to this being a telephonic visit, the after visit summary with patients personalized plan was offered to patient via MyChart   Nurse Notes: none

## 2023-12-01 NOTE — Patient Instructions (Signed)
Brian Craig , Thank you for taking time to come for your Medicare Wellness Visit. I appreciate your ongoing commitment to your health goals. Please review the following plan we discussed and let me know if I can assist you in the future.   Referrals/Orders/Follow-Ups/Clinician Recommendations: none  This is a list of the screening recommended for you and due dates:  Health Maintenance  Topic Date Due   DTaP/Tdap/Td vaccine (1 - Tdap) Never done   Zoster (Shingles) Vaccine (1 of 2) Never done   COVID-19 Vaccine (3 - Pfizer risk series) 12/17/2023*   Flu Shot  03/12/2024*   Colon Cancer Screening  11/30/2024*   Screening for Lung Cancer  01/04/2024   Medicare Annual Wellness Visit  11/30/2024   Pneumonia Vaccine  Completed   Hepatitis C Screening  Completed   HPV Vaccine  Aged Out  *Topic was postponed. The date shown is not the original due date.    Advanced directives: (ACP Link)Information on Advanced Care Planning can be found at [facility] Advance Health Care Directives Advance Health Care Directives ([email or web address]   Next Medicare Annual Wellness Visit scheduled for next year: Yes  insert Preventive Care attachment Insert FALL PREVENTION attachment if needed

## 2023-12-23 ENCOUNTER — Other Ambulatory Visit: Payer: Self-pay | Admitting: Cardiovascular Disease

## 2024-01-11 DIAGNOSIS — E039 Hypothyroidism, unspecified: Secondary | ICD-10-CM | POA: Diagnosis not present

## 2024-01-17 DIAGNOSIS — F331 Major depressive disorder, recurrent, moderate: Secondary | ICD-10-CM | POA: Diagnosis not present

## 2024-01-17 DIAGNOSIS — F50812 Binge eating disorder, severe: Secondary | ICD-10-CM | POA: Diagnosis not present

## 2024-01-17 DIAGNOSIS — K219 Gastro-esophageal reflux disease without esophagitis: Secondary | ICD-10-CM | POA: Diagnosis not present

## 2024-01-17 DIAGNOSIS — F411 Generalized anxiety disorder: Secondary | ICD-10-CM | POA: Diagnosis not present

## 2024-01-19 ENCOUNTER — Encounter: Payer: Self-pay | Admitting: Family Medicine

## 2024-01-19 ENCOUNTER — Other Ambulatory Visit: Payer: Self-pay | Admitting: Cardiovascular Disease

## 2024-01-20 ENCOUNTER — Telehealth: Payer: Self-pay | Admitting: Cardiovascular Disease

## 2024-01-20 DIAGNOSIS — I442 Atrioventricular block, complete: Secondary | ICD-10-CM

## 2024-01-20 DIAGNOSIS — D6869 Other thrombophilia: Secondary | ICD-10-CM

## 2024-01-20 DIAGNOSIS — E785 Hyperlipidemia, unspecified: Secondary | ICD-10-CM

## 2024-01-20 DIAGNOSIS — I5042 Chronic combined systolic (congestive) and diastolic (congestive) heart failure: Secondary | ICD-10-CM

## 2024-01-20 DIAGNOSIS — G4733 Obstructive sleep apnea (adult) (pediatric): Secondary | ICD-10-CM

## 2024-01-20 DIAGNOSIS — I4821 Permanent atrial fibrillation: Secondary | ICD-10-CM

## 2024-01-20 DIAGNOSIS — Z95810 Presence of automatic (implantable) cardiac defibrillator: Secondary | ICD-10-CM

## 2024-01-20 NOTE — Telephone Encounter (Signed)
  Pt is calling to follow cpap supplies, he said, [facility] did not receive the order yet and he gave fax [phone]

## 2024-01-31 ENCOUNTER — Ambulatory Visit (INDEPENDENT_AMBULATORY_CARE_PROVIDER_SITE_OTHER): Payer: Medicare Other | Admitting: Family Medicine

## 2024-01-31 ENCOUNTER — Encounter: Payer: Self-pay | Admitting: Family Medicine

## 2024-01-31 VITALS — BP 130/76 | HR 82 | Ht 72.0 in | Wt 310.5 lb

## 2024-01-31 DIAGNOSIS — H9202 Otalgia, left ear: Secondary | ICD-10-CM

## 2024-01-31 DIAGNOSIS — H65192 Other acute nonsuppurative otitis media, left ear: Secondary | ICD-10-CM

## 2024-01-31 MED ORDER — AMOXICILLIN-POT CLAVULANATE 875-125 MG PO TABS: 1.0000 | ORAL_TABLET | Freq: Two times a day (BID) | ORAL | 0 refills | Status: DC

## 2024-01-31 NOTE — Progress Notes (Signed)
   Acute Office Visit  Subjective:     Patient ID: Brian Craig, male    DOB: 1951/08/30, 73 y.o.   MRN: [id number]  Chief Complaint  Patient presents with   Ear Pain    HPI Patient is in today for ear pain.  He reports a stabbing pain behind his left ear every time he drinks a hot or cold beverage for the past 2-3 days.  The stabbing pain also occurs other times during the day and does not seem to have any clear triggers as it occurs when eating, resting, or doing other activities throughout the day.  Denies fever, chills, rhinorrhea, eye irritation, sore throat, cough, other URI symptoms.  ROS See HPI    Objective:    BP 130/76   Pulse 82   Ht 6' (1.829 m)   Wt (!) 310 lb 8 oz (140.8 kg)   SpO2 97%   BMI 42.11 kg/m   Physical Exam Constitutional:      General: He is not in acute distress.    Appearance: Normal appearance. He is not ill-appearing.  HENT:     Head: Normocephalic and atraumatic.     Right Ear: Tympanic membrane, ear canal and external ear normal.     Left Ear: Ear canal normal. There is mastoid tenderness. Tympanic membrane is injected.     Nose: Nose normal. No congestion or rhinorrhea.     Mouth/Throat:     Mouth: Mucous membranes are moist.     Pharynx: Oropharynx is clear. No oropharyngeal exudate or posterior oropharyngeal erythema.  Eyes:     General:        Right eye: No discharge.        Left eye: No discharge.     Conjunctiva/sclera: Conjunctivae normal.     Pupils: Pupils are equal, round, and reactive to light.  Neck:   Cardiovascular:     Rate and Rhythm: Normal rate and regular rhythm.     Heart sounds: Normal heart sounds. No murmur heard.    No friction rub. No gallop.  Pulmonary:     Effort: Pulmonary effort is normal. No respiratory distress.     Breath sounds: Normal breath sounds. No wheezing, rhonchi or rales.  Lymphadenopathy:     Cervical: Cervical adenopathy present.  Skin:    General: Skin is warm and dry.   Neurological:     Mental Status: He is alert and oriented to person, place, and time.  Psychiatric:        Mood and Affect: Mood normal.       Assessment & Plan:  Left ear pain -     Amoxicillin-Pot Clavulanate; Take 1 tablet by mouth 2 (two) times daily.  Dispense: 14 tablet; Refill: 0  Other non-recurrent acute nonsuppurative otitis media of left ear -     Amoxicillin-Pot Clavulanate; Take 1 tablet by mouth 2 (two) times daily.  Dispense: 14 tablet; Refill: 0  Treat with 7-day course of Augmentin for acute otitis media.  Use Tylenol or ibuprofen for pain management.  Contact PCP if no improvement after first 3 doses.  Return if symptoms worsen or fail to improve.  Melida Quitter, PA

## 2024-01-31 NOTE — Patient Instructions (Signed)
Finish the full bottle of antibiotics.  You can use Tylenol or ibuprofen for pain relief.  Send me a MyChart message by Friday morning if you have not noticed any improvement.

## 2024-02-02 NOTE — Telephone Encounter (Signed)
Order sent to [facility] today 02/02/24 for PAP supplies

## 2024-02-06 ENCOUNTER — Other Ambulatory Visit: Payer: Self-pay | Admitting: Family Medicine

## 2024-02-06 DIAGNOSIS — F419 Anxiety disorder, unspecified: Secondary | ICD-10-CM

## 2024-02-08 ENCOUNTER — Ambulatory Visit (INDEPENDENT_AMBULATORY_CARE_PROVIDER_SITE_OTHER): Payer: Medicare Other

## 2024-02-08 DIAGNOSIS — I442 Atrioventricular block, complete: Secondary | ICD-10-CM

## 2024-02-09 LAB — CUP PACEART REMOTE DEVICE CHECK
Battery Remaining Longevity: 68 mo
Battery Voltage: 2.99 V
Brady Statistic AP VP Percent: 0 %
Brady Statistic AP VS Percent: 0 %
Brady Statistic AS VP Percent: 0 %
Brady Statistic AS VS Percent: 0 %
Brady Statistic RA Percent Paced: 0 %
Brady Statistic RV Percent Paced: 99.19 %
Date Time Interrogation Session: 20250226091605
HighPow Impedance: 96 Ohm
HighPow Impedance: 98 Ohm
Implantable Lead Connection Status: 753985
Implantable Lead Connection Status: 753985
Implantable Lead Connection Status: 753985
Implantable Lead Implant Date: 20071217
Implantable Lead Implant Date: 20071217
Implantable Lead Implant Date: 20071217
Implantable Lead Location: 753858
Implantable Lead Location: 753859
Implantable Lead Location: 753860
Implantable Lead Model: 4194
Implantable Lead Model: 5076
Implantable Lead Model: 6947
Implantable Pulse Generator Implant Date: 20230830
Lead Channel Impedance Value: 304 Ohm
Lead Channel Impedance Value: 361 Ohm
Lead Channel Impedance Value: 399 Ohm
Lead Channel Impedance Value: 646 Ohm
Lead Channel Impedance Value: 722 Ohm
Lead Channel Impedance Value: 931 Ohm
Lead Channel Pacing Threshold Amplitude: 0.75 V
Lead Channel Pacing Threshold Amplitude: 1.25 V
Lead Channel Pacing Threshold Pulse Width: 0.4 ms
Lead Channel Pacing Threshold Pulse Width: 0.8 ms
Lead Channel Setting Pacing Amplitude: 2.5 V
Lead Channel Setting Pacing Amplitude: 2.5 V
Lead Channel Setting Pacing Pulse Width: 0.4 ms
Lead Channel Setting Pacing Pulse Width: 0.8 ms
Lead Channel Setting Sensing Sensitivity: 0.3 mV

## 2024-02-13 NOTE — Telephone Encounter (Signed)
 Pt would like a c/b regarding whether he is able to get new CPAP machine. Please advise

## 2024-02-19 ENCOUNTER — Encounter: Payer: Self-pay | Admitting: Cardiovascular Disease

## 2024-02-28 DIAGNOSIS — K219 Gastro-esophageal reflux disease without esophagitis: Secondary | ICD-10-CM | POA: Diagnosis not present

## 2024-02-28 DIAGNOSIS — E119 Type 2 diabetes mellitus without complications: Secondary | ICD-10-CM | POA: Diagnosis not present

## 2024-02-28 DIAGNOSIS — F331 Major depressive disorder, recurrent, moderate: Secondary | ICD-10-CM | POA: Diagnosis not present

## 2024-02-28 DIAGNOSIS — F411 Generalized anxiety disorder: Secondary | ICD-10-CM | POA: Diagnosis not present

## 2024-02-28 DIAGNOSIS — G4733 Obstructive sleep apnea (adult) (pediatric): Secondary | ICD-10-CM | POA: Diagnosis not present

## 2024-02-28 DIAGNOSIS — E88819 Insulin resistance, unspecified: Secondary | ICD-10-CM | POA: Diagnosis not present

## 2024-02-28 NOTE — Addendum Note (Signed)
 Addended by: Brunetta Genera on: 02/28/2024 01:15 PM   Modules accepted: Orders

## 2024-02-28 NOTE — Telephone Encounter (Signed)
 Notified patient that order has been sent to [facility] today for new Air [employer] 11 and supplies.

## 2024-03-13 NOTE — Progress Notes (Signed)
 Remote ICD transmission.

## 2024-03-13 NOTE — Addendum Note (Signed)
 Addended by: Elease Etienne A on: 03/13/2024 04:08 PM   Modules accepted: Orders

## 2024-03-21 ENCOUNTER — Telehealth: Payer: Self-pay | Admitting: Cardiovascular Disease

## 2024-03-21 DIAGNOSIS — I4821 Permanent atrial fibrillation: Secondary | ICD-10-CM

## 2024-03-21 DIAGNOSIS — Z95810 Presence of automatic (implantable) cardiac defibrillator: Secondary | ICD-10-CM

## 2024-03-21 DIAGNOSIS — I442 Atrioventricular block, complete: Secondary | ICD-10-CM

## 2024-03-21 DIAGNOSIS — Z4502 Encounter for adjustment and management of automatic implantable cardiac defibrillator: Secondary | ICD-10-CM

## 2024-03-21 DIAGNOSIS — E785 Hyperlipidemia, unspecified: Secondary | ICD-10-CM

## 2024-03-21 DIAGNOSIS — I5042 Chronic combined systolic (congestive) and diastolic (congestive) heart failure: Secondary | ICD-10-CM

## 2024-03-21 DIAGNOSIS — D6869 Other thrombophilia: Secondary | ICD-10-CM

## 2024-03-21 DIAGNOSIS — G4733 Obstructive sleep apnea (adult) (pediatric): Secondary | ICD-10-CM

## 2024-03-21 NOTE — Telephone Encounter (Signed)
 Pt called in asking to speak with you. He said [facility] needs a new rx for a new machine. Please advise.

## 2024-04-02 DIAGNOSIS — E0500 Thyrotoxicosis with diffuse goiter without thyrotoxic crisis or storm: Secondary | ICD-10-CM | POA: Diagnosis not present

## 2024-04-02 DIAGNOSIS — H02831 Dermatochalasis of right upper eyelid: Secondary | ICD-10-CM | POA: Diagnosis not present

## 2024-04-02 DIAGNOSIS — H2513 Age-related nuclear cataract, bilateral: Secondary | ICD-10-CM | POA: Diagnosis not present

## 2024-04-02 DIAGNOSIS — H0520 Unspecified exophthalmos: Secondary | ICD-10-CM | POA: Diagnosis not present

## 2024-04-02 DIAGNOSIS — H16213 Exposure keratoconjunctivitis, bilateral: Secondary | ICD-10-CM | POA: Diagnosis not present

## 2024-04-02 DIAGNOSIS — H04123 Dry eye syndrome of bilateral lacrimal glands: Secondary | ICD-10-CM | POA: Diagnosis not present

## 2024-04-02 DIAGNOSIS — H02834 Dermatochalasis of left upper eyelid: Secondary | ICD-10-CM | POA: Diagnosis not present

## 2024-04-05 NOTE — Telephone Encounter (Signed)
 [facility] calling to get update on rx needed for replacement bypap and ov notes within last 70mo or pt needs appt. Fax was sent over on the 18th. Wanting to know if rec'vd. [phone]

## 2024-04-10 ENCOUNTER — Telehealth: Payer: Self-pay | Admitting: Cardiovascular Disease

## 2024-04-10 NOTE — Telephone Encounter (Signed)
 [facility] requesting pts office notes be faxed over. [phone]

## 2024-04-10 NOTE — Telephone Encounter (Signed)
 Spoke with Tanya Fantasia from [facility] and she is aware that patient has not had a recent OSA visit in the last 6 months. He does have an appointment in 06/06/24

## 2024-04-17 ENCOUNTER — Other Ambulatory Visit: Payer: Self-pay | Admitting: Family Medicine

## 2024-04-17 DIAGNOSIS — F331 Major depressive disorder, recurrent, moderate: Secondary | ICD-10-CM | POA: Diagnosis not present

## 2024-04-17 DIAGNOSIS — F411 Generalized anxiety disorder: Secondary | ICD-10-CM | POA: Diagnosis not present

## 2024-04-17 DIAGNOSIS — E88819 Insulin resistance, unspecified: Secondary | ICD-10-CM | POA: Diagnosis not present

## 2024-04-17 DIAGNOSIS — E119 Type 2 diabetes mellitus without complications: Secondary | ICD-10-CM | POA: Diagnosis not present

## 2024-04-17 DIAGNOSIS — F419 Anxiety disorder, unspecified: Secondary | ICD-10-CM

## 2024-04-17 DIAGNOSIS — K219 Gastro-esophageal reflux disease without esophagitis: Secondary | ICD-10-CM | POA: Diagnosis not present

## 2024-04-17 DIAGNOSIS — F50812 Binge eating disorder, severe: Secondary | ICD-10-CM | POA: Diagnosis not present

## 2024-04-17 DIAGNOSIS — G4733 Obstructive sleep apnea (adult) (pediatric): Secondary | ICD-10-CM | POA: Diagnosis not present

## 2024-04-23 ENCOUNTER — Telehealth: Payer: Self-pay | Admitting: *Deleted

## 2024-04-23 ENCOUNTER — Other Ambulatory Visit: Payer: Self-pay | Admitting: Family Medicine

## 2024-04-23 NOTE — Telephone Encounter (Signed)
 Contacted pharmacy and informed them of below.

## 2024-04-23 NOTE — Telephone Encounter (Signed)
 It should be daily PRN and each prescription of 30 tabs should last 2 months.

## 2024-04-23 NOTE — Telephone Encounter (Signed)
 Copied from CRM [id number]. Topic: Clinical - Medication Question >> Apr 23, 2024  1:58 PM [employer] wrote: Reason for CRM: Darshania from [facility] would like to know the quantity for ALPRAZolam  (XANAX ) 0.5 MG tablet and how long 30 tablets will last patient  Callback #: [phone] Fax #: [phone] Reference #: [id number]

## 2024-05-01 ENCOUNTER — Other Ambulatory Visit: Payer: Self-pay | Admitting: *Deleted

## 2024-05-01 DIAGNOSIS — E7800 Pure hypercholesterolemia, unspecified: Secondary | ICD-10-CM

## 2024-05-01 DIAGNOSIS — R7303 Prediabetes: Secondary | ICD-10-CM

## 2024-05-01 DIAGNOSIS — I10 Essential (primary) hypertension: Secondary | ICD-10-CM

## 2024-05-02 ENCOUNTER — Other Ambulatory Visit: Payer: Medicare Other

## 2024-05-02 DIAGNOSIS — R7303 Prediabetes: Secondary | ICD-10-CM | POA: Diagnosis not present

## 2024-05-02 DIAGNOSIS — E7800 Pure hypercholesterolemia, unspecified: Secondary | ICD-10-CM | POA: Diagnosis not present

## 2024-05-02 DIAGNOSIS — I10 Essential (primary) hypertension: Secondary | ICD-10-CM

## 2024-05-03 ENCOUNTER — Ambulatory Visit: Payer: Self-pay | Admitting: Family Medicine

## 2024-05-03 LAB — COMPREHENSIVE METABOLIC PANEL WITH GFR
ALT: 12 IU/L (ref 0–44)
AST: 18 IU/L (ref 0–40)
Albumin: 4.3 g/dL (ref 3.8–4.8)
Alkaline Phosphatase: 100 IU/L (ref 44–121)
BUN/Creatinine Ratio: 10 (ref 10–24)
BUN: 8 mg/dL (ref 8–27)
Bilirubin Total: 0.9 mg/dL (ref 0.0–1.2)
CO2: 20 mmol/L (ref 20–29)
Calcium: 9.3 mg/dL (ref 8.6–10.2)
Chloride: 100 mmol/L (ref 96–106)
Creatinine, Ser: 0.83 mg/dL (ref 0.76–1.27)
Globulin, Total: 2.3 g/dL (ref 1.5–4.5)
Glucose: 97 mg/dL (ref 70–99)
Potassium: 3.8 mmol/L (ref 3.5–5.2)
Sodium: 140 mmol/L (ref 134–144)
Total Protein: 6.6 g/dL (ref 6.0–8.5)
eGFR: 93 mL/min/{1.73_m2} (ref >59)

## 2024-05-03 LAB — CBC WITH DIFFERENTIAL/PLATELET
Basophils Absolute: 0.1 10*3/uL (ref 0.0–0.2)
Basos: 1 %
EOS (ABSOLUTE): 0.1 10*3/uL (ref 0.0–0.4)
Eos: 2 %
Hematocrit: 48.4 % (ref 37.5–51.0)
Hemoglobin: 16.1 g/dL (ref 13.0–17.7)
Immature Grans (Abs): 0 10*3/uL (ref 0.0–0.1)
Immature Granulocytes: 0 %
Lymphocytes Absolute: 2.5 10*3/uL (ref 0.7–3.1)
Lymphs: 31 %
MCH: 32.1 pg (ref 26.6–33.0)
MCHC: 33.3 g/dL (ref 31.5–35.7)
MCV: 96 fL (ref 79–97)
Monocytes Absolute: 0.8 10*3/uL (ref 0.1–0.9)
Monocytes: 10 %
Neutrophils Absolute: 4.5 10*3/uL (ref 1.4–7.0)
Neutrophils: 56 %
Platelets: 231 10*3/uL (ref 150–450)
RBC: 5.02 x10E6/uL (ref 4.14–5.80)
RDW: 12.2 % (ref 11.6–15.4)
WBC: 8 10*3/uL (ref 3.4–10.8)

## 2024-05-03 LAB — HEMOGLOBIN A1C
Est. average glucose Bld gHb Est-mCnc: 105 mg/dL
Hgb A1c MFr Bld: 5.3 % (ref 4.8–5.6)

## 2024-05-03 LAB — LIPID PANEL
Chol/HDL Ratio: 3.4 ratio (ref 0.0–5.0)
Cholesterol, Total: 102 mg/dL (ref 100–199)
HDL: 30 mg/dL — ABNORMAL LOW (ref >39)
LDL Chol Calc (NIH): 49 mg/dL (ref 0–99)
Triglycerides: 129 mg/dL (ref 0–149)
VLDL Cholesterol Cal: 23 mg/dL (ref 5–40)

## 2024-05-07 ENCOUNTER — Other Ambulatory Visit: Payer: Self-pay | Admitting: Family Medicine

## 2024-05-07 DIAGNOSIS — F419 Anxiety disorder, unspecified: Secondary | ICD-10-CM

## 2024-05-09 ENCOUNTER — Ambulatory Visit: Payer: Medicare Other | Admitting: Family Medicine

## 2024-05-09 ENCOUNTER — Ambulatory Visit (INDEPENDENT_AMBULATORY_CARE_PROVIDER_SITE_OTHER): Payer: Medicare Other

## 2024-05-09 DIAGNOSIS — I442 Atrioventricular block, complete: Secondary | ICD-10-CM | POA: Diagnosis not present

## 2024-05-10 LAB — CUP PACEART REMOTE DEVICE CHECK
Battery Remaining Longevity: 64 mo
Battery Voltage: 2.99 V
Brady Statistic AP VP Percent: 0 %
Brady Statistic AP VS Percent: 0 %
Brady Statistic AS VP Percent: 0 %
Brady Statistic AS VS Percent: 0 %
Brady Statistic RA Percent Paced: 0 %
Brady Statistic RV Percent Paced: 99.21 %
Date Time Interrogation Session: 20250528033324
HighPow Impedance: 122 Ohm
HighPow Impedance: 126 Ohm
Implantable Lead Connection Status: 753985
Implantable Lead Connection Status: 753985
Implantable Lead Connection Status: 753985
Implantable Lead Implant Date: 20071217
Implantable Lead Implant Date: 20071217
Implantable Lead Implant Date: 20071217
Implantable Lead Location: 753858
Implantable Lead Location: 753859
Implantable Lead Location: 753860
Implantable Lead Model: 4194
Implantable Lead Model: 5076
Implantable Lead Model: 6947
Implantable Pulse Generator Implant Date: 20230830
Lead Channel Impedance Value: 361 Ohm
Lead Channel Impedance Value: 399 Ohm
Lead Channel Impedance Value: 532 Ohm
Lead Channel Impedance Value: 589 Ohm
Lead Channel Impedance Value: 703 Ohm
Lead Channel Impedance Value: 836 Ohm
Lead Channel Pacing Threshold Amplitude: 0.75 V
Lead Channel Pacing Threshold Amplitude: 1.375 V
Lead Channel Pacing Threshold Pulse Width: 0.4 ms
Lead Channel Pacing Threshold Pulse Width: 0.8 ms
Lead Channel Setting Pacing Amplitude: 2.5 V
Lead Channel Setting Pacing Amplitude: 2.5 V
Lead Channel Setting Pacing Pulse Width: 0.4 ms
Lead Channel Setting Pacing Pulse Width: 0.8 ms
Lead Channel Setting Sensing Sensitivity: 0.3 mV

## 2024-05-16 ENCOUNTER — Ambulatory Visit: Payer: Self-pay | Admitting: Cardiovascular Disease

## 2024-05-29 DIAGNOSIS — G4733 Obstructive sleep apnea (adult) (pediatric): Secondary | ICD-10-CM | POA: Diagnosis not present

## 2024-05-29 DIAGNOSIS — F50812 Binge eating disorder, severe: Secondary | ICD-10-CM | POA: Diagnosis not present

## 2024-05-29 DIAGNOSIS — E119 Type 2 diabetes mellitus without complications: Secondary | ICD-10-CM | POA: Diagnosis not present

## 2024-05-29 DIAGNOSIS — F411 Generalized anxiety disorder: Secondary | ICD-10-CM | POA: Diagnosis not present

## 2024-05-29 DIAGNOSIS — F331 Major depressive disorder, recurrent, moderate: Secondary | ICD-10-CM | POA: Diagnosis not present

## 2024-05-30 ENCOUNTER — Ambulatory Visit (INDEPENDENT_AMBULATORY_CARE_PROVIDER_SITE_OTHER)

## 2024-05-30 VITALS — BP 104/67 | HR 86 | Ht 72.0 in | Wt 292.0 lb

## 2024-05-30 DIAGNOSIS — Z720 Tobacco use: Secondary | ICD-10-CM | POA: Diagnosis not present

## 2024-05-30 DIAGNOSIS — F419 Anxiety disorder, unspecified: Secondary | ICD-10-CM

## 2024-05-30 DIAGNOSIS — E7800 Pure hypercholesterolemia, unspecified: Secondary | ICD-10-CM | POA: Diagnosis not present

## 2024-05-30 DIAGNOSIS — R7303 Prediabetes: Secondary | ICD-10-CM | POA: Diagnosis not present

## 2024-05-30 DIAGNOSIS — I10 Essential (primary) hypertension: Secondary | ICD-10-CM | POA: Diagnosis not present

## 2024-05-30 NOTE — Progress Notes (Signed)
 Established Patient Office Visit  Subjective   Patient ID: Brian Craig, male    DOB: [birth year]  Age: 73 y.o. MRN: [id number]  Chief Complaint  Patient presents with   Medical Management of Chronic Issues    HPI  Brian Craig is a 73 y.o. male would presents to the clinic for follow up on HTN, HLD, pre-DM.   HTN: Patient currently taking carvedilol  and lisinopril  for control of their blood pressure. Reports excellence compliance with this medication. Denies side effects.  Reports that he has not recently checked his blood pressure at home.  Does endorse occasional episodes of dizziness when transitioning from sitting to standing.  No falls.  Denies CP, SOB, Palpitations, vision changes, HA, or edema.  HLD: Patient currently taking simvastatin  40 mg daily for their cholesterol. Reports excellent compliance with this medication. Denies side effects/new myalgias.   Pre-DM: Patient currently taking Jardiance  10 mg as prescribed by his cardiologist for heart benefit.  Patient has also recently started on Mounjaro injections prescribed through [facility].  He has lost 18 pounds and is tolerating the medication well.  They don't check BG at home. Denies polydipsia/polyruria.  Denies new sores/wounds that are not healing.   Mood: Patient currently taking Zoloft  daily and Xanax  as needed for anxiety.  Patient reports that he is tolerating these medications well and without side effects.  Reports that a 30 pill supply of Xanax  will last him 5 to 6 months.  Reports mood is improved and stable.  Happy with his current doses of medication.  Denies SI/HI.    ROS Per HPI.    Objective:     BP 104/67   Pulse 86   Ht 6' (1.829 m)   Wt 292 lb (132.5 kg)   SpO2 96%   BMI 39.60 kg/m    Physical Exam Constitutional:      General: He is not in acute distress.    Appearance: Normal appearance.   Cardiovascular:     Rate and Rhythm: Normal rate and regular rhythm.     Heart sounds: Normal  heart sounds. No murmur heard.    No friction rub. No gallop.  Pulmonary:     Effort: Pulmonary effort is normal. No respiratory distress.     Breath sounds: Normal breath sounds.  Abdominal:     General: Abdomen is flat.     Palpations: Abdomen is soft.     Tenderness: There is no abdominal tenderness.   Musculoskeletal:        General: No swelling.     Cervical back: Normal range of motion.  Lymphadenopathy:     Cervical: No cervical adenopathy.   Skin:    General: Skin is warm and dry.   Neurological:     General: No focal deficit present.     Mental Status: He is alert.   Psychiatric:        Mood and Affect: Mood normal.        Behavior: Behavior normal.        Thought Content: Thought content normal.     No results found for any visits on 05/30/24.  Last CBC Lab Results  Component Value Date   WBC 8.0 05/02/2024   HGB 16.1 05/02/2024   HCT 48.4 05/02/2024   MCV 96 05/02/2024   MCH 32.1 05/02/2024   RDW 12.2 05/02/2024   PLT 231 05/02/2024   Last metabolic panel Lab Results  Component Value Date   GLUCOSE 97 05/02/2024   NA 140  05/02/2024   K 3.8 05/02/2024   CL 100 05/02/2024   CO2 20 05/02/2024   BUN 8 05/02/2024   CREATININE 0.83 05/02/2024   EGFR 93 05/02/2024   CALCIUM  9.3 05/02/2024   PROT 6.6 05/02/2024   ALBUMIN 4.3 05/02/2024   LABGLOB 2.3 05/02/2024   AGRATIO 1.5 04/12/2023   BILITOT 0.9 05/02/2024   ALKPHOS 100 05/02/2024   AST 18 05/02/2024   ALT 12 05/02/2024   Last lipids Lab Results  Component Value Date   CHOL 102 05/02/2024   HDL 30 (L) 05/02/2024   LDLCALC 49 05/02/2024   TRIG 129 05/02/2024   CHOLHDL 3.4 05/02/2024   Last hemoglobin A1c Lab Results  Component Value Date   HGBA1C 5.3 05/02/2024   Last thyroid  functions Lab Results  Component Value Date   TSH 2.150 10/31/2023   T3TOTAL 84 06/30/2021   T4TOTAL 9.0 10/31/2023      The ASCVD Risk score (Arnett DK, et al., 2019) failed to calculate for the  following reasons:   The valid total cholesterol range is 130 to 320 mg/dL    Assessment & Plan:   Tobacco abuse -     CT CHEST LUNG CANCER SCREENING LOW DOSE WO CONTRAST; Future  Essential hypertension Assessment & Plan: Pressure goal <130/80.  Blood pressure stable and at goal.  Continue carvedilol  80 mg daily and lisinopril  10 mg daily.  Continue furosemide  40 mg and Jardiance  10 mg daily.  Followed by cardiology.  Advised patient that as he continues to lose weight, it may be necessary to decrease the dosage of his blood pressure medication.  Advised him to monitor for symptoms of faintness or dizziness upon standing and notify of any changes.  Will continue to monitor.   Pure hypercholesterolemia Assessment & Plan: Last lipid panel: LDL 49, HDL 30, triglycerides 129.  Stable.  Continue simvastatin  40 mg daily.  Anticipate that lipids will improve further as he continues to lose weight with Mounjaro.  CMP within normal limits.  Will continue to monitor.   Anxiety Assessment & Plan: Mood improved/stable.  Continue Zoloft  50 mg daily.  Continue alprazolam  0.5 mg as needed for breakthrough anxiety.  PDMP reviewed today, no aberrancies however not time for refill on Xanax  yet.  Will continue to monitor.   Prediabetes Assessment & Plan: A1c decreased to 5.3.  Continue Jardiance  10 mg as prescribed by cardiology and Mounjaro injections as prescribed through [facility].  Will continue to monitor.    Return in about 6 months (around 11/29/2024) for HTN, HLD, pre-DM.    Odilia Bennett, PA-C

## 2024-05-30 NOTE — Patient Instructions (Signed)
 It was nice to see you today!  As we discussed in clinic:  -Continue your current medications as prescribed. I recommended checking your blood pressure periodically at home and monitoring for symptoms of low blood pressure because as you continue to lose weight, we may need to decrease your dosage. -Keep up the great work with weight loss! You've lost 18 lbs, which is amazing! Continue walking daily and prioritizing high protein diet.  -I recommend you get your tetanus vaccine from the pharmacy as you are due for a booster.  -I have also placed the referral for lung cancer screening, which they will call you to schedule.   -I will plan on seeing you back in 6 months to check in with labs the week before! It was nice to meet you!  If you have any problems before your next visit feel free to message me via MyChart (minor issues or questions) or call the office, otherwise you may reach out to schedule an office visit.  Thank you! Meryl Acosta, PA-C

## 2024-05-30 NOTE — Assessment & Plan Note (Signed)
 Last lipid panel: LDL 49, HDL 30, triglycerides 129.  Stable.  Continue simvastatin  40 mg daily.  Anticipate that lipids will improve further as he continues to lose weight with Mounjaro.  CMP within normal limits.  Will continue to monitor.

## 2024-05-30 NOTE — Assessment & Plan Note (Signed)
 Mood improved/stable.  Continue Zoloft  50 mg daily.  Continue alprazolam  0.5 mg as needed for breakthrough anxiety.  PDMP reviewed today, no aberrancies however not time for refill on Xanax  yet.  Will continue to monitor.

## 2024-05-30 NOTE — Assessment & Plan Note (Signed)
 Pressure goal <130/80.  Blood pressure stable and at goal.  Continue carvedilol  80 mg daily and lisinopril  10 mg daily.  Continue furosemide  40 mg and Jardiance  10 mg daily.  Followed by cardiology.  Advised patient that as he continues to lose weight, it may be necessary to decrease the dosage of his blood pressure medication.  Advised him to monitor for symptoms of faintness or dizziness upon standing and notify of any changes.  Will continue to monitor.

## 2024-05-30 NOTE — Assessment & Plan Note (Signed)
 A1c decreased to 5.3.  Continue Jardiance  10 mg as prescribed by cardiology and Mounjaro injections as prescribed through [facility].  Will continue to monitor.

## 2024-06-01 ENCOUNTER — Other Ambulatory Visit: Payer: Self-pay | Admitting: Family Medicine

## 2024-06-01 DIAGNOSIS — F419 Anxiety disorder, unspecified: Secondary | ICD-10-CM

## 2024-06-06 ENCOUNTER — Encounter: Payer: Self-pay | Admitting: Cardiovascular Disease

## 2024-06-06 ENCOUNTER — Ambulatory Visit: Attending: Cardiology | Admitting: Cardiovascular Disease

## 2024-06-06 VITALS — BP 108/64 | HR 74 | Ht 72.0 in | Wt 288.2 lb

## 2024-06-06 DIAGNOSIS — I5042 Chronic combined systolic (congestive) and diastolic (congestive) heart failure: Secondary | ICD-10-CM | POA: Diagnosis not present

## 2024-06-06 DIAGNOSIS — I442 Atrioventricular block, complete: Secondary | ICD-10-CM | POA: Diagnosis not present

## 2024-06-06 DIAGNOSIS — Z7901 Long term (current) use of anticoagulants: Secondary | ICD-10-CM | POA: Insufficient documentation

## 2024-06-06 DIAGNOSIS — G4733 Obstructive sleep apnea (adult) (pediatric): Secondary | ICD-10-CM | POA: Diagnosis not present

## 2024-06-06 DIAGNOSIS — E785 Hyperlipidemia, unspecified: Secondary | ICD-10-CM | POA: Diagnosis not present

## 2024-06-06 DIAGNOSIS — I4821 Permanent atrial fibrillation: Secondary | ICD-10-CM | POA: Diagnosis not present

## 2024-06-06 DIAGNOSIS — Z95810 Presence of automatic (implantable) cardiac defibrillator: Secondary | ICD-10-CM | POA: Insufficient documentation

## 2024-06-06 DIAGNOSIS — D6869 Other thrombophilia: Secondary | ICD-10-CM

## 2024-06-06 NOTE — Progress Notes (Signed)
 Order for new Brian Craig 11 sent to [facility] today

## 2024-06-06 NOTE — Patient Instructions (Signed)
 Medication Instructions:  Your physician recommends that you continue on your current medications as directed. Please refer to the Current Medication list given to you today.  *If you need a refill on your cardiac medications before your next appointment, please call your pharmacy*  Follow-Up: At [facility], you and your health needs are our priority.  As part of our continuing mission to provide you with exceptional heart care, our providers are all part of one team.  This team includes your primary Cardiologist (physician) and Advanced Practice Providers or APPs (Physician Assistants and Nurse Practitioners) who all work together to provide you with the care you need, when you need it.  Your next appointment:   As needed    Provider:   Dr. Wilbert Bihari   We recommend signing up for the patient portal called MyChart.  Sign up information is provided on this After Visit Summary.  MyChart is used to connect with patients for Virtual Visits (Telemedicine).  Patients are able to view lab/test results, encounter notes, upcoming appointments, etc.  Non-urgent messages can be sent to your provider as well.   To learn more about what you can do with MyChart, go to [email or web address].

## 2024-06-06 NOTE — Progress Notes (Addendum)
 Cardiology Office Note    Date:  06/10/2024   ID:  Brian Craig, DOB 1951/06/28, MRN [id number]  PCP:  Gayle Saddie FALCON, PA-C  Cardiologist:  Debby Sor, MD (sleep); Dr. Francyne  19 month F/U sleep evaluation initially referred by Dr. Lucie  History of Present Illness:  Brian Craig is a 73 y.o. male is followed by Dr. Francyne for cardiology disease.  He has a history of severe sleep apnea and has been on BiPAP therapy.  I have not seen him since November 2018.  He recently was evaluated by Dr. Francyne oh and had complaints of recent fatigue and hypersomnolence despite being compliant with BiPAP therapy.  I saw him for initial sleep evaluation on November 08, 2022.  He presents now for a 67-month follow-up evaluation.   Brian Craig has a history of prior severe cardiomyopathy related to uncontrolled atrial fibrillation with rapid ventricular response that started in the setting of thyrotoxicosis.  His left ventricle ejection fraction had been as low as 17%.  He on underwent AV node ablation and received a biventricular pacemaker/defibrillator in 2007.  In 2007.  Nocturia was also evaluated in [place], [place]  for sleep apnea, which was felt to be severe.  Although I do not have these records, I suspect he may of had significant central apneic events in the setting of his LV dysfunction such that since 2007.  He has been using a ResMed VPAP adapt SV unit.  He admits to 100% compliance and cannot sleep without it.  Recently, his 74 year old adapt servoventilation unit has begun to malfunction.  He has wire fragments and has been working intermittently.  He uses a fullface mask.  He typically goes to bed at midnight and wakes up at 8 AM.  Previously, he had awakened frequently with nocturia, but with treatment.  He only wakes up 1 time per night for urination.  Since 2007, he admits to a 30 pound weight gain.  His LV function has improved and reportedly had increased to a proximally 50%,  but he has not had a recent echo.  With his recent machine malfunction, he now presents for sleep evaluation in order to obtain a new unit for treatment of his obstructive sleep apnea.  He has been under the DME company of [employer] which is is in [place] .  He had not established with the DME company locally despite living in [place]  since 2015.  Typically he is in bed by midnight and wakes up at 8 AM.  He admits to 100% compliance.  When I initially saw him in May 2018 an Epworth scale was calculated in the office as shown below:  Epworth Sleepiness Scale: Situation   Chance of Dozing/Sleeping (0 = never , 1 = slight chance , 2 = moderate chance , 3 = high chance )   sitting and reading 0   watching TV 1   sitting inactive in a public place 0   being a passenger in a motor vehicle for an hour or more 0   lying down in the afternoon 3   sitting and talking to someone 0   sitting quietly after lunch (no alcohol) 0   while stopped for a few minutes in traffic as the driver 0   Total Score  4   He underwent a follow-up sleep study on 05/12/2017.  He had severe obstructive sleep apnea with an HI 57.7 per hour and RDI of 71.9 per hour.  Events were  worse with supine posture with an HI of 71.2 per hour.  He had reduced sleep efficiency of 49.4% and as result, was unable to do a split-night protocol.  Oxygen desaturated to 88%.  There was moderate snoring.  On 07/19/2017 he underwent CPAP titration and required BiPAP therapy titrated to 16/12.  He had significant periodic limb movement disorder of sleep with an index of 112.  When I had seen him in 2018 a download demonstrated excellent compliance with AHI of 1.3.  He was consistently using CPAP 100% of the time with average use of 8 hours and 31 minutes.  An Epworth scale was 1 with therapy argue against residual daytime sleepiness.  Over the last 5 years, Brian Craig has been followed by Dr. Francyne.  He has a history of complete heart block, status  post AV node ablation and is pacemaker dependent.  He has been maintained on anticoagulation therapy with his CHA2DS2-VASc score of 3.  On August 11, 2022 he underwent BiV ICD generator change out by Dr. Francyne due to device battery depletion.  his LV function had recovered on maximum tolerated doses of ACE inhibitor carvedilol , and Jardiance .  Occasionally he was requiring an extra dose of diuretic for volume overload.  I saw him for an initial sleep evaluation on November 08, 2022.  At that time he was noticing more fatigability.   He has continued to use his old BiPAP unit has a set up date of August 22, 2017 and is a ResMed air curve 10 via auto unit.  A download was obtained from October 24 through November 03, 2022 which shows 100% device used.  Average use was 9 hours and 9 minutes per night.  His BiPAP was set at a pressure range of 16/12.  AHI was 2.5.  He has been using a Quatro fullface mask.  An Epworth Sleepiness Scale score was calculated in the office today and this endorsed 3.  He continues to have issues with morbid obesity with BMI of 46.14.  He has a history of hypothyroidism.  During that evaluation his BiPAP device had been set on a fixed pressure and I adjusted this to an auto mode with EPAP min of 12 and change IPAP max to 20 with pressure support of 4.  I had a lengthy discussion with him regarding cardiovascular benefit with continued BiPAP use particularly with his comorbidities.  Since I last saw him, he has continued to use BiPAP.  [employer] with [employer] is his DME company.  A download was obtained from May 26 through June 05, 2024.  Usage is 100% with average use at 9 hours and 26 minutes.  He apparently is in spontaneous mode at a IPAP of 20 and EPAP of 12; AHI is 3.5.  He is sleeping well.  He has lost over 60 pounds since initiating Mounjaro.  Epworth scale endorsed 0.  He sees Saddie Sacks, NP for primary care.  He presents for evaluation.  Past Medical History:  Diagnosis  Date   Atrial fibrillation (HCC)    CHF (congestive heart failure) (HCC)    Graves disease    Graves   Hyperlipidemia    Hypertension    OSA treated with BiPAP    Substance abuse (HCC)    alcohol    Past Surgical History:  Procedure Laterality Date   APPENDECTOMY     BIV ICD GENERATOR CHANGEOUT N/A 08/11/2022   Procedure: BIV ICD GENERATOR CHANGEOUT;  Surgeon: Fernande Elspeth BROCKS, MD;  Location:  [facility];  Service: Cardiovascular;  Laterality: N/A;   BIV ICD GENERTAOR CHANGE OUT Left 2012   Medtronic Protecta   CARDIAC ELECTROPHYSIOLOGY STUDY AND ABLATION     EP IMPLANTABLE DEVICE N/A 05/06/2015   Procedure: ICD Generator Changeout;  Surgeon: Jerel Balding, MD;  Location: [facility];  Service: Cardiovascular;  Laterality: N/A;   HERNIA REPAIR     MENISCUS REPAIR Right    VASECTOMY     unilateral due to injury    Current Medications: Outpatient Medications Prior to Visit  Medication Sig Dispense Refill   acetaminophen  (TYLENOL ) 500 MG tablet Take 1,000 mg by mouth every 6 (six) hours as needed for moderate pain.     ALPRAZolam  (XANAX ) 0.5 MG tablet TAKE 1 TABLET AS NEEDED FOR ANXIETY 30 tablet 0   amoxicillin -clavulanate (AUGMENTIN ) 875-125 MG tablet Take 1 tablet by mouth 2 (two) times daily. 14 tablet 0   Carboxymethylcellulose Sodium (LUBRICANT EYE DROPS OP) Place 1 drop into both eyes at bedtime.     carvedilol  (COREG  CR) 80 MG 24 hr capsule TAKE 1 CAPSULE DAILY 90 capsule 3   cholecalciferol (VITAMIN D3) 25 MCG (1000 UNIT) tablet Take 1,000 Units by mouth daily.     furosemide  (LASIX ) 40 MG tablet TAKE 1 TABLET (40 MG) EVERY OTHER DAY ALTERNATING WITH 2 TABLETS (80 MG) EVERY OTHER DAY 135 tablet 3   JARDIANCE  10 MG TABS tablet TAKE 1 TABLET DAILY BEFORE BREAKFAST 90 tablet 3   levothyroxine (SYNTHROID) 125 MCG tablet Take 125 mcg by mouth daily before breakfast. Patient takes 2 tablets every morning before breakfast     lisinopril  (ZESTRIL ) 10 MG tablet TAKE 1  TABLET TWICE A DAY 180 tablet 2   Melatonin 5 MG CAPS Take 5 mg by mouth at bedtime.     Multiple Vitamin (MULTIVITAMIN) tablet Take 1 tablet by mouth daily.     pantoprazole (PROTONIX) 40 MG tablet Take 40 mg by mouth daily.     potassium chloride  (KLOR-CON ) 10 MEQ tablet Take 1 tablet (10 mEq total) by mouth daily. 90 tablet 2   PRADAXA  150 MG CAPS capsule TAKE 1 CAPSULE TWICE DAILY 180 capsule 3   SEMAGLUTIDE,0.25 OR 0.5MG /DOS, Stanton Inject 0.25 mg into the skin once a week.     sertraline  (ZOLOFT ) 50 MG tablet TAKE 1 TABLET DAILY 90 tablet 3   simvastatin  (ZOCOR ) 40 MG tablet TAKE 1 TABLET DAILY AT 6 P.M. 90 tablet 3   No facility-administered medications prior to visit.     Allergies:   Patient has no known allergies.   Social History   Socioeconomic History   Marital status: Married    Spouse name: Not on file   Number of children: Not on file   Years of education: Not on file   Highest education level: Bachelor's degree (e.g., BA, AB, BS)  Occupational History   Not on file  Tobacco Use   Smoking status: Every Day    Current packs/day: 0.50    Average packs/day: 0.5 packs/day for 51.0 years (25.5 ttl pk-yrs)    Types: Cigarettes    Passive exposure: Never   Smokeless tobacco: Never   Tobacco comments:    Half of pack daily    08/16/2024 Patient smoke 1/2 pack daily  Vaping Use   Vaping status: Never Used  Substance and Sexual Activity   Alcohol use: No    Comment: recovering alcoholic   Drug use: No   Sexual activity: Yes  Birth control/protection: None  Other Topics Concern   Not on file  Social History Narrative   Not on file   Social Drivers of Health   Financial Resource Strain: Low Risk  (05/26/2024)   Overall Financial Resource Strain (CARDIA)    Difficulty of Paying Living Expenses: Not hard at all  Food Insecurity: No Food Insecurity (05/26/2024)   Hunger Vital Sign    Worried About Running Out of Food in the Last Year: Never true    Ran Out of Food  in the Last Year: Never true  Transportation Needs: No Transportation Needs (05/26/2024)   PRAPARE - Administrator, Civil Service (Medical): No    Lack of Transportation (Non-Medical): No  Physical Activity: Insufficiently Active (05/26/2024)   Exercise Vital Sign    Days of Exercise per Week: 2 days    Minutes of Exercise per Session: 30 min  Stress: No Stress Concern Present (05/26/2024)   [employer] of Occupational Health - Occupational Stress Questionnaire    Feeling of Stress: Only a little  Social Connections: Moderately Isolated (05/26/2024)   Social Connection and Isolation Panel    Frequency of Communication with Friends and Family: More than three times a week    Frequency of Social Gatherings with Friends and Family: More than three times a week    Attends Religious Services: Never    Database administrator or Organizations: No    Attends Engineer, structural: Not on file    Marital Status: Married   Additional social history is notable in that he is a retired Art gallery manager at [employer].    Family History:  The patient's family history includes Alcohol abuse in his maternal grandfather and maternal uncle; Heart attack in his father; Heart disease in his father, mother, paternal aunt, and paternal uncle; Hyperlipidemia in his father; Hypertension in his father and mother.   ROS General: Negative; No fevers, chills, or night sweats;  HEENT: Negative; No changes in vision or hearing, sinus congestion, difficulty swallowing Pulmonary: Negative; No cough, wheezing, shortness of breath, hemoptysis Cardiovascular: Negative; No chest pain, presyncope, syncope, palpitations; history of PAF with remote severe LV dysfunction, which did improve following ablation, and bi pacing GI: Negative; No nausea, vomiting, diarrhea, or abdominal pain GU: Negative; No dysuria, hematuria, or difficulty voiding Musculoskeletal: Negative; no myalgias, joint pain, or  weakness Hematologic/Oncology: Negative; no easy bruising, bleeding Endocrine: Negative; no heat/cold intolerance; no diabetes Neuro: Negative; no changes in balance, headaches Skin: Negative; No rashes or skin lesions Psychiatric: Negative; No behavioral problems, depression Sleep: Previously on VPAP adapt servoventilation since 2007; admits to 100% compliance.  Now currently on BiPAP and he received a ResMed AirCurve 10 VAuto unit on August 22, 2017.  No residual snoring or daytime sleepiness, hypersomnolence, bruxism, restless legs, hypnogognic hallucinations, no cataplexy; Other comprehensive 14 point system review is negative.   PHYSICAL EXAM:   VS:  BP 108/64   Pulse 74   Ht 6' (1.829 m)   Wt 288 lb 3.2 oz (130.7 kg)   SpO2 97%   BMI 39.09 kg/m     Repeat blood pressure by me was 106/64  Wt Readings from Last 3 Encounters:  06/06/24 288 lb 3.2 oz (130.7 kg)  05/30/24 292 lb (132.5 kg)  01/31/24 (!) 310 lb 8 oz (140.8 kg)    General: Alert, oriented, no distress.  Skin: normal turgor, no rashes, warm and dry HEENT: Normocephalic, atraumatic. Pupils equal round and reactive to light; sclera  anicteric; extraocular muscles intact;  Nose without nasal septal hypertrophy Mouth/Parynx benign; Mallinpatti scale 3 Neck: No JVD, no carotid bruits; normal carotid upstroke Lungs: clear to ausculatation and percussion; no wheezing or rales Chest wall: without tenderness to palpitation Heart: PMI not displaced, RRR, s1 s2 normal, 1/6 systolic murmur, no diastolic murmur, no rubs, gallops, thrills, or heaves Abdomen: soft, nontender; no hepatosplenomehaly, BS+; abdominal aorta nontender and not dilated by palpation. Back: no CVA tenderness Pulses 2+ Musculoskeletal: full range of motion, normal strength, no joint deformities Extremities: no clubbing cyanosis or edema, Homan's sign negative  Neurologic: grossly nonfocal; Cranial nerves grossly wnl Psychologic: Normal mood and  affect     Studies/Labs Reviewed:   EKG Interpretation Date/Time:  Wednesday June 06 2024 09:35:48 EDT Ventricular Rate:  74 PR Interval:    QRS Duration:  152 QT Interval:  446 QTC Calculation: 495 R Axis:   -48  Text Interpretation: Ventricular-paced rhythm When compared with ECG of 17-Aug-2023 09:19, No significant change was found Confirmed by Burnard Ned ([id number]) on 06/06/2024 10:21:42 AM     November 08, 2022 ECG (independently read by me): Ventricular paced rhythm with biventricular pacing at 75  May 2018 ECG (independently read by me): Ventricular paced rhythm with biventricular pacing.  Ventricular rate 75 bpm.  Recent Labs:    Latest Ref Rng & Units 05/02/2024    8:52 AM 10/31/2023    9:40 AM 04/12/2023    9:34 AM  BMP  Glucose 70 - 99 mg/dL 97  97  895   BUN 8 - 27 mg/dL 8  15  11    Creatinine 0.76 - 1.27 mg/dL 9.16  9.01  8.89   BUN/Creat Ratio 10 - 24 10  15  10    Sodium 134 - 144 mmol/L 140  142  138   Potassium 3.5 - 5.2 mmol/L 3.8  4.4  4.0   Chloride 96 - 106 mmol/L 100  106  98   CO2 20 - 29 mmol/L 20  24  19    Calcium  8.6 - 10.2 mg/dL 9.3  9.3  9.4         Latest Ref Rng & Units 05/02/2024    8:52 AM 10/31/2023    9:40 AM 04/12/2023    9:34 AM  Hepatic Function  Total Protein 6.0 - 8.5 g/dL 6.6  6.5  6.8   Albumin 3.8 - 4.8 g/dL 4.3  4.2  4.1   AST 0 - 40 IU/L 18  16  20    ALT 0 - 44 IU/L 12  13  15    Alk Phosphatase 44 - 121 IU/L 100  80  92   Total Bilirubin 0.0 - 1.2 mg/dL 0.9  0.6  0.6        Latest Ref Rng & Units 05/02/2024    8:52 AM 10/31/2023    9:40 AM 04/12/2023    9:34 AM  CBC  WBC 3.4 - 10.8 x10E3/uL 8.0  9.0  9.7   Hemoglobin 13.0 - 17.7 g/dL 83.8  82.9  83.1   Hematocrit 37.5 - 51.0 % 48.4  50.4  50.5   Platelets 150 - 450 x10E3/uL 231  219  234    Lab Results  Component Value Date   MCV 96 05/02/2024   MCV 97 10/31/2023   MCV 97 04/12/2023   Lab Results  Component Value Date   TSH 2.150 10/31/2023   Lab Results   Component Value Date   HGBA1C 5.3 05/02/2024  BNP    Component Value Date/Time   BNP 108.4 (H) 02/02/2021 1424    ProBNP No results found for: PROBNP   Lipid Panel     Component Value Date/Time   CHOL 102 05/02/2024 0852   TRIG 129 05/02/2024 0852   HDL 30 (L) 05/02/2024 0852   CHOLHDL 3.4 05/02/2024 0852   CHOLHDL 3.6 02/09/2017 1103   VLDL 25 02/09/2017 1103   LDLCALC 49 05/02/2024 0852     RADIOLOGY: No results found.    Additional studies/ records that were reviewed today include:  I reviewed the patient's records from Dr. Francyne and Dr. Roma.    No download was obtained from October 24 through November 03, 2022.  ASSESSMENT:    1. CHB (complete heart block) (HCC)   2. OSA on BiPAP   3. Biventricular automatic implantable cardioverter defibrillator in situ   4. Permanent atrial fibrillation (HCC)   5. Anticoagulated   6. Chronic combined systolic and diastolic CHF, NYHA class 1 (HCC)   7. Dyslipidemia (high LDL; low HDL)     PLAN:  Brian Craig is a 72 year-old gentleman who has a history of severely reduced LV dysfunction with EF as low as 17% in 2007.  At that time he underwent a sleep study and was told of having very severe sleep apnea and I suspect he had complex sleep apnea syndrome with central events and even possibly Cheyne-Stokes respiration for which he was remotely treated with ASV.  He is status post AV node ablation and and is pacemaker dependent with improvement in LV function with previous CRT D.  An echo Doppler study in February 2022 showed an EF of 55 to 60% without wall motion abnormalities.  There was mild concentric LVH's.  He had moderate biatrial enlargement.  He underwent BiV ICD generator change out on August 11, 2022 by Dr. Francyne.  In 2018, he underwent reevaluation of sleep apnea and again was found to have severe obstructive sleep apnea with an overall AHI 57.7/h, an RDI of 71.9/h.  AHI while supine was 71.2/h.  He  received a ResMed AirCurve 10 VAuto BiPAP unit and has been consistently using BiPAP with a pressure at 16/12 since his set up date on August 22, 2017.  When seen by me for his sleep evaluation in 2018 he was using a So Clean cleansing device.  I discussed with him today concerns with ozone cleaning and reviewed with him the need for recall of some of the Respironics devices due to Styrofoam breakdown.  I suggest that he discontinue So Clean use but use soap and water for cleansing.  Since receiving his BiPAP machine in 2018 he has been consistent with compliance and never sleeps without it.  An Epworth Sleepiness Scale score was calculated in the office today and this endorsed at 3 arguing against residual daytime sleepiness.  When I saw him in November 2023 his current BiPAP unit is set at a fixed pressure.  Since he has an auto device, I will change him to an auto mode and will keep EPAP min 12 and change in IPAP max to 20.  Pressure support is 4.  During that evaluation I had an extensive discussion regarding significant benefit with continued use particularly with his cardiovascular health.  He continues to use BiPAP with 100% compliance.  A download was obtained in the office today which shows 100% use with average use at 9 hours and 26 minutes.  Although he is supposed to  be in an auto mode it appears he is in a spontaneous mode with a EPAP pressure of 12 and IPAP pressure of 20.  AHI is 3.5.  He is sleeping well with Epworth scale of 0 and has no residual sleepiness.  I have recommended changing his spontaneous mode to an auto mode.  He is aware of my imminent retirement.  He qualifies for a new ResMed AirCurve 11 via auto unit.  He will need this to be ordered by [employer] and once he receives a future machine he will need compliance evaluation with Dr. Shlomo within 90 days of receiving his new device.  His blood pressure today is stable on carvedilol  CR 80 mg, furosemide  lisinopril  10 mg.  He is now on  Mounjaro and admits to a 60 pound weight loss.  He sees Saddie Sacks, [place] for primary care and we will see Dr. Francyne for pacemaker follow-up.  Sleep follow-up will be with Dr. Shlomo.   Medication Adjustments/Labs and Tests Ordered: Current medicines are reviewed at length with the patient today.  Concerns regarding medicines are outlined above.  Medication changes, Labs and Tests ordered today are listed in the Patient Instructions below. Patient Instructions  Medication Instructions:  Your physician recommends that you continue on your current medications as directed. Please refer to the Current Medication list given to you today.  *If you need a refill on your cardiac medications before your next appointment, please call your pharmacy*   Follow-Up: At [facility], you and your health needs are our priority.  As part of our continuing mission to provide you with exceptional heart care, our providers are all part of one team.  This team includes your primary Cardiologist (physician) and Advanced Practice Providers or APPs (Physician Assistants and Nurse Practitioners) who all work together to provide you with the care you need, when you need it.  Your next appointment:   As needed  Provider:   Dr. Wilbert Shlomo   We recommend signing up for the patient portal called MyChart.  Sign up information is provided on this After Visit Summary.  MyChart is used to connect with patients for Virtual Visits (Telemedicine).  Patients are able to view lab/test results, encounter notes, upcoming appointments, etc.  Non-urgent messages can be sent to your provider as well.   To learn more about what you can do with MyChart, go to [email or web address].    Signed, Debby Sor, MD, [facility], ABSM Diplomate, American Board of Sleep Medicine  06/10/2024 3:10 PM    [facility] [address], [place], [place]  [address] Phone: [phone]

## 2024-06-08 ENCOUNTER — Ambulatory Visit
Admission: RE | Admit: 2024-06-08 | Discharge: 2024-06-08 | Disposition: A | Discharge status: Home / Self Care | Source: Ambulatory Visit

## 2024-06-08 DIAGNOSIS — Z122 Encounter for screening for malignant neoplasm of respiratory organs: Secondary | ICD-10-CM | POA: Diagnosis not present

## 2024-06-08 DIAGNOSIS — Z720 Tobacco use: Secondary | ICD-10-CM

## 2024-06-08 DIAGNOSIS — F17210 Nicotine dependence, cigarettes, uncomplicated: Secondary | ICD-10-CM | POA: Diagnosis not present

## 2024-06-10 ENCOUNTER — Encounter: Payer: Self-pay | Admitting: Cardiovascular Disease

## 2024-06-10 NOTE — Addendum Note (Signed)
 Addended by: Malcolm Hetz A on: 06/10/2024 03:12 PM   Modules accepted: Level of Service

## 2024-06-11 ENCOUNTER — Telehealth: Payer: Self-pay | Admitting: Cardiovascular Disease

## 2024-06-11 NOTE — Telephone Encounter (Signed)
 [employer] needs ov note from 6/25 and script for Bi-Pap faxed to [phone]

## 2024-06-14 NOTE — Telephone Encounter (Signed)
 Spoke to [place] at [employer] and she will advise the sleep team to pull what they need out of Epic.

## 2024-06-20 ENCOUNTER — Other Ambulatory Visit: Payer: Self-pay

## 2024-06-20 DIAGNOSIS — F419 Anxiety disorder, unspecified: Secondary | ICD-10-CM

## 2024-06-20 MED ORDER — ALPRAZOLAM 0.5 MG PO TABS: 0.5000 mg | ORAL_TABLET | Freq: Two times a day (BID) | ORAL | 0 refills | Status: DC | PRN

## 2024-06-25 ENCOUNTER — Other Ambulatory Visit (HOSPITAL_COMMUNITY): Payer: Self-pay

## 2024-06-25 ENCOUNTER — Encounter: Payer: Self-pay | Admitting: Cardiovascular Disease

## 2024-06-25 ENCOUNTER — Telehealth: Payer: Self-pay | Admitting: Pharmacy Technician

## 2024-06-25 NOTE — Telephone Encounter (Signed)
 Pharmacy Patient Advocate Encounter   Received notification from Patient Advice Request messages that prior authorization for Pradaxa  150MG  is required/requested.   Insurance verification completed.   The patient is insured through [employer] .   Per test claim: Refill too soon. PA is not needed at this time. Medication was filled 06/21/24. Next eligible fill date is 08/27/24.

## 2024-07-02 NOTE — Addendum Note (Signed)
 Addended by: VICCI SELLER A on: 07/02/2024 03:01 PM   Modules accepted: Orders

## 2024-07-02 NOTE — Progress Notes (Signed)
 Remote ICD transmission.

## 2024-07-04 NOTE — Telephone Encounter (Signed)
 [employer] calling to request office notes and script be faxed to [phone]

## 2024-07-10 DIAGNOSIS — F331 Major depressive disorder, recurrent, moderate: Secondary | ICD-10-CM | POA: Diagnosis not present

## 2024-07-10 DIAGNOSIS — F50812 Binge eating disorder, severe: Secondary | ICD-10-CM | POA: Diagnosis not present

## 2024-07-10 DIAGNOSIS — F411 Generalized anxiety disorder: Secondary | ICD-10-CM | POA: Diagnosis not present

## 2024-07-10 DIAGNOSIS — K219 Gastro-esophageal reflux disease without esophagitis: Secondary | ICD-10-CM | POA: Diagnosis not present

## 2024-07-10 DIAGNOSIS — E119 Type 2 diabetes mellitus without complications: Secondary | ICD-10-CM | POA: Diagnosis not present

## 2024-07-10 NOTE — Telephone Encounter (Signed)
 Melissa with [employer] called in stating pt is still waiting for OV note from 6/25 to be faxed to them. Fax: [phone]). She states the prescription date will also have to be changed and signed by the provider because it has to be dated after the OV note. Please advise.

## 2024-07-12 ENCOUNTER — Encounter: Payer: Self-pay | Admitting: Cardiovascular Disease

## 2024-07-17 NOTE — Telephone Encounter (Signed)
 Patient called stating [employer] is telling they haven't received the paperwork.  He said he would like copies of it, as he has the name of someone there he can send it directly to.

## 2024-07-20 ENCOUNTER — Telehealth: Payer: Self-pay | Admitting: Cardiology

## 2024-07-20 NOTE — Telephone Encounter (Signed)
 I have received the signed document back from Dr Shlomo and will fax it to [facility] today.

## 2024-07-20 NOTE — Telephone Encounter (Signed)
 Melissa with [employer] is following up regarding replacement CPAP machine. Please advise.

## 2024-07-20 NOTE — Telephone Encounter (Signed)
 [employer] (Melissa) she said the prescription was missing a code needed for medicare so she sent another form for the doctor to sign. I will have her sign it asap.

## 2024-08-08 ENCOUNTER — Ambulatory Visit (INDEPENDENT_AMBULATORY_CARE_PROVIDER_SITE_OTHER): Payer: Medicare Other

## 2024-08-08 DIAGNOSIS — I442 Atrioventricular block, complete: Secondary | ICD-10-CM | POA: Diagnosis not present

## 2024-08-09 ENCOUNTER — Telehealth: Payer: Self-pay

## 2024-08-09 ENCOUNTER — Ambulatory Visit (INDEPENDENT_AMBULATORY_CARE_PROVIDER_SITE_OTHER)

## 2024-08-09 ENCOUNTER — Ambulatory Visit: Payer: Self-pay

## 2024-08-09 ENCOUNTER — Ambulatory Visit: Payer: Self-pay | Admitting: Cardiovascular Disease

## 2024-08-09 VITALS — BP 95/62 | HR 79 | Ht 72.0 in | Wt 271.2 lb

## 2024-08-09 DIAGNOSIS — R6889 Other general symptoms and signs: Secondary | ICD-10-CM

## 2024-08-09 LAB — CUP PACEART REMOTE DEVICE CHECK
Battery Remaining Longevity: 61 mo
Battery Voltage: 2.99 V
Brady Statistic AP VP Percent: 0 %
Brady Statistic AP VS Percent: 0 %
Brady Statistic AS VP Percent: 0 %
Brady Statistic AS VS Percent: 0 %
Brady Statistic RA Percent Paced: 0 %
Brady Statistic RV Percent Paced: 99.43 %
Date Time Interrogation Session: 20250827022724
HighPow Impedance: 149 Ohm
HighPow Impedance: 152 Ohm
Implantable Lead Connection Status: 753985
Implantable Lead Connection Status: 753985
Implantable Lead Connection Status: 753985
Implantable Lead Implant Date: 20071217
Implantable Lead Implant Date: 20071217
Implantable Lead Implant Date: 20071217
Implantable Lead Location: 753858
Implantable Lead Location: 753859
Implantable Lead Location: 753860
Implantable Lead Model: 4194
Implantable Lead Model: 5076
Implantable Lead Model: 6947
Implantable Pulse Generator Implant Date: 20230830
Lead Channel Impedance Value: 1064 Ohm
Lead Channel Impedance Value: 361 Ohm
Lead Channel Impedance Value: 399 Ohm
Lead Channel Impedance Value: 703 Ohm
Lead Channel Impedance Value: 760 Ohm
Lead Channel Impedance Value: 931 Ohm
Lead Channel Pacing Threshold Amplitude: 0.875 V
Lead Channel Pacing Threshold Amplitude: 1.375 V
Lead Channel Pacing Threshold Pulse Width: 0.4 ms
Lead Channel Pacing Threshold Pulse Width: 0.8 ms
Lead Channel Setting Pacing Amplitude: 2.5 V
Lead Channel Setting Pacing Amplitude: 2.5 V
Lead Channel Setting Pacing Pulse Width: 0.4 ms
Lead Channel Setting Pacing Pulse Width: 0.8 ms
Lead Channel Setting Sensing Sensitivity: 0.3 mV

## 2024-08-09 LAB — POC COVID19/FLU A&B COMBO
Covid Antigen, POC: POSITIVE — AB
Influenza A Antigen, POC: NEGATIVE
Influenza B Antigen, POC: NEGATIVE

## 2024-08-09 MED ORDER — NIRMATRELVIR/RITONAVIR (PAXLOVID)TABLET: 3.0000 | ORAL_TABLET | Freq: Two times a day (BID) | ORAL | 0 refills | Status: AC

## 2024-08-09 NOTE — Progress Notes (Signed)
 SUBJECTIVE:  Brian Craig is a 73 y.o. male who complains of congestion, productive cough, myalgias, and chills for 3 days. He denies a history of shortness of breath and wheezing and denies a history of asthma. Patient denies sick contacts or recent travel. Has been using Musinex as needed.    OBJECTIVE: He is ill-appearing, vital signs are as noted. Ears normal.  Throat and pharynx normal.  Neck supple. No adenopathy in the neck. Nose is congested. Sinuses non tender. The chest is clear, without wheezes or rales.  Vitals:   08/09/24 1139  BP: 95/62  Pulse: 79  SpO2: 98%    ASSESSMENT:  COVID-19 infection per positive POC nasal swab  PLAN: Start Paxlovid  anti-viral therapy given patient's age and higher risk of complication.   Advised patient to hold his simvastatin  while on Paxlovid , which he verbalized understanding.  Symptomatic therapy suggested: push fluids, rest, and return office visit prn if symptoms persist or worsen. Lack of antibiotic effectiveness discussed with him. Advised regular scheduled for Tylenol  every 6-8 hours with ibuprofen in between if needed. Not to exceed 3000 mg of Tylenol  with 24 hour period. Call or return to clinic prn if these symptoms worsen or fail to improve as anticipated.  Advised on when to go to ED for emergency care (worsening SOB, wheeze, chest pain, high fever, etc.).   Saddie JULIANNA Sacks, PA-C

## 2024-08-09 NOTE — Telephone Encounter (Addendum)
 FYI Only or Action Required?: Action required by provider: Advised UC/ ED, no available appt today.  Patient was last seen in primary care on 11/22/2022 by Hanford Powell BRAVO, NP.  Called Nurse Triage reporting Generalized Body Aches.Denies difficulty breathing or feeling faint, report dizziness.  Symptoms began several days ago.  Interventions attempted: OTC medications: Mucinex, Tylenol .  Symptoms are: gradually worsening.  Triage Disposition: See Physician Within 24 Hours  Patient/caregiver understands and will follow disposition?: Yes        Reason for Disposition  [1] Known COPD or other severe lung disease (i.e., bronchiectasis, cystic fibrosis, lung surgery) AND [2] symptoms getting worse (i.e., increased sputum purulence or amount, increased breathing difficulty  Answer Assessment - Initial Assessment Questions Advised UC/ED, no available appts today. Pt reports will go to UC.  Denies chest pain, N/V/D Reports productive cough, nasal congestion, body aches, chills, temp 97.0 but pt reports have Graves Disease.   1. ONSET: When did the cough begin?      3 days ago; mucinex and tylenol  2. SEVERITY: How bad is the cough today?      Chills-severe,cough-moderate 3. SPUTUM: Describe the color of your sputum (e.g., none, dry cough; clear, white, yellow, green)     Yellowish brown 4. HEMOPTYSIS: Are you coughing up any blood? If Yes, ask: How much? (e.g., flecks, streaks, tablespoons, etc.)     no 5. DIFFICULTY BREATHING: Are you having difficulty breathing? If Yes, ask: How bad is it? (e.g., mild, moderate, severe)      no 6. FEVER: Do you have a fever? If Yes, ask: What is your temperature, how was it measured, and when did it start?     Hx Graves disease, 97.0 7. CARDIAC HISTORY: Do you have any history of heart disease? (e.g., heart attack, congestive heart failure)      Afib, CHF, pacemaker 8. LUNG HISTORY: Do you have any history of lung  disease?  (e.g., pulmonary embolus, asthma, emphysema)     copd 9. PE RISK FACTORS: Do you have a history of blood clots? (or: recent major surgery, recent prolonged travel, bedridden)     no 10. OTHER SYMPTOMS: Do you have any other symptoms? (e.g., runny nose, wheezing, chest pain)       Runny nose, denies chest pain, little wheezing while sleepting  12. TRAVEL: Have you traveled out of the country in the last month? (e.g., travel history, exposures)       no  Protocols used: Cough - Acute Productive-A-AH

## 2024-08-09 NOTE — Patient Instructions (Signed)
 It was nice to see you today!  As we discussed in clinic:  -You are positive for COVID, which is a viral illness.  I have sent in the antiviral medication, Paxlovid  to [facility] on [address].  Please follow the dosage instructions on the pack when you pick it up.  Please remember to discontinue your simvastatin  while you are taking Paxlovid , as the 2 medications cannot be combined together. - I recommend drinking plenty of fluid to support adequate hydration and help with the Mucinex to work better while you take it. - I would also recommend getting on an alternating schedule of Tylenol  and ibuprofen to help with the body aches and fatigue while your body fights off the virus.  Is safe to take Tylenol  every 6-8 hours over the next couple of days with ibuprofen doses in between if needed. - If you begin to experience extreme shortness of breath, wheezing or chest pain, I recommend emergency care at the ED immediately. -Please keep me updated and let me know if you need anything.  I hope you feel better!  If you have any problems before your next visit feel free to message me via MyChart (minor issues or questions) or call the office, otherwise you may reach out to schedule an office visit.  Thank you! Saddie Sacks, PA-C

## 2024-08-09 NOTE — Telephone Encounter (Signed)
 First attempt; left voice message.     Everything hurts.. congestion, hot and cold shivers.. says he has graves disease - most likely need antibiotics.. ( flu but no fever )

## 2024-08-09 NOTE — Telephone Encounter (Signed)
 Called patient/ made an OV appointment

## 2024-08-12 ENCOUNTER — Other Ambulatory Visit: Payer: Self-pay

## 2024-08-12 DIAGNOSIS — F419 Anxiety disorder, unspecified: Secondary | ICD-10-CM

## 2024-08-16 ENCOUNTER — Ambulatory Visit: Attending: Cardiovascular Disease | Admitting: Cardiovascular Disease

## 2024-08-16 ENCOUNTER — Encounter: Payer: Self-pay | Admitting: Cardiovascular Disease

## 2024-08-16 VITALS — BP 100/60 | HR 88 | Ht 72.0 in | Wt 267.3 lb

## 2024-08-16 DIAGNOSIS — I4821 Permanent atrial fibrillation: Secondary | ICD-10-CM | POA: Diagnosis not present

## 2024-08-16 DIAGNOSIS — D6869 Other thrombophilia: Secondary | ICD-10-CM | POA: Insufficient documentation

## 2024-08-16 DIAGNOSIS — I5032 Chronic diastolic (congestive) heart failure: Secondary | ICD-10-CM | POA: Diagnosis not present

## 2024-08-16 DIAGNOSIS — E890 Postprocedural hypothyroidism: Secondary | ICD-10-CM | POA: Insufficient documentation

## 2024-08-16 DIAGNOSIS — I442 Atrioventricular block, complete: Secondary | ICD-10-CM | POA: Diagnosis not present

## 2024-08-16 DIAGNOSIS — E785 Hyperlipidemia, unspecified: Secondary | ICD-10-CM | POA: Diagnosis not present

## 2024-08-16 DIAGNOSIS — Z95810 Presence of automatic (implantable) cardiac defibrillator: Secondary | ICD-10-CM | POA: Insufficient documentation

## 2024-08-16 DIAGNOSIS — G4733 Obstructive sleep apnea (adult) (pediatric): Secondary | ICD-10-CM | POA: Diagnosis not present

## 2024-08-16 DIAGNOSIS — Z6835 Body mass index (BMI) 35.0-35.9, adult: Secondary | ICD-10-CM | POA: Diagnosis not present

## 2024-08-16 MED ORDER — LISINOPRIL 5 MG PO TABS: 5.0000 mg | ORAL_TABLET | Freq: Every day | ORAL | 3 refills | Status: AC

## 2024-08-16 NOTE — Patient Instructions (Signed)
 Medication Instructions:  Decrease Lisinopril  to 5 mg daily *If you need a refill on your cardiac medications before your next appointment, please call your pharmacy*  Lab Work: None ordered If you have labs (blood work) drawn today and your tests are completely normal, you will receive your results only by: MyChart Message (if you have MyChart) OR A paper copy in the mail If you have any lab test that is abnormal or we need to change your treatment, we will call you to review the results.  Testing/Procedures: Your physician has requested that you have an echocardiogram in November. Echocardiography is a painless test that uses sound waves to create images of your heart. It provides your doctor with information about the size and shape of your heart and how well your heart's chambers and valves are working. This procedure takes approximately one hour. There are no restrictions for this procedure. Please do NOT wear cologne, perfume, aftershave, or lotions (deodorant is allowed). Please arrive 15 minutes prior to your appointment time.  Please note: We ask at that you not bring children with you during ultrasound (echo/ vascular) testing. Due to room size and safety concerns, children are not allowed in the ultrasound rooms during exams. Our front office staff cannot provide observation of children in our lobby area while testing is being conducted. An adult accompanying a patient to their appointment will only be allowed in the ultrasound room at the discretion of the ultrasound technician under special circumstances. We apologize for any inconvenience.   Follow-Up: At [facility], you and your health needs are our priority.  As part of our continuing mission to provide you with exceptional heart care, our providers are all part of one team.  This team includes your primary Cardiologist (physician) and Advanced Practice Providers or APPs (Physician Assistants and Nurse Practitioners) who  all work together to provide you with the care you need, when you need it.  Your next appointment:   1 year(s)  Provider:   Jerel Balding, MD    We recommend signing up for the patient portal called MyChart.  Sign up information is provided on this After Visit Summary.  MyChart is used to connect with patients for Virtual Visits (Telemedicine).  Patients are able to view lab/test results, encounter notes, upcoming appointments, etc.  Non-urgent messages can be sent to your provider as well.   To learn more about what you can do with MyChart, go to [email or web address].

## 2024-08-16 NOTE — Progress Notes (Signed)
 Patient ID: Brian Craig, male   DOB: 1951/03/04, 73 y.o.   MRN: [id number]     Cardiology Office Note    Date:  08/16/2024   ID:  Brian Craig, DOB March 12, 1951, MRN [id number]  PCP:  Gayle Saddie FALCON, PA-C  Cardiologist:   Jerel Balding, MD   No chief complaint on file.   History of Present Illness:  Brian Craig is a 73 y.o. male who presents for permanent atrial fibrillation, nonischemic cardiomyopathy, complete heart block status post AV node ablation, biventricular defibrillator check. He underwent a recent CRT-D generator check in August 2023, but due to high-voltage coil impedance out of range, tachy detection and therapies are off.  He is doing well.  He has no cardiovascular complaints.  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  After starting treatment with Jardiance  and Mounjaro he has been steadily losing weight.  As this has occurred, he has had repeated episodes of symptomatic hypotension.  He has cut back his lisinopril  to just 10 mg once daily and continues to have occasional dizzy spells.  His blood pressure today was 100/66.  He has not had actual syncope or falls.  He does not have lower extremity edema.  He denies orthopnea, PND, exertional dyspnea.  Eventually stopped taking Jardiance .  He is compliant with CPAP and denies any daytime hypersomnolence.  Metabolic control remains good with a hemoglobin A1c of 5.3% and LDL of 49, but he has a chronically low HDL, most recently measured at 30.  Most recent TSH normal 2.150.  He has defibrillator interrogation shows normal function.  He has had AV node ablation but does usually have a reliable escape rhythm around 45 bpm.  Presenting rhythm is ventricular paced (biventricular paced).  He has 99.2% biventricular pacing efficiency.  He has not had any episodes of high ventricular rates.  His ICD coil has a fracture and since he also has completely recovered LVEF, all his  tachycardia therapies are turned off.  For the same reason his thoracic impedance/OptiVol monitoring is not reliable.    In the past he had episodes of accelerated idioventricular rhythm and 2017 and 2018 but none recorded since his generator change out in August 2023.    Brian Craig has a history of severe dilated cardiomyopathy related to uncontrolled atrial fibrillation with rapid ventricular response that started in the setting of thyrotoxicosis. His left ventricular ejection fraction was as low as 17%. Cardioversion failed. Eventually underwent AV node ablation and received a biventricular pacemaker-defibrillator in 2007. He underwent a generator change out in 2012 (atrial lead Medtronic 5076, ICD lead Medtronic [phone] Sprint Quattro secure, LV lead Medtronic 4194 Attain bipolar, Medtronic Viva XT gen change May 2016). His left ventricular ejection fraction has improved substantially since AV node ablation. His most recent echocardiogram performed in August 2015 shows normal left ventricular systolic function. He remains in permanent atrial fibrillation.     Past Medical History:  Diagnosis Date   Atrial fibrillation (HCC)    CHF (congestive heart failure) (HCC)    Graves disease    Graves   Hyperlipidemia    Hypertension    OSA treated with BiPAP    Substance abuse (HCC)    alcohol    Past Surgical History:  Procedure Laterality Date   APPENDECTOMY     BIV ICD GENERATOR CHANGEOUT N/A 08/11/2022   Procedure: BIV ICD GENERATOR CHANGEOUT;  Surgeon: Fernande Elspeth BROCKS, MD;  Location: [facility] INVASIVE  CV LAB;  Service: Cardiovascular;  Laterality: N/A;   BIV ICD GENERTAOR CHANGE OUT Left 2012   Medtronic Protecta   CARDIAC ELECTROPHYSIOLOGY STUDY AND ABLATION     EP IMPLANTABLE DEVICE N/A 05/06/2015   Procedure: ICD Generator Changeout;  Surgeon: Jerel Balding, MD;  Location: [facility] INVASIVE CV LAB;  Service: Cardiovascular;  Laterality: N/A;   HERNIA REPAIR     MENISCUS REPAIR Right    VASECTOMY      unilateral due to injury    Outpatient Medications Prior to Visit  Medication Sig Dispense Refill   acetaminophen  (TYLENOL ) 500 MG tablet Take 1,000 mg by mouth every 6 (six) hours as needed for moderate pain.     ALPRAZolam  (XANAX ) 0.5 MG tablet TAKE 1 TABLET TWICE A DAY AS NEEDED FOR ANXIETY 30 tablet 0   Carboxymethylcellulose Sodium (LUBRICANT EYE DROPS OP) Place 1 drop into both eyes at bedtime.     carvedilol  (COREG  CR) 80 MG 24 hr capsule TAKE 1 CAPSULE DAILY 90 capsule 3   cholecalciferol (VITAMIN D3) 25 MCG (1000 UNIT) tablet Take 1,000 Units by mouth daily.     furosemide  (LASIX ) 40 MG tablet TAKE 1 TABLET (40 MG) EVERY OTHER DAY ALTERNATING WITH 2 TABLETS (80 MG) EVERY OTHER DAY 135 tablet 3   levothyroxine (SYNTHROID) 125 MCG tablet Take 125 mcg by mouth daily before breakfast. Patient takes 2 tablets every morning before breakfast     Melatonin 5 MG CAPS Take 5 mg by mouth at bedtime.     metFORMIN (GLUCOPHAGE-XR) 500 MG 24 hr tablet Take 500 mg by mouth daily.     MOUNJARO 10 MG/0.5ML Pen Inject 10 mg into the skin once a week.     Multiple Vitamin (MULTIVITAMIN) tablet Take 1 tablet by mouth daily.     pantoprazole (PROTONIX) 40 MG tablet Take 40 mg by mouth daily.     potassium chloride  (KLOR-CON ) 10 MEQ tablet Take 1 tablet (10 mEq total) by mouth daily. 90 tablet 2   PRADAXA  150 MG CAPS capsule TAKE 1 CAPSULE TWICE DAILY 180 capsule 3   sertraline  (ZOLOFT ) 50 MG tablet TAKE 1 TABLET DAILY 90 tablet 3   lisinopril  (ZESTRIL ) 10 MG tablet TAKE 1 TABLET TWICE A DAY (Patient taking differently: Take 10 mg by mouth daily.) 180 tablet 2   SEMAGLUTIDE,0.25 OR 0.5MG /DOS, [place] Inject 0.25 mg into the skin once a week.     JARDIANCE  10 MG TABS tablet TAKE 1 TABLET DAILY BEFORE BREAKFAST 90 tablet 3   simvastatin  (ZOCOR ) 40 MG tablet TAKE 1 TABLET DAILY AT 6 P.M. (Patient not taking: Reported on 08/16/2024) 90 tablet 3   amoxicillin -clavulanate (AUGMENTIN ) 875-125 MG tablet Take 1 tablet  by mouth 2 (two) times daily. 14 tablet 0   No facility-administered medications prior to visit.     Allergies:   Patient has no known allergies.   Family History:  The patient's family history includes Alcohol abuse in his maternal grandfather and maternal uncle; Heart attack in his father; Heart disease in his father, mother, paternal aunt, and paternal uncle; Hyperlipidemia in his father; Hypertension in his father and mother.   ROS:   Please see the history of present illness.    ROS All other systems reviewed and are negative.   PHYSICAL EXAM:   VS:  BP 100/60 (BP Location: Left Arm, Patient Position: Sitting, Cuff Size: Large)   Pulse 88   Ht 6' (1.829 m)   Wt 267 lb 4.8 oz (121.2 kg)  SpO2 98%   BMI 36.25 kg/m      General: Alert, oriented x3, no distress, of the left subclavian ICD site Head: no evidence of trauma, PERRL, EOMI, no exophtalmos or lid lag, no myxedema, no xanthelasma; normal ears, nose and oropharynx Neck: normal jugular venous pulsations and no hepatojugular reflux; brisk carotid pulses without delay and no carotid bruits Chest: clear to auscultation, no signs of consolidation by percussion or palpation, normal fremitus, symmetrical and full respiratory excursions Cardiovascular: normal position and quality of the apical impulse, regular rhythm, normal first and second heart sounds, no murmurs, rubs or gallops Abdomen: no tenderness or distention, no masses by palpation, no abnormal pulsatility or arterial bruits, normal bowel sounds, no hepatosplenomegaly Extremities: no clubbing, cyanosis or edema; 2+ radial, ulnar and brachial pulses bilaterally; 2+ right femoral, posterior tibial and dorsalis pedis pulses; 2+ left femoral, posterior tibial and dorsalis pedis pulses; no subclavian or femoral bruits Neurological: grossly nonfocal Psych: Normal mood and affect    Wt Readings from Last 3 Encounters:  08/16/24 267 lb 4.8 oz (121.2 kg)  08/09/24 271 lb 4  oz (123 kg)  06/06/24 288 lb 3.2 oz (130.7 kg)      Studies/Labs Reviewed:   EKG:  EKG 06/06/2024 personally reviewed.  This shows background atrial fibrillation With ventricular paced rhythm.  Multiphasic QRS complex in lead V1 QRS duration 152 ms, QTc 495 ms.  Although the QRS complex does not have the positive waves in lead V1 that it had in the past, it still appears to have adequate biventricular resynchronization.  Recent Labs: 10/31/2023: TSH 2.150 05/02/2024: ALT 12; BUN 8; Creatinine, Ser 0.83; Hemoglobin 16.1; Platelets 231; Potassium 3.8; Sodium 140   Lipid Panel    Component Value Date/Time   CHOL 102 05/02/2024 0852   TRIG 129 05/02/2024 0852   HDL 30 (L) 05/02/2024 0852   CHOLHDL 3.4 05/02/2024 0852   CHOLHDL 3.6 02/09/2017 1103   VLDL 25 02/09/2017 1103   LDLCALC 49 05/02/2024 0852      ASSESSMENT:    1. CHB (complete heart block) (HCC)   2. Permanent atrial fibrillation (HCC)   3. Acquired thrombophilia (HCC)   4. Biventricular automatic implantable cardioverter defibrillator in situ   5. Chronic diastolic heart failure (HCC)   6. OSA on CPAP   7. Severe obesity (BMI 35.0-35.9 with comorbidity) (HCC)   8. Dyslipidemia (high LDL; low HDL)   9. Postablative hypothyroidism        PLAN:  In order of problems listed above:  1. CHB: s/p AV node ablation.  He has a usually reliable idioventricular escape rhythm, he should be considered pacemaker dependent. 2. AFib: Permanent arrhythmia.  CHA2DS2-VASc score 2 (CHF, age) no history of previous stroke or other embolic events.   3. Anticoagulation: Not had falls or bleeding problems.  Most recent hemoglobin 16.1. 4. CRT-D (programmed as CRT-P): Excellent biventricular pacing.  Cardi detection and therapies are turned off due to failure of the high-voltage circuit. 5. CHF: NYHA functional class I and fully recovered LVEF.  Have to decrease the dose of ACE inhibitor due to recurrent problems with hypotension as he  is losing weight.  Lisinopril  5 mg once daily.  He is clinically euvolemic on the current low-dose of diuretic and no needs to double the dose of furosemide  once every 2 or 3 months.   On ACE inhibitor and carvedilol .  Unlikely to tolerate higher doses since his blood pressure is low. On SGLT2 inhibitor.  Does  also take loop diuretics.  Appears to be euvolemic both clinically and by the thoracic impedance reading. 6. OSA on CPAP: He reports 100% CPAP compliance.  Denies daytime hypersomnolence. 7. Obesity/prediabetes: He has made tremendous progress with weight loss after starting GLP-1 agonist.  Congratulated.  I suspect he will continue to lose weight and we may have to decrease his blood pressure medications further. 8.  Dyslipidemia: Excellent LDL cholesterol on statin.  Typical pattern for insulin resistance.  Hopefully with the substantial weight loss we will now see improvement in HDL. 9. Hypothyroidism (post ablation): Recent TSH was normal. 10. Alcoholism in recovery: Remains abstinent   Medication Adjustments/Labs and Tests Ordered: Current medicines are reviewed at length with the patient today.  Concerns regarding medicines are outlined above.  Medication changes, Labs and Tests ordered today are listed in the Patient Instructions below. Patient Instructions  Medication Instructions:  Decrease Lisinopril  to 5 mg daily *If you need a refill on your cardiac medications before your next appointment, please call your pharmacy*  Lab Work: None ordered If you have labs (blood work) drawn today and your tests are completely normal, you will receive your results only by: MyChart Message (if you have MyChart) OR A paper copy in the mail If you have any lab test that is abnormal or we need to change your treatment, we will call you to review the results.  Testing/Procedures: Your physician has requested that you have an echocardiogram in November. Echocardiography is a painless test that  uses sound waves to create images of your heart. It provides your doctor with information about the size and shape of your heart and how well your heart's chambers and valves are working. This procedure takes approximately one hour. There are no restrictions for this procedure. Please do NOT wear cologne, perfume, aftershave, or lotions (deodorant is allowed). Please arrive 15 minutes prior to your appointment time.  Please note: We ask at that you not bring children with you during ultrasound (echo/ vascular) testing. Due to room size and safety concerns, children are not allowed in the ultrasound rooms during exams. Our front office staff cannot provide observation of children in our lobby area while testing is being conducted. An adult accompanying a patient to their appointment will only be allowed in the ultrasound room at the discretion of the ultrasound technician under special circumstances. We apologize for any inconvenience.   Follow-Up: At [facility], you and your health needs are our priority.  As part of our continuing mission to provide you with exceptional heart care, our providers are all part of one team.  This team includes your primary Cardiologist (physician) and Advanced Practice Providers or APPs (Physician Assistants and Nurse Practitioners) who all work together to provide you with the care you need, when you need it.  Your next appointment:   1 year(s)  Provider:   Jerel Balding, MD    We recommend signing up for the patient portal called MyChart.  Sign up information is provided on this After Visit Summary.  MyChart is used to connect with patients for Virtual Visits (Telemedicine).  Patients are able to view lab/test results, encounter notes, upcoming appointments, etc.  Non-urgent messages can be sent to your provider as well.   To learn more about what you can do with MyChart, go to [email or web address].           Signed, Jerel Balding, MD   08/16/2024 4:53 PM    [facility] [phone]  [address] [place], [place]  [address] Phone: [phone]; Fax: [phone]

## 2024-08-27 NOTE — Progress Notes (Signed)
Remote ICD Transmission.

## 2024-08-29 ENCOUNTER — Other Ambulatory Visit: Payer: Self-pay | Admitting: Cardiovascular Disease

## 2024-09-13 ENCOUNTER — Other Ambulatory Visit: Payer: Self-pay | Admitting: Cardiovascular Disease

## 2024-09-13 DIAGNOSIS — F50812 Binge eating disorder, severe: Secondary | ICD-10-CM | POA: Diagnosis not present

## 2024-09-13 DIAGNOSIS — K219 Gastro-esophageal reflux disease without esophagitis: Secondary | ICD-10-CM | POA: Diagnosis not present

## 2024-09-13 DIAGNOSIS — F411 Generalized anxiety disorder: Secondary | ICD-10-CM | POA: Diagnosis not present

## 2024-09-13 DIAGNOSIS — G4733 Obstructive sleep apnea (adult) (pediatric): Secondary | ICD-10-CM | POA: Diagnosis not present

## 2024-09-13 DIAGNOSIS — F331 Major depressive disorder, recurrent, moderate: Secondary | ICD-10-CM | POA: Diagnosis not present

## 2024-09-13 DIAGNOSIS — E119 Type 2 diabetes mellitus without complications: Secondary | ICD-10-CM | POA: Diagnosis not present

## 2024-09-14 NOTE — Telephone Encounter (Signed)
 Prescription refill request for Pradaxa  received.  Indication:afib Last office visit:9/25 Weight:121.2  kg Age:73 Scr:0.83  5/25 CrCl:137.91  ml/min  Prescription refilled

## 2024-10-15 ENCOUNTER — Other Ambulatory Visit: Payer: Self-pay | Admitting: Cardiovascular Disease

## 2024-10-16 ENCOUNTER — Ambulatory Visit (HOSPITAL_COMMUNITY)
Admission: RE | Admit: 2024-10-16 | Discharge: 2024-10-16 | Disposition: A | Discharge status: Home / Self Care | Source: Ambulatory Visit | Attending: Cardiology | Admitting: Cardiology

## 2024-10-16 DIAGNOSIS — I2510 Atherosclerotic heart disease of native coronary artery without angina pectoris: Secondary | ICD-10-CM | POA: Diagnosis not present

## 2024-10-16 DIAGNOSIS — I5032 Chronic diastolic (congestive) heart failure: Secondary | ICD-10-CM | POA: Diagnosis not present

## 2024-10-16 LAB — ECHOCARDIOGRAM COMPLETE
Area-P 1/2: 4.85 cm2
P 1/2 time: 511 ms
S' Lateral: 3.1 cm

## 2024-10-16 MED ORDER — PERFLUTREN LIPID MICROSPHERE
3.0000 mL | INTRAVENOUS | Status: AC | PRN
  Administered 2024-10-16: 3 mL via INTRAVENOUS

## 2024-10-17 ENCOUNTER — Ambulatory Visit: Payer: Self-pay | Admitting: Cardiovascular Disease

## 2024-10-24 ENCOUNTER — Other Ambulatory Visit: Payer: Self-pay

## 2024-10-24 DIAGNOSIS — F419 Anxiety disorder, unspecified: Secondary | ICD-10-CM

## 2024-11-07 ENCOUNTER — Ambulatory Visit: Payer: Medicare Other

## 2024-11-07 DIAGNOSIS — I5032 Chronic diastolic (congestive) heart failure: Secondary | ICD-10-CM

## 2024-11-07 LAB — CUP PACEART REMOTE DEVICE CHECK
Battery Remaining Longevity: 58 mo
Battery Voltage: 2.98 V
Brady Statistic AP VP Percent: 0 %
Brady Statistic AP VS Percent: 0 %
Brady Statistic AS VP Percent: 0 %
Brady Statistic AS VS Percent: 0 %
Brady Statistic RA Percent Paced: 0 %
Brady Statistic RV Percent Paced: 99.22 %
Date Time Interrogation Session: 20251126043724
HighPow Impedance: 79 Ohm
HighPow Impedance: 79 Ohm
Implantable Lead Connection Status: 753985
Implantable Lead Connection Status: 753985
Implantable Lead Connection Status: 753985
Implantable Lead Implant Date: 20071217
Implantable Lead Implant Date: 20071217
Implantable Lead Implant Date: 20071217
Implantable Lead Location: 753858
Implantable Lead Location: 753859
Implantable Lead Location: 753860
Implantable Lead Model: 4194
Implantable Lead Model: 5076
Implantable Lead Model: 6947
Implantable Pulse Generator Implant Date: 20230830
Lead Channel Impedance Value: 247 Ohm
Lead Channel Impedance Value: 361 Ohm
Lead Channel Impedance Value: 399 Ohm
Lead Channel Impedance Value: 399 Ohm
Lead Channel Impedance Value: 532 Ohm
Lead Channel Impedance Value: 665 Ohm
Lead Channel Pacing Threshold Amplitude: 0.875 V
Lead Channel Pacing Threshold Amplitude: 0.875 V
Lead Channel Pacing Threshold Pulse Width: 0.4 ms
Lead Channel Pacing Threshold Pulse Width: 0.8 ms
Lead Channel Setting Pacing Amplitude: 2.5 V
Lead Channel Setting Pacing Amplitude: 2.5 V
Lead Channel Setting Pacing Pulse Width: 0.4 ms
Lead Channel Setting Pacing Pulse Width: 0.8 ms
Lead Channel Setting Sensing Sensitivity: 0.3 mV

## 2024-11-13 DIAGNOSIS — F331 Major depressive disorder, recurrent, moderate: Secondary | ICD-10-CM | POA: Diagnosis not present

## 2024-11-13 DIAGNOSIS — E119 Type 2 diabetes mellitus without complications: Secondary | ICD-10-CM | POA: Diagnosis not present

## 2024-11-13 DIAGNOSIS — G4733 Obstructive sleep apnea (adult) (pediatric): Secondary | ICD-10-CM | POA: Diagnosis not present

## 2024-11-13 DIAGNOSIS — K219 Gastro-esophageal reflux disease without esophagitis: Secondary | ICD-10-CM | POA: Diagnosis not present

## 2024-11-13 DIAGNOSIS — F411 Generalized anxiety disorder: Secondary | ICD-10-CM | POA: Diagnosis not present

## 2024-11-13 DIAGNOSIS — F50812 Binge eating disorder, severe: Secondary | ICD-10-CM | POA: Diagnosis not present

## 2024-11-13 NOTE — Progress Notes (Signed)
 Remote ICD Transmission

## 2024-11-14 ENCOUNTER — Ambulatory Visit: Payer: Self-pay | Admitting: Cardiovascular Disease

## 2024-11-15 ENCOUNTER — Other Ambulatory Visit: Payer: Self-pay

## 2024-11-15 DIAGNOSIS — E7800 Pure hypercholesterolemia, unspecified: Secondary | ICD-10-CM

## 2024-11-15 DIAGNOSIS — I10 Essential (primary) hypertension: Secondary | ICD-10-CM

## 2024-11-15 DIAGNOSIS — R7303 Prediabetes: Secondary | ICD-10-CM

## 2024-11-16 ENCOUNTER — Emergency Department (HOSPITAL_COMMUNITY)
Admission: EM | Admit: 2024-11-16 | Discharge: 2024-11-17 | Attending: Emergency Medicine | Admitting: Emergency Medicine

## 2024-11-16 ENCOUNTER — Encounter (HOSPITAL_COMMUNITY): Payer: Self-pay

## 2024-11-16 ENCOUNTER — Emergency Department (HOSPITAL_COMMUNITY)

## 2024-11-16 ENCOUNTER — Other Ambulatory Visit: Payer: Self-pay

## 2024-11-16 DIAGNOSIS — Z5321 Procedure and treatment not carried out due to patient leaving prior to being seen by health care provider: Secondary | ICD-10-CM | POA: Insufficient documentation

## 2024-11-16 DIAGNOSIS — M545 Low back pain, unspecified: Secondary | ICD-10-CM | POA: Diagnosis not present

## 2024-11-16 DIAGNOSIS — M48061 Spinal stenosis, lumbar region without neurogenic claudication: Secondary | ICD-10-CM | POA: Diagnosis not present

## 2024-11-16 DIAGNOSIS — M5136 Other intervertebral disc degeneration, lumbar region with discogenic back pain only: Secondary | ICD-10-CM | POA: Diagnosis not present

## 2024-11-16 DIAGNOSIS — M4317 Spondylolisthesis, lumbosacral region: Secondary | ICD-10-CM | POA: Diagnosis not present

## 2024-11-16 NOTE — ED Triage Notes (Addendum)
 Pt BIB [facility] from home.Yesterday pt was at a funeral and did more walking than usual. Last PM pt started having lumbar pain that radiates to his abd and it is worsened when he lift his R leg. Denies urinary/fecal incontinence or saddle anesthesia.   EMS Vitals   104/62 RR 16 SpO2 98% on RA HR 86

## 2024-11-16 NOTE — ED Notes (Signed)
 Patient's family member stated they looked at mychart, and has decided to leave.

## 2024-11-16 NOTE — ED Provider Triage Note (Signed)
 Emergency Medicine Provider Triage Evaluation Note  Brian Craig , a 73 y.o. male  was evaluated in triage.  Pt complains of right sided back pain since last night. The patient reports that he spent the day standing and walking at a funerla and had pain afterwards. Denies any red flag symptoms.  Review of Systems  Positive:  Negative:   Physical Exam  BP 110/63 (BP Location: Left Arm)   Pulse 70   Temp 98.1 F (36.7 C) (Oral)   Resp 14   Ht 6' (1.829 m)   Wt 112.9 kg   SpO2 100%   BMI 33.77 kg/m  Gen:   Awake, no distress   Resp:  Normal effort  MSK:   Moves extremities without difficulty  Other:  Palpable distal pulses. Some lumbar midline and right paraspinal tenderness to palpation   Medical Decision Making  Medically screening exam initiated at 4:27 PM.  Appropriate orders placed.  Brian Craig was informed that the remainder of the evaluation will be completed by another provider, this initial triage assessment does not replace that evaluation, and the importance of remaining in the ED until their evaluation is complete.  XR ordered   Bernis Ernst, PA-C 11/16/24 1628

## 2024-11-19 ENCOUNTER — Other Ambulatory Visit: Payer: Self-pay

## 2024-11-19 DIAGNOSIS — M5136 Other intervertebral disc degeneration, lumbar region with discogenic back pain only: Secondary | ICD-10-CM

## 2024-11-19 DIAGNOSIS — M4316 Spondylolisthesis, lumbar region: Secondary | ICD-10-CM

## 2024-11-19 MED ORDER — TRAMADOL HCL 50 MG PO TABS: 50.0000 mg | ORAL_TABLET | Freq: Three times a day (TID) | ORAL | 0 refills | Status: AC | PRN

## 2024-11-22 ENCOUNTER — Other Ambulatory Visit: Payer: Self-pay | Admitting: Cardiovascular Disease

## 2024-11-22 ENCOUNTER — Other Ambulatory Visit

## 2024-11-22 DIAGNOSIS — E7800 Pure hypercholesterolemia, unspecified: Secondary | ICD-10-CM | POA: Diagnosis not present

## 2024-11-22 DIAGNOSIS — R7303 Prediabetes: Secondary | ICD-10-CM

## 2024-11-22 DIAGNOSIS — I10 Essential (primary) hypertension: Secondary | ICD-10-CM | POA: Diagnosis not present

## 2024-11-23 LAB — LIPID PANEL
Chol/HDL Ratio: 3 ratio (ref 0.0–5.0)
Cholesterol, Total: 90 mg/dL — ABNORMAL LOW (ref 100–199)
HDL: 30 mg/dL — ABNORMAL LOW (ref >39)
LDL Chol Calc (NIH): 40 mg/dL (ref 0–99)
Triglycerides: 105 mg/dL (ref 0–149)
VLDL Cholesterol Cal: 20 mg/dL (ref 5–40)

## 2024-11-23 LAB — CBC WITH DIFFERENTIAL/PLATELET
Basophils Absolute: 0.1 x10E3/uL (ref 0.0–0.2)
Basos: 1 %
EOS (ABSOLUTE): 0.2 x10E3/uL (ref 0.0–0.4)
Eos: 3 %
Hematocrit: 44.9 % (ref 37.5–51.0)
Hemoglobin: 15 g/dL (ref 13.0–17.7)
Immature Grans (Abs): 0 x10E3/uL (ref 0.0–0.1)
Immature Granulocytes: 0 %
Lymphocytes Absolute: 2.8 x10E3/uL (ref 0.7–3.1)
Lymphs: 39 %
MCH: 32.8 pg (ref 26.6–33.0)
MCHC: 33.4 g/dL (ref 31.5–35.7)
MCV: 98 fL — ABNORMAL HIGH (ref 79–97)
Monocytes Absolute: 0.7 x10E3/uL (ref 0.1–0.9)
Monocytes: 9 %
Neutrophils Absolute: 3.5 x10E3/uL (ref 1.4–7.0)
Neutrophils: 48 %
Platelets: 228 x10E3/uL (ref 150–450)
RBC: 4.57 x10E6/uL (ref 4.14–5.80)
RDW: 11.9 % (ref 11.6–15.4)
WBC: 7.3 x10E3/uL (ref 3.4–10.8)

## 2024-11-23 LAB — COMPREHENSIVE METABOLIC PANEL WITH GFR
ALT: 12 IU/L (ref 0–44)
AST: 16 IU/L (ref 0–40)
Albumin: 4.2 g/dL (ref 3.8–4.8)
Alkaline Phosphatase: 86 IU/L (ref 47–123)
BUN/Creatinine Ratio: 14 (ref 10–24)
BUN: 12 mg/dL (ref 8–27)
Bilirubin Total: 0.6 mg/dL (ref 0.0–1.2)
CO2: 27 mmol/L (ref 20–29)
Calcium: 9.6 mg/dL (ref 8.6–10.2)
Chloride: 98 mmol/L (ref 96–106)
Creatinine, Ser: 0.84 mg/dL (ref 0.76–1.27)
Globulin, Total: 2.2 g/dL (ref 1.5–4.5)
Glucose: 86 mg/dL (ref 70–99)
Potassium: 4 mmol/L (ref 3.5–5.2)
Sodium: 137 mmol/L (ref 134–144)
Total Protein: 6.4 g/dL (ref 6.0–8.5)
eGFR: 92 mL/min/1.73 (ref >59)

## 2024-11-23 LAB — HEMOGLOBIN A1C
Est. average glucose Bld gHb Est-mCnc: 100 mg/dL
Hgb A1c MFr Bld: 5.1 % (ref 4.8–5.6)

## 2024-11-25 ENCOUNTER — Ambulatory Visit: Payer: Self-pay

## 2024-11-29 ENCOUNTER — Ambulatory Visit

## 2024-11-29 VITALS — BP 110/73 | HR 81 | Temp 97.6°F | Ht 72.0 in | Wt 250.1 lb

## 2024-11-29 DIAGNOSIS — E7800 Pure hypercholesterolemia, unspecified: Secondary | ICD-10-CM | POA: Diagnosis not present

## 2024-11-29 DIAGNOSIS — I420 Dilated cardiomyopathy: Secondary | ICD-10-CM | POA: Diagnosis not present

## 2024-11-29 DIAGNOSIS — I4821 Permanent atrial fibrillation: Secondary | ICD-10-CM | POA: Diagnosis not present

## 2024-11-29 DIAGNOSIS — I10 Essential (primary) hypertension: Secondary | ICD-10-CM

## 2024-11-29 DIAGNOSIS — I442 Atrioventricular block, complete: Secondary | ICD-10-CM | POA: Diagnosis not present

## 2024-11-29 DIAGNOSIS — J439 Emphysema, unspecified: Secondary | ICD-10-CM | POA: Diagnosis not present

## 2024-11-29 DIAGNOSIS — I5042 Chronic combined systolic (congestive) and diastolic (congestive) heart failure: Secondary | ICD-10-CM

## 2024-11-29 NOTE — Progress Notes (Signed)
 Established Patient Office Visit  Subjective   Patient ID: Brian Craig, male    DOB: 1951/11/24  Age: 73 y.o. MRN: [id number]  Chief Complaint  Patient presents with   Medical Management of Chronic Issues    HPI  Discussed the use of AI scribe software for clinical note transcription with the patient, who gave verbal consent to proceed.  History of Present Illness   Brian Craig is a 72 year old male who presents for a six-month checkup and medication refills.  Hyperlipidemia and statin therapy - Hyperlipidemia managed with simvastatin . - Cholesterol levels below normal on recent laboratory testing. Patient reports that when he saw this he stopped taking the cholesterol medication last week.   Weight loss and dietary modification - Significant weight loss from 360 pounds to 250 pounds over an extended period since starting diet and Mounjaro. - Weight loss attributed to reduced food intake and improved dietary habits. - Breakfast consists of 'oats overnight' and supper described as healthy.       ROS Per HPI.    Objective:     BP 110/73   Pulse 81   Temp 97.6 F (36.4 C) (Oral)   Ht 6' (1.829 m)   Wt 250 lb 1.9 oz (113.5 kg)   SpO2 99%   BMI 33.92 kg/m    Physical Exam Constitutional:      General: He is not in acute distress.    Appearance: Normal appearance.  Eyes:     Pupils: Pupils are equal, round, and reactive to light.  Cardiovascular:     Rate and Rhythm: Normal rate and regular rhythm.     Heart sounds: Normal heart sounds. No murmur heard.    No friction rub. No gallop.  Pulmonary:     Effort: Pulmonary effort is normal. No respiratory distress.     Breath sounds: Normal breath sounds.  Abdominal:     General: Bowel sounds are normal.  Musculoskeletal:        General: No swelling.     Cervical back: Neck supple.  Lymphadenopathy:     Cervical: No cervical adenopathy.  Skin:    General: Skin is warm and dry.  Neurological:      General: No focal deficit present.     Mental Status: He is alert.  Psychiatric:        Mood and Affect: Mood normal.        Behavior: Behavior normal.        Thought Content: Thought content normal.      No results found for any visits on 11/29/24.  Last CBC Lab Results  Component Value Date   WBC 7.3 11/22/2024   HGB 15.0 11/22/2024   HCT 44.9 11/22/2024   MCV 98 (H) 11/22/2024   MCH 32.8 11/22/2024   RDW 11.9 11/22/2024   PLT 228 11/22/2024   Last metabolic panel Lab Results  Component Value Date   GLUCOSE 86 11/22/2024   NA 137 11/22/2024   K 4.0 11/22/2024   CL 98 11/22/2024   CO2 27 11/22/2024   BUN 12 11/22/2024   CREATININE 0.84 11/22/2024   EGFR 92 11/22/2024   CALCIUM  9.6 11/22/2024   PROT 6.4 11/22/2024   ALBUMIN 4.2 11/22/2024   LABGLOB 2.2 11/22/2024   AGRATIO 1.5 04/12/2023   BILITOT 0.6 11/22/2024   ALKPHOS 86 11/22/2024   AST 16 11/22/2024   ALT 12 11/22/2024   Last lipids Lab Results  Component Value Date   CHOL 90 (  L) 11/22/2024   HDL 30 (L) 11/22/2024   LDLCALC 40 11/22/2024   TRIG 105 11/22/2024   CHOLHDL 3.0 11/22/2024   Last hemoglobin A1c Lab Results  Component Value Date   HGBA1C 5.1 11/22/2024   Last thyroid  functions Lab Results  Component Value Date   TSH 2.150 10/31/2023   T3TOTAL 84 06/30/2021   T4TOTAL 9.0 10/31/2023   FREET4 1.29 04/12/2023   Last vitamin D  Lab Results  Component Value Date   VD25OH 38.0 03/15/2022      The ASCVD Risk score (Arnett DK, et al., 2019) failed to calculate for the following reasons:   The valid total cholesterol range is 130 to 320 mg/dL    Assessment & Plan:   Pure hypercholesterolemia Assessment & Plan: Last lipid panel: LDL 40, HDL 30, Trig 105. Patient would like to do a trial of cutting the Simvastatin  in half for a total of 20 mg daily. Discussed that we will recheck cholesterol panel in 3 months and if LDL above goal, we will go back to 40 mg daily.   Orders: -      Lipid panel; Future  Pulmonary emphysema (HCC)  Permanent atrial fibrillation (HCC) Assessment & Plan: Followed by cards, on dabigatran  150mg  BID    Essential hypertension Assessment & Plan: Pressure goal <130/80. Blood pressure stable and at goal. Continue carvedilol  80 mg daily and lisinopril  10 mg daily. Continue furosemide  40 mg and Jardiance  10 mg daily. Followed by cardiology.    Emphysema lung (HCC) Assessment & Plan: Patient is still smoking 0.5 ppd. Denies SOB or wheeze. First noted on CT scan May 2021. Not ready to quit yet.    Congestive dilated cardiomyopathy [facility]) Assessment & Plan: Followed by cardiology.    Chronic combined systolic and diastolic CHF, NYHA class 1 (HCC) Assessment & Plan: Followed by cardiology.    CHB (complete heart block) (HCC) Assessment & Plan: -Followed by cardiology. -S/p ablation, has pacemaker.    Return in about 6 months (around 05/30/2025) for HTN, DM, HLD.    Saddie JULIANNA Sacks, PA-C

## 2024-11-29 NOTE — Assessment & Plan Note (Signed)
 Followed by cardiology

## 2024-11-29 NOTE — Patient Instructions (Signed)
 VISIT SUMMARY: Today, you had your six-month checkup and medication refills. We discussed your cholesterol management, weight loss progress, and diabetes control. Your recent lab results were reviewed, and your medications were adjusted accordingly.  YOUR PLAN: HYPERLIPIDEMIA: Your cholesterol levels are well-controlled with simvastatin , even after reducing the dose. -Continue taking simvastatin  at a reduced dose of 20 mg daily. -Recheck your cholesterol levels in three months. -Schedule a follow-up appointment in six months.  OBESITY: You have successfully lost weight through diet, Mounjaro, and lifestyle changes. -Continue taking Mounjaro and maintaining a healthy diet. -Keep up with physical activity and healthy eating habits.  GENERAL HEALTH MAINTENANCE: Your routine health maintenance is up to date, including lung cancer screening and colonoscopies. -Continue with routine health maintenance screenings. -Consider getting tetanus, flu, and shingles vaccines at the pharmacy if you wish.  If you have any problems before your next visit feel free to message me via MyChart (minor issues or questions) or call the office, otherwise you may reach out to schedule an office visit.  Thank you! Saddie Sacks, PA-C

## 2024-11-29 NOTE — Assessment & Plan Note (Signed)
 Pressure goal <130/80. Blood pressure stable and at goal. Continue carvedilol  80 mg daily and lisinopril  10 mg daily. Continue furosemide  40 mg and Jardiance  10 mg daily. Followed by cardiology.

## 2024-11-29 NOTE — Assessment & Plan Note (Signed)
 Last lipid panel: LDL 40, HDL 30, Trig 105. Patient would like to do a trial of cutting the Simvastatin  in half for a total of 20 mg daily. Discussed that we will recheck cholesterol panel in 3 months and if LDL above goal, we will go back to 40 mg daily.

## 2024-11-29 NOTE — Assessment & Plan Note (Signed)
 Followed by cards, on dabigatran  150mg  BID

## 2024-11-29 NOTE — Assessment & Plan Note (Signed)
-  Followed by cardiology. ?-S/p ablation, has pacemaker. ?

## 2024-11-29 NOTE — Assessment & Plan Note (Addendum)
 Patient is still smoking 0.5 ppd. Denies SOB or wheeze. First noted on CT scan May 2021. Not ready to quit yet.

## 2024-12-17 ENCOUNTER — Other Ambulatory Visit (INDEPENDENT_AMBULATORY_CARE_PROVIDER_SITE_OTHER)

## 2024-12-17 ENCOUNTER — Ambulatory Visit (INDEPENDENT_AMBULATORY_CARE_PROVIDER_SITE_OTHER): Admitting: Orthopedic Surgery

## 2024-12-17 ENCOUNTER — Encounter: Payer: Self-pay | Admitting: Orthopedic Surgery

## 2024-12-17 VITALS — BP 109/69 | HR 86 | Ht 72.0 in | Wt 251.0 lb

## 2024-12-17 DIAGNOSIS — M4306 Spondylolysis, lumbar region: Secondary | ICD-10-CM

## 2024-12-17 DIAGNOSIS — Z720 Tobacco use: Secondary | ICD-10-CM | POA: Diagnosis not present

## 2024-12-17 NOTE — Progress Notes (Signed)
 Orthopedic Spine Surgery Office Note  Assessment: Patient is a 74 y.o. male with acute episode of low back pain that has resolved with medication.  Has bilateral pars defects and a spondylolisthesis at L5/S1   Plan: -Since patient is currently asymptomatic, did not recommend any further intervention.  If pain returns, would try a short course of narcotics and Tylenol  since that works for him this last episode -Patient has tried Tylenol , narcotics -Would need to be nicotine  free prior to any elective spine surgery -Patient should return to the office on an as needed basis   I spent talking to the patient about the health risks associated with smoking and encouraged cessation. Explained that from a surgical perspective, smoking increases the risk of complication. Patient expressed understanding and patient is not interested in quitting at this time.    ___________________________________________________________________________   History:  Patient is a 73 y.o. male who presents today for lumbar spine.  Patient had significant low back pain around Christmas.  There was no trauma or injury that preceded the onset of the pain.  He was given some pain medication and took it easy for a few days.  His pain then resolved.  Today, he is having no pain in his back.  Is not having any radiating leg pain.   Weakness: Denies Symptoms of imbalance: Denies Paresthesias and numbness: Denies Bowel or bladder incontinence: Denies Saddle anesthesia: Denies  Treatments tried: Tylenol , narcotics  Review of systems: Denies fevers and chills, night sweats, unexplained weight loss, history of cancer, pain that wakes him at night  Past medical history: HTN HLD Anxiety Atrial fibrillation OSA CHF  Allergies: NKDA  Past surgical history:  Appendectomy Hernia repair Meniscus repair Vasectomy Pacemaker placement  Social history: Reports use of nicotine  product (smoking, vaping, patches,  smokeless) Alcohol use: Denies Denies recreational drug use   Physical Exam:  BMI of 34.0  General: no acute distress, appears stated age Neurologic: alert, answering questions appropriately, following commands Respiratory: unlabored breathing on room air, symmetric chest rise Psychiatric: appropriate affect, normal cadence to speech   MSK (spine):  -Strength exam      Left  Right EHL    5/5  5/5 TA    5/5  5/5 GSC    5/5  5/5 Knee extension  5/5  5/5 Hip flexion   5/5  5/5  -Sensory exam    Sensation intact to light touch in L3-S1 nerve distributions of bilateral lower extremities  -Achilles DTR: 2/4 on the left, 2/4 on the right -Patellar tendon DTR: 2/4 on the left, 2/4 on the right  -Straight leg raise: Negative bilaterally -Clonus: no beats bilaterally  -Left hip exam: No pain through range of motion -Right hip exam: No pain through range of motion  Imaging: XRs of the lumbar spine from 12/17/2024 were independently reviewed and interpreted, showing disc height loss at L5/S1.  Bilateral pars defects with a spondylolisthesis at L5/S1.  No other significant degenerative changes seen.  No fracture or dislocation seen.   Patient name: Brian Craig Patient MRN: [id number] Date of visit: 12/17/2024

## 2024-12-28 ENCOUNTER — Other Ambulatory Visit: Payer: Self-pay

## 2024-12-28 DIAGNOSIS — F419 Anxiety disorder, unspecified: Secondary | ICD-10-CM

## 2025-01-03 ENCOUNTER — Ambulatory Visit: Payer: Medicare Other

## 2025-01-03 VITALS — Ht 73.0 in | Wt 238.6 lb

## 2025-01-03 DIAGNOSIS — Z0000 Encounter for general adult medical examination without abnormal findings: Secondary | ICD-10-CM

## 2025-01-03 NOTE — Patient Instructions (Addendum)
 Brian Craig,  Thank you for taking the time for your Medicare Wellness Visit. I appreciate your continued commitment to your health goals. Please review the care plan we discussed, and feel free to reach out if I can assist you further.  Please note that Annual Wellness Visits do not include a physical exam. Some assessments may be limited, especially if the visit was conducted virtually. If needed, we may recommend an in-person follow-up with your provider.  Ongoing Care Seeing your primary care provider every 3 to 6 months helps us  monitor your health and provide consistent, personalized care.   Referrals If a referral was made during today's visit and you haven't received any updates within two weeks, please contact the referred provider directly to check on the status.  Recommended Screenings:  Health Maintenance  Topic Date Due   Colon Cancer Screening  Never done   COVID-19 Vaccine (4 - 2025-26 season) 08/13/2024   Zoster (Shingles) Vaccine (1 of 2) 02/27/2025*   Flu Shot  03/12/2025*   DTaP/Tdap/Td vaccine (1 - Tdap) 11/29/2025*   Screening for Lung Cancer  06/08/2025   Medicare Annual Wellness Visit  01/03/2026   Pneumococcal Vaccine for age over 84  Completed   Hepatitis C Screening  Completed   Meningitis B Vaccine  Aged Out  *Topic was postponed. The date shown is not the original due date.       11/16/2024    4:15 PM  Advanced Directives  Does Patient Have a Medical Advance Directive? No    Vision: Annual vision screenings are recommended for early detection of glaucoma, cataracts, and diabetic retinopathy. These exams can also reveal signs of chronic conditions such as diabetes and high blood pressure.  Dental: Annual dental screenings help detect early signs of oral cancer, gum disease, and other conditions linked to overall health, including heart disease and diabetes.

## 2025-01-03 NOTE — Progress Notes (Signed)
 "  Chief Complaint  Patient presents with   Medicare Wellness     Subjective:   Brian Craig is a 73 y.o. male who presents for a Medicare Annual Wellness Visit.  Visit info / Clinical Intake: Medicare Wellness Visit Type:: Subsequent Annual Wellness Visit Persons participating in visit and providing information:: patient Medicare Wellness Visit Mode:: Video Since this visit was completed virtually, some vitals may be partially provided or unavailable. Missing vitals are due to the limitations of the virtual format.: Documented vitals are patient reported If Telephone or Video please confirm:: I connected with patient using audio/video enable telemedicine. I verified patient identity with two identifiers, discussed telehealth limitations, and patient agreed to proceed. Patient Location:: Home Provider Location:: Office Interpreter Needed?: No Pre-visit prep was completed: yes AWV questionnaire completed by patient prior to visit?: yes Date:: 12/28/24 Living arrangements:: lives with spouse/significant other Patient's Overall Health Status Rating: very good Typical amount of pain: some Does pain affect daily life?: no Are you currently prescribed opioids?: no  Dietary Habits and Nutritional Risks How many meals a day?: 2 Eats fruit and vegetables daily?: (!) no Most meals are obtained by: having others provide food; eating out In the last 2 weeks, have you had any of the following?: none Diabetic:: (!) yes Any non-healing wounds?: no How often do you check your BS?: 0 Would you like to be referred to a Nutritionist or for Diabetic Management? : no  Functional Status Activities of Daily Living (to include ambulation/medication): Independent Ambulation: Independent with device- listed below Home Assistive Devices/Equipment: Eyeglasses; CPAP Medication Administration: Independent Home Management (perform basic housework or laundry): Independent Manage your own finances?:  yes Primary transportation is: driving Concerns about vision?: no *vision screening is required for WTM* Concerns about hearing?: no  Fall Screening Falls in the past year?: 0 Number of falls in past year: 0 Was there an injury with Fall?: 0 Fall Risk Category Calculator: 0 Patient Fall Risk Level: Low Fall Risk  Fall Risk Patient at Risk for Falls Due to: No Fall Risks Fall risk Follow up: Falls evaluation completed; Falls prevention discussed  Home and Transportation Safety: All rugs have non-skid backing?: yes All stairs or steps have railings?: yes Grab bars in the bathtub or shower?: yes Have non-skid surface in bathtub or shower?: yes Good home lighting?: yes Regular seat belt use?: yes Hospital stays in the last year:: no  Cognitive Assessment Difficulty concentrating, remembering, or making decisions? : no Will 6CIT or Mini Cog be Completed: yes What year is it?: 0 points What month is it?: 0 points Give patient an address phrase to remember (5 components): [address] [place], [place] About what time is it?: 0 points Count backwards from 20 to 1: 0 points Say the months of the year in reverse: 0 points Repeat the address phrase from earlier: 0 points 6 CIT Score: 0 points  Advance Directives (For Healthcare) Does Patient Have a Medical Advance Directive?: No Would patient like information on creating a medical advance directive?: No - Patient declined  Reviewed/Updated  Reviewed/Updated: Reviewed All (Medical, Surgical, Family, Medications, Allergies, Care Teams, Patient Goals)    Allergies (verified) Patient has no known allergies.   Current Medications (verified) Outpatient Encounter Medications as of 01/03/2025  Medication Sig   acetaminophen  (TYLENOL ) 500 MG tablet Take 1,000 mg by mouth every 6 (six) hours as needed for moderate pain.   ALPRAZolam  (XANAX ) 0.5 MG tablet TAKE 1 TABLET TWICE A DAY AS NEEDED FOR ANXIETY  Carboxymethylcellulose Sodium  (LUBRICANT EYE DROPS OP) Place 1 drop into both eyes at bedtime.   carvedilol  (COREG  CR) 80 MG 24 hr capsule TAKE 1 CAPSULE DAILY   cholecalciferol (VITAMIN D3) 25 MCG (1000 UNIT) tablet Take 1,000 Units by mouth daily.   dabigatran  (PRADAXA ) 150 MG CAPS capsule TAKE 1 CAPSULE TWICE DAILY   furosemide  (LASIX ) 40 MG tablet TAKE 1 TABLET (40 MG) EVERY OTHER DAY ALTERNATING WITH 2 TABLETS (80 MG) EVERY OTHER DAY   levothyroxine (SYNTHROID) 125 MCG tablet Take 125 mcg by mouth daily before breakfast. Patient takes 2 tablets every morning before breakfast   lisinopril  (ZESTRIL ) 5 MG tablet Take 1 tablet (5 mg total) by mouth daily.   Melatonin 5 MG CAPS Take 5 mg by mouth at bedtime.   metFORMIN (GLUCOPHAGE-XR) 500 MG 24 hr tablet Take 500 mg by mouth daily.   MOUNJARO 10 MG/0.5ML Pen Inject 10 mg into the skin once a week.   Multiple Vitamin (MULTIVITAMIN) tablet Take 1 tablet by mouth daily.   pantoprazole (PROTONIX) 40 MG tablet Take 40 mg by mouth daily.   potassium chloride  (KLOR-CON ) 10 MEQ tablet TAKE 1 TABLET DAILY   sertraline  (ZOLOFT ) 50 MG tablet TAKE 1 TABLET DAILY   simvastatin  (ZOCOR ) 40 MG tablet TAKE 1 TABLET DAILY AT 6 P.M.   No facility-administered encounter medications on file as of 01/03/2025.    History: Past Medical History:  Diagnosis Date   Atrial fibrillation (HCC)    CHF (congestive heart failure) (HCC)    Graves disease    Graves   Hyperlipidemia    Hypertension    OSA treated with BiPAP    Substance abuse (HCC)    alcohol   Past Surgical History:  Procedure Laterality Date   APPENDECTOMY     BIV ICD GENERATOR CHANGEOUT N/A 08/11/2022   Procedure: BIV ICD GENERATOR CHANGEOUT;  Surgeon: Fernande Elspeth BROCKS, MD;  Location: [facility] INVASIVE CV LAB;  Service: Cardiovascular;  Laterality: N/A;   BIV ICD GENERTAOR CHANGE OUT Left 2012   Med[employer]onic Protecta   CARDIAC ELECTROPHYSIOLOGY STUDY AND ABLATION     EP IMPLANTABLE DEVICE N/A 05/06/2015   Procedure: ICD Generator  Changeout;  Surgeon: Jerel Balding, MD;  Location: [facility] INVASIVE CV LAB;  Service: Cardiovascular;  Laterality: N/A;   HERNIA REPAIR     MENISCUS REPAIR Right    VASECTOMY     unilateral due to injury   Family History  Problem Relation Age of Onset   Heart disease Mother    Hypertension Mother    Heart disease Father    Hyperlipidemia Father    Hypertension Father    Heart attack Father    Heart disease Paternal Aunt    Heart disease Paternal Uncle    Alcohol abuse Maternal Uncle    Alcohol abuse Maternal Grandfather    Social History   Occupational History   Not on file  Tobacco Use   Smoking status: Every Day    Current packs/day: 0.50    Average packs/day: 0.5 packs/day for 51.0 years (25.5 ttl pk-yrs)    Types: Cigarettes    Passive exposure: Never   Smokeless tobacco: Never   Tobacco comments:    Half of pack daily    08/16/2024 Patient smoke 1/2 pack daily  Vaping Use   Vaping status: Never Used  Substance and Sexual Activity   Alcohol use: Not on file    Comment: recovering alcoholic   Drug use: No   Sexual activity:  Yes    Birth control/protection: None   Tobacco Counseling Ready to quit: No Counseling given: Yes Tobacco comments: Half of pack daily 08/16/2024 Patient smoke 1/2 pack daily  SDOH Screenings   Food Insecurity: No Food Insecurity (01/03/2025)  Housing: Unknown (01/03/2025)  Transportation Needs: No Transportation Needs (01/03/2025)  Utilities: Not At Risk (01/03/2025)  Alcohol Screen: Low Risk (12/01/2023)  Depression (PHQ2-9): Low Risk (01/03/2025)  Financial Resource Strain: Low Risk (11/25/2024)  Physical Activity: Insufficiently Active (01/03/2025)  Social Connections: Socially Integrated (01/03/2025)  Stress: No Stress Concern Present (01/03/2025)  Tobacco Use: High Risk (01/03/2025)  Health Literacy: Adequate Health Literacy (01/03/2025)   See flowsheets for full screening details  Depression Screen PHQ 2 & 9 Depression Scale- Over the  past 2 weeks, how often have you been bothered by any of the following problems? Little interest or pleasure in doing things: 0 Feeling down, depressed, or hopeless (PHQ Adolescent also includes...irritable): 0 PHQ-2 Total Score: 0 Trouble falling or staying asleep, or sleeping too much: 0 Feeling tired or having little energy: 0 Poor appetite or overeating (PHQ Adolescent also includes...weight loss): 0 Feeling bad about yourself - or that you are a failure or have let yourself or your family down: 0 Trouble concentrating on things, such as reading the newspaper or watching television (PHQ Adolescent also includes...like school work): 0 Moving or speaking so slowly that other people could have noticed. Or the opposite - being so fidgety or restless that you have been moving around a lot more than usual: 0 Thoughts that you would be better off dead, or of hurting yourself in some way: 0 PHQ-9 Total Score: 0 If you checked off any problems, how difficult have these problems made it for you to do your work, take care of things at home, or get along with other people?: Not difficult at all  Depression Treatment Depression Interventions/Treatment : EYV7-0 Score <4 Follow-up Not Indicated; Medication; Currently on Treatment     Goals Addressed               This Visit's Progress     Patient Stated (pt-stated)        Patient stated he plans to continue staying busy and managing weight (lost 100lbs).  Want to lose about 25lbs more.             Objective:    Today's Vitals   01/03/25 0922  Weight: 238 lb 9.6 oz (108.2 kg)  Height: 6' 1 (1.854 m)   Body mass index is 31.48 kg/m.  Hearing/Vision screen Hearing Screening - Comments:: Denies hearing difficulties   Vision Screening - Comments:: Wears rx glasses - up to date with routine eye exams with [facility] Immunizations and Health Maintenance Health Maintenance  Topic Date Due   COVID-19 Vaccine (4 - 2025-26 season)  08/13/2024   Zoster Vaccines- Shingrix  (1 of 2) 02/27/2025 (Originally 10/22/1970)   Influenza Vaccine  03/12/2025 (Originally 07/13/2024)   DTaP/Tdap/Td (1 - Tdap) 11/29/2025 (Originally 10/22/1970)   Colonoscopy  01/03/2026 (Originally 10/22/1996)   Lung Cancer Screening  06/08/2025   Medicare Annual Wellness (AWV)  01/03/2026   Pneumococcal Vaccine: 50+ Years  Completed   Hepatitis C Screening  Completed   Meningococcal B Vaccine  Aged Out        Assessment/Plan:  This is a routine wellness examination for Brian Craig.  I have recommended that this patient have a Colonoscopy and Cologuard but he declines at this time. I have discussed the risks and  benefits of this procedure with him. The patient verbalizes understanding.   Patient Care Team: Gayle Saddie JULIANNA DEVONNA as PCP - General (Physician Assistant) Francyne Headland, MD as PCP - Cardiology (Cardiology) Jackquline Sawyer, MD (Dermatology) Octavia Bruckner, MD as Consulting Physician (Ophthalmology) Tobie Grange, MD as Referring Physician (Endocrinology) Croitoru, Headland, MD as Consulting Physician (Cardiology)  I have personally reviewed and noted the following in the patients chart:   Medical and social history Use of alcohol, tobacco or illicit drugs  Current medications and supplements including opioid prescriptions. Functional ability and status Nutritional status Physical activity Advanced directives List of other physicians Hospitalizations, surgeries, and ER visits in previous 12 months Vitals Screenings to include cognitive, depression, and falls Referrals and appointments  No orders of the defined types were placed in this encounter.  In addition, I have reviewed and discussed with patient certain preventive protocols, quality metrics, and best practice recommendations. A written personalized care plan for preventive services as well as general preventive health recommendations were provided to patient.   Brian Craig CHRISTELLA Saba, CMA   01/03/2025   Return in 1 year (on 01/03/2026).  After Visit Summary: (MyChart) Due to this being a telephonic visit, the after visit summary with patients personalized plan was offered to patient via MyChart   Nurse Notes: Pt declined Colonoscopy/Cologuard; scheduled 2027 AWV appt "

## 2025-02-06 ENCOUNTER — Ambulatory Visit: Payer: Medicare Other

## 2025-02-28 ENCOUNTER — Other Ambulatory Visit

## 2025-05-23 ENCOUNTER — Other Ambulatory Visit

## 2025-05-30 ENCOUNTER — Ambulatory Visit

## 2025-09-26 ENCOUNTER — Ambulatory Visit: Payer: Medicare Other | Admitting: Dermatology
